# Patient Record
Sex: Male | Born: 1985 | Race: Black or African American | Hispanic: No | Marital: Married | Smoking: Never smoker
Health system: Southern US, Community
[De-identification: ages and names within clinical notes are randomized; demographics above are authoritative.]

## PROBLEM LIST (undated history)

## (undated) DIAGNOSIS — G4730 Sleep apnea, unspecified: Secondary | ICD-10-CM

## (undated) DIAGNOSIS — R030 Elevated blood-pressure reading, without diagnosis of hypertension: Secondary | ICD-10-CM

## (undated) DIAGNOSIS — E139 Other specified diabetes mellitus without complications: Secondary | ICD-10-CM

## (undated) DIAGNOSIS — M549 Dorsalgia, unspecified: Secondary | ICD-10-CM

## (undated) DIAGNOSIS — Z91018 Allergy to other foods: Secondary | ICD-10-CM

## (undated) HISTORY — DX: Dorsalgia, unspecified: M54.9

## (undated) HISTORY — DX: Allergy to other foods: Z91.018

## (undated) HISTORY — DX: Elevated blood-pressure reading, without diagnosis of hypertension: R03.0

## (undated) HISTORY — DX: Other specified diabetes mellitus without complications: E13.9

## (undated) HISTORY — DX: Sleep apnea, unspecified: G47.30

---
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
## 2004-04-16 ENCOUNTER — Emergency Department (HOSPITAL_COMMUNITY): Admission: EM | Admit: 2004-04-16 | Discharge: 2004-04-16 | Payer: Self-pay | Admitting: Emergency Medicine

## 2004-09-08 ENCOUNTER — Inpatient Hospital Stay (HOSPITAL_COMMUNITY): Admission: EM | Admit: 2004-09-08 | Discharge: 2004-09-12 | Payer: Self-pay | Admitting: Emergency Medicine

## 2004-09-12 ENCOUNTER — Ambulatory Visit: Payer: Self-pay | Admitting: Internal Medicine

## 2004-09-17 ENCOUNTER — Ambulatory Visit: Payer: Self-pay | Admitting: *Deleted

## 2005-04-14 ENCOUNTER — Ambulatory Visit: Payer: Self-pay | Admitting: Internal Medicine

## 2006-02-18 ENCOUNTER — Ambulatory Visit: Payer: Self-pay | Admitting: Internal Medicine

## 2006-03-04 ENCOUNTER — Ambulatory Visit: Payer: Self-pay | Admitting: Internal Medicine

## 2006-05-01 ENCOUNTER — Ambulatory Visit: Payer: Self-pay | Admitting: Internal Medicine

## 2006-07-31 ENCOUNTER — Ambulatory Visit: Payer: Self-pay | Admitting: Internal Medicine

## 2006-12-09 ENCOUNTER — Encounter (INDEPENDENT_AMBULATORY_CARE_PROVIDER_SITE_OTHER): Payer: Self-pay | Admitting: *Deleted

## 2007-03-08 ENCOUNTER — Ambulatory Visit: Payer: Self-pay | Admitting: Internal Medicine

## 2007-03-08 LAB — CONVERTED CEMR LAB
ALT: 21 units/L (ref 0–53)
Albumin: 4 g/dL (ref 3.5–5.2)
CO2: 25 meq/L (ref 19–32)
Cholesterol: 177 mg/dL (ref 0–200)
Glucose, Bld: 174 mg/dL — ABNORMAL HIGH (ref 70–99)
LDL Cholesterol: 89 mg/dL (ref 0–99)
Potassium: 4.1 meq/L (ref 3.5–5.3)
Sodium: 144 meq/L (ref 135–145)
Total Bilirubin: 0.3 mg/dL (ref 0.3–1.2)
Total Protein: 6.6 g/dL (ref 6.0–8.3)
Triglycerides: 267 mg/dL — ABNORMAL HIGH (ref <150)
VLDL: 53 mg/dL — ABNORMAL HIGH (ref 0–40)

## 2007-08-23 ENCOUNTER — Emergency Department (HOSPITAL_COMMUNITY): Admission: EM | Admit: 2007-08-23 | Discharge: 2007-08-23 | Payer: Self-pay | Admitting: Emergency Medicine

## 2008-04-13 ENCOUNTER — Emergency Department (HOSPITAL_COMMUNITY): Admission: EM | Admit: 2008-04-13 | Discharge: 2008-04-14 | Payer: Self-pay | Admitting: Emergency Medicine

## 2008-04-14 IMAGING — CR DG CHEST 2V
2 series · 2 of 2 positions shown · non-contrast
Comparison: None

CLINICAL DATA: Chest pain for 3 days, fever

CHEST - 2 VIEW

[w chest pa]
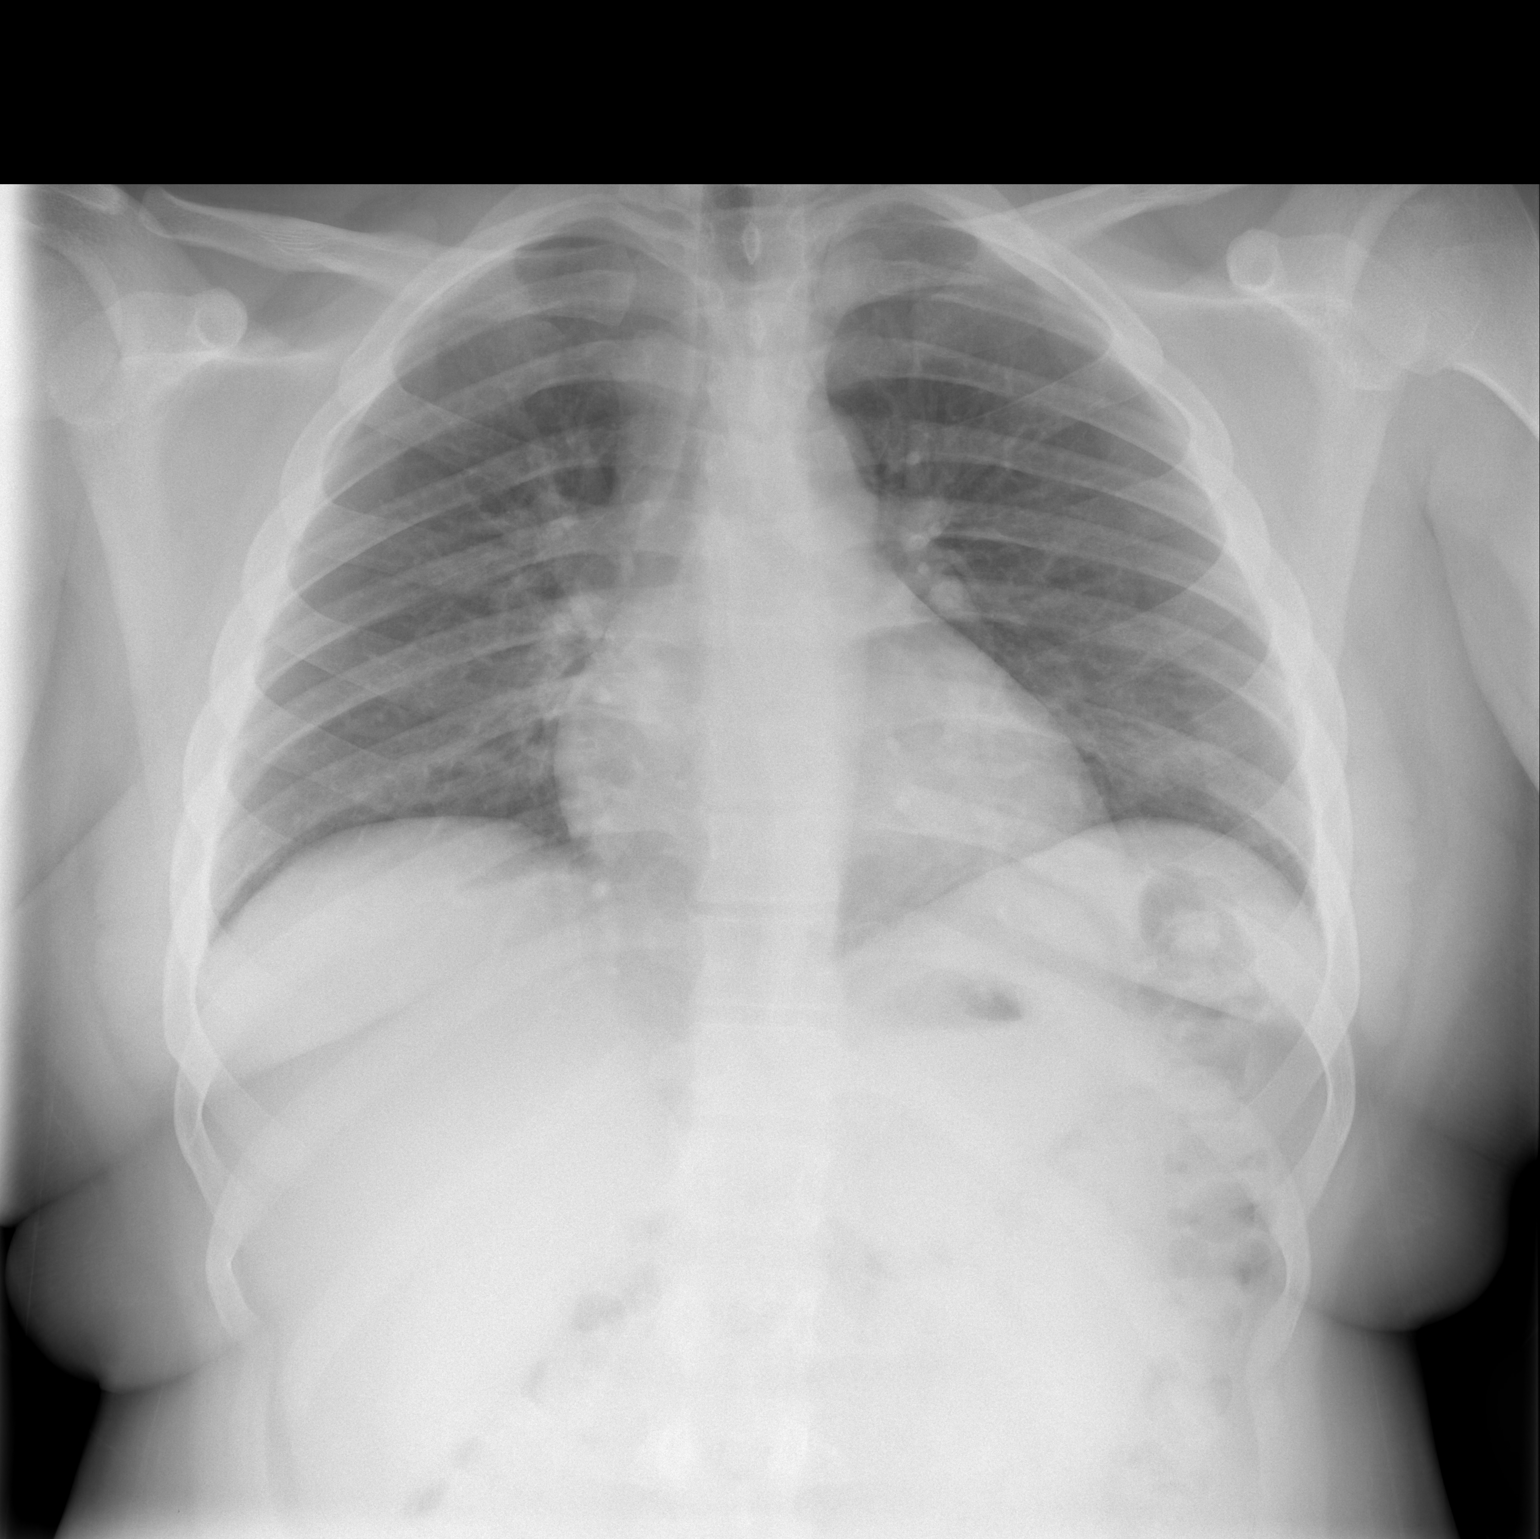

[w chest lat]
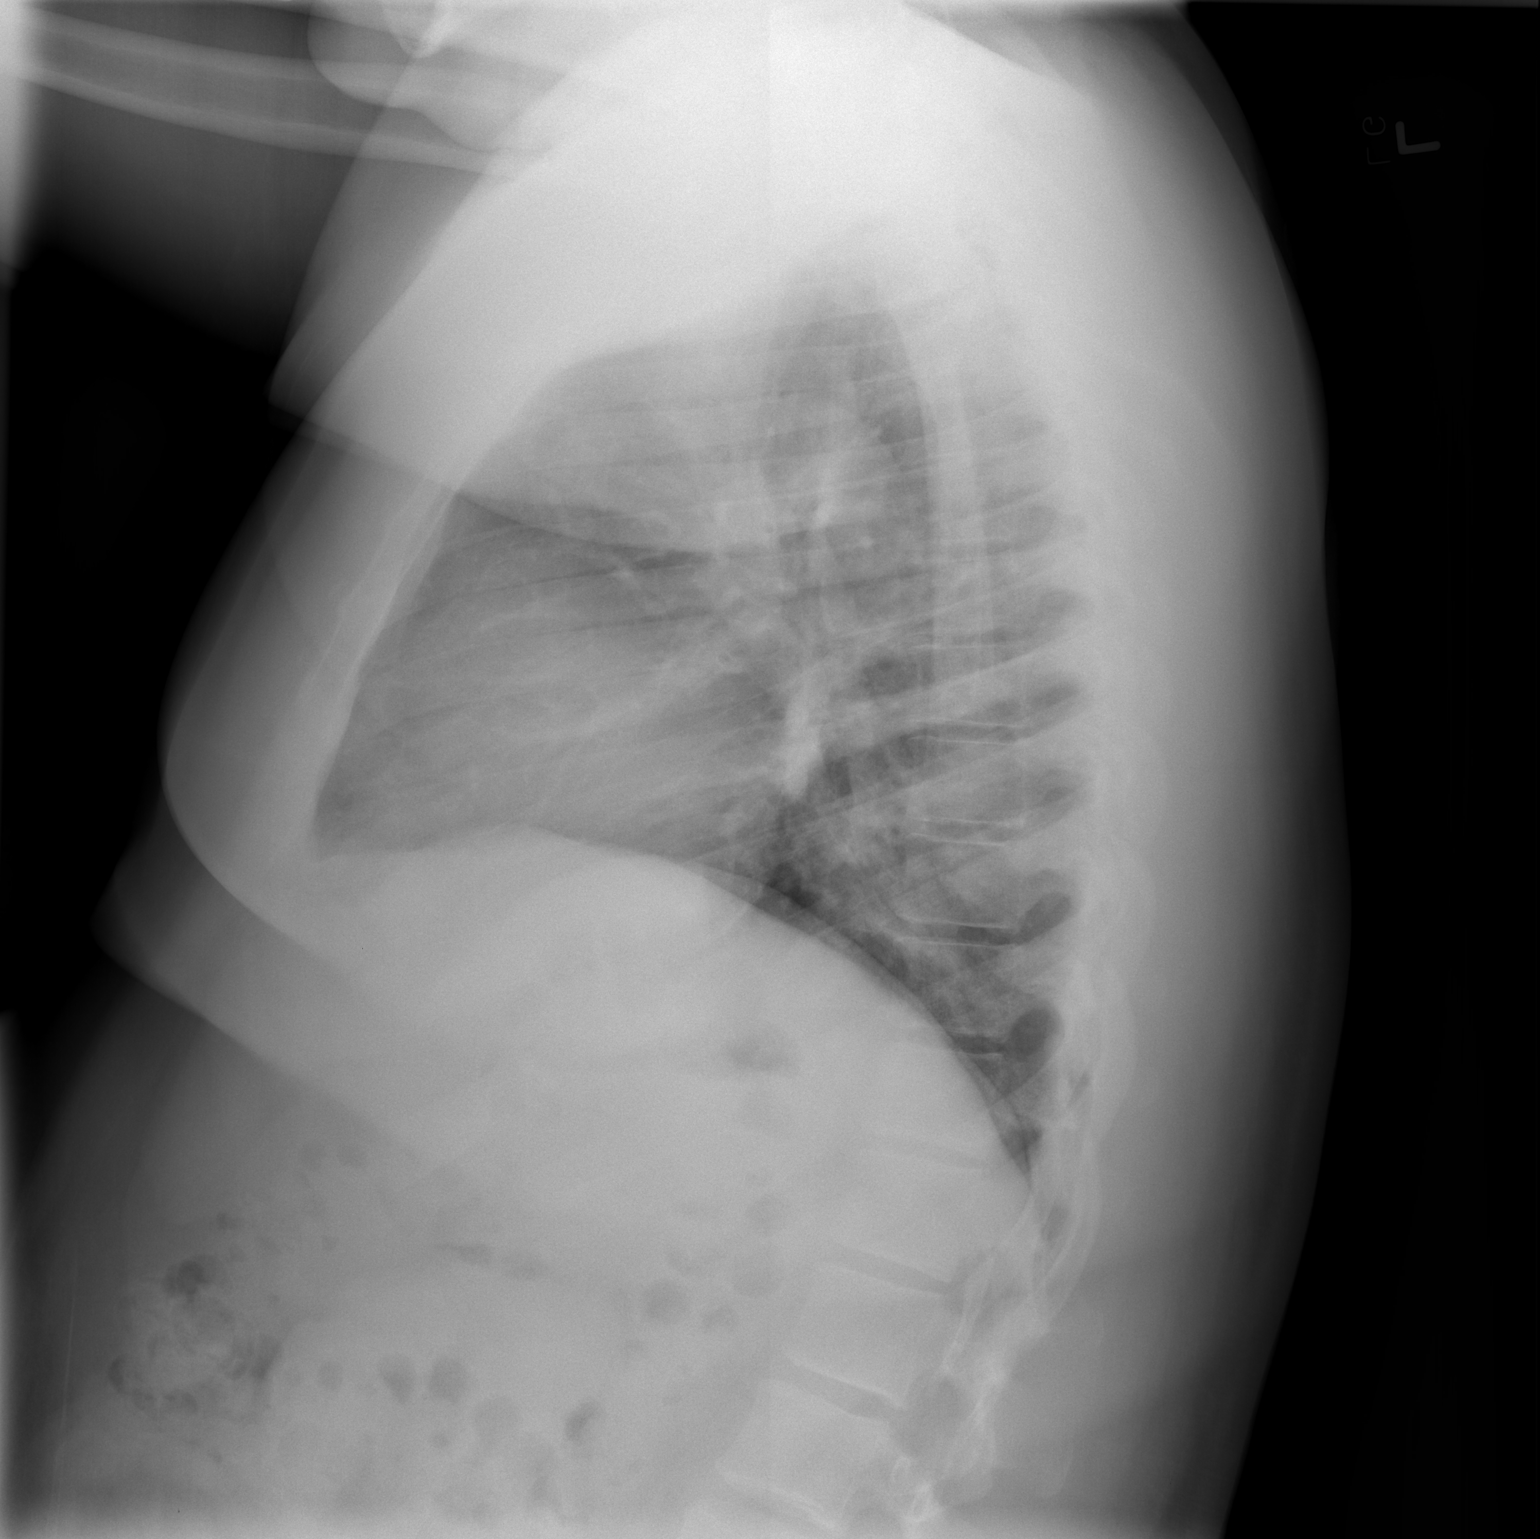

[2 of 2 positions shown; findings below may reference images not displayed]

FINDINGS: Normal mediastinum and cardiac silhouette.  Costophrenic
angles are clear.  No evidence of effusion, infiltrate or
pneumothorax.
IMPRESSION: No acute cardiopulmonary process.

## 2009-02-28 ENCOUNTER — Ambulatory Visit: Payer: Self-pay | Admitting: Internal Medicine

## 2009-03-26 ENCOUNTER — Telehealth (INDEPENDENT_AMBULATORY_CARE_PROVIDER_SITE_OTHER): Payer: Self-pay | Admitting: *Deleted

## 2009-04-11 ENCOUNTER — Ambulatory Visit: Payer: Self-pay | Admitting: Internal Medicine

## 2009-04-11 LAB — CONVERTED CEMR LAB
BUN: 13 mg/dL (ref 6–23)
Chloride: 104 meq/L (ref 96–112)
Cholesterol: 165 mg/dL (ref 0–200)
Glucose, Bld: 144 mg/dL — ABNORMAL HIGH (ref 70–99)
LDL Cholesterol: 103 mg/dL — ABNORMAL HIGH (ref 0–99)
Potassium: 4.3 meq/L (ref 3.5–5.3)
Sodium: 140 meq/L (ref 135–145)
Total CHOL/HDL Ratio: 3.8
Triglycerides: 93 mg/dL (ref <150)
VLDL: 19 mg/dL (ref 0–40)

## 2009-08-14 ENCOUNTER — Ambulatory Visit: Payer: Self-pay | Admitting: Internal Medicine

## 2009-12-14 ENCOUNTER — Encounter (INDEPENDENT_AMBULATORY_CARE_PROVIDER_SITE_OTHER): Payer: Self-pay | Admitting: *Deleted

## 2009-12-14 LAB — CONVERTED CEMR LAB
BUN: 11 mg/dL (ref 6–23)
Calcium: 9.3 mg/dL (ref 8.4–10.5)
Cholesterol: 169 mg/dL (ref 0–200)
Glucose, Bld: 142 mg/dL — ABNORMAL HIGH (ref 70–99)
Triglycerides: 67 mg/dL (ref <150)

## 2010-04-23 NOTE — Progress Notes (Signed)
Summary: triage/ears stopped 1  1/2 weeks...  Phone Note Call from Patient   Caller: Spouse Reason for Call: Talk to Nurse Summary of Call: patient's wife called stating patient has had a cold for 3 weeks and wants to be seen for his stopped up ears.. His ears have been stopped up for 1 1/2 weeks now.  She denies he is having pain or fever.  Initial call taken by: Conchita Paris,  March 26, 2009 12:13 PM

## 2010-07-08 LAB — GLUCOSE, CAPILLARY

## 2010-08-09 NOTE — Discharge Summary (Signed)
Aaron Woods, Aaron Woods               ACCOUNT NO.:  [id number]   MEDICAL RECORD NO.:  [id number]          PATIENT TYPE:  INP   LOCATION:  1504                         FACILITY:  [facility]   PHYSICIAN:  Danae Chen, M.D.DATE OF BIRTH:  1985-10-27   DATE OF ADMISSION:  09/08/2004  DATE OF DISCHARGE:  09/12/2004                                 DISCHARGE SUMMARY   The patient will be following up with [facility].   DISCHARGE DIAGNOSES:  1.  Newly diagnosed type 2 diabetes mellitus.  2.  Obesity.   DISCHARGE MEDICATIONS:  1.  Lantus 25 units twice daily.  2.  Aspirin 81 mg p.o. daily.   FOLLOW UP:  As noted with [facility] in one to two weeks.  The patient has  been given information in eligibility requirements for [facility] and  social work and diabetes coordinator have helped the patient with providing  a Glucometer and supplies.   BRIEF HOSPITAL COURSE:  The patient is a very pleasant 25 year old African  American male who was admitted with hyperglycemia, nonketotic with elevated  blood sugars.  Initially treated with IV fluids and insulin.  He did well  with blood sugars normalizing to the 145 range at the time of discharge.  He  did have to have his Lantus adjusted somewhat to bring his blood sugars  down.  He also had a cholesterol panel that was done with a total  cholesterol of 186, triglycerides of 133, HDL of 34 and LDL of 125.  The  patient did not have any problems with polyuria, polydipsia during his  hospital stay.  Other pertinent and discharge laboratories include a  hemoglobin A1c of 9.7.   PHYSICAL EXAMINATION AT THE TIME OF DISCHARGE:  GENERAL:  He is alert and  oriented.  VITAL SIGNS:  Afebrile.  Blood pressure is 136/76.  ABG of 145, sating 98%  on room air.  LUNGS:  Clear.  HEART:  Rate is regular.  ABDOMEN:  Soft.  No peripheral edema.  HEENT:  Gross visual fields intact.   DISCHARGE LABORATORIES:  BUN of 8, creatinine of 1.0, potassium of  3.7,  sodium of 137.   CONDITION ON DISCHARGE:  Improved.   FOLLOW UP:  The patient is to follow up with [facility] as noted.  Has a  Glucometer and supplies and is comfortable giving himself Lantus injections.       RLK/MEDQ  D:  09/12/2004  T:  09/12/2004  Job:  [id number]   cc:   Tresa Endo L. Philipp Deputy, M.D.  [phone] [address]  [place] [address]  Fax: [phone]

## 2010-08-09 NOTE — H&P (Signed)
NAME:  DOMINIQ, FONTAINE NO.:  [id number]   MEDICAL RECORD NO.:  [id number]          PATIENT TYPE:  INP   LOCATION:  1504                         FACILITY:  [facility]   PHYSICIAN:  Hettie Holstein, D.O.    DATE OF BIRTH:  1986/01/26   DATE OF ADMISSION:  09/08/2004  DATE OF DISCHARGE:                                HISTORY & PHYSICAL   PRIMARY CARE PHYSICIAN:  Unassigned.   CHIEF COMPLAINT:  Elevated blood sugar at home.   HISTORY OF PRESENT ILLNESS:  Mr. Antonie Borjon is a pleasant 25 year old  African American male who was in his usual state of health until the past  couple of days. He has had increased urinary frequency and feeling tired. He  has had about a seven pound weight loss over the past couple of weeks,  unintentional, and his mom check his blood sugar at home and stated that it  was quite elevated. He presented to the emergency department and was noted  to have blood sugar greater than 600. Otherwise, he denied any other  complaints. He has had some polyuria, polydipsia, increased fatigue.   He has not had continuous medical care in the outpatient setting. He has not  seen a physician and not been told that he has diabetes before.   PAST MEDICAL HISTORY:  History of dislocated shoulder on the left, otherwise  unremarkable.   MEDICATIONS:  The patient takes Tylenol over-the-counter in addition to  hydrocodone prescribed by [facility] once in the past.   ALLERGIES:  No known drug allergies.   SOCIAL HISTORY:  The patient denies tobacco or alcohol. He is currently  unemployed. He is married without children.   FAMILY HISTORY:  His mother is age 67 and was diagnosed with diabetes  several years ago. He is unaware of the history of his father. He has five  brothers and one with diabetes as well.   REVIEW OF SYMPTOMS:  He has had some hot spells. Otherwise, he denies any  other complaints. He has some shortness of breath with  activities such as  playing basketball which has been the case since high school. He has had  increased urinary frequency, some swelling of his lower extremities. He  denies any chest pain, fevers, or chills. Otherwise, has no other complaints  and his further review of systems is negative.   PHYSICAL EXAMINATION:  VITAL SIGNS:  Stable in the emergency department.  Blood pressure 198/86, temperature 98.7, heart rate 100, respirations 20. O2  saturation on room air 97%.  GENERAL:  The patient is a well-developed 25 year old male in no acute  distress. Sitting up on the side of the bed and crunching ice. He is in no  apparent distress.  HEENT:  His head is normocephalic and atraumatic. Extraocular movements  intact. Oropharynx is clear.  NECK:  Supple. Nontender. No palpable thyromegaly or mass.  CARDIOVASCULAR:  Normal S1 and S2 without murmur or gallop.  LUNGS:  Clear to auscultation bilaterally.  ABDOMEN:  Obese. There is no tenderness.  EXTREMITIES:  Reveal trace bipedal edema.  NEUROLOGICAL:  There is no focal neurologic deficit. He is euthymic and his  affect is stable.   LABORATORY DATA:  WBC of 10.7, hemoglobin 12, platelet count of 341,000, MCV  of 79. Sodium 133, potassium 3.4, BUN 14, creatinine 1.3, glucose 691. ALT  within normal limits. Alkaline phosphate within normal limits. Albumin 3.3.   ASSESSMENT:  1.  New onset of diabetes likely type 2.  2.  Weight loss.  3.  Obesity.  4.  Low albumin.   PLAN:  At this time, we are going to admit Mr. Kessen to medical floor for  diabetic education and teaching. He has no outpatient care Ronnell Makarewicz. His CBG  is greater than 600. We will place him on sliding scale with Lantus and  establish follow up with [facility]. Ask diabetic educator to provide  instructions in outpatient follow-up appointment. We will check a fasting  lipid panel as well as a TSH and follow his course clinically. There are no  focal signs of infection. He  appears to be medically stable and he may be  stable for discharge once outpatient follow-up can be established.       ESS/MEDQ  D:  09/08/2004  T:  09/09/2004  Job:  [id number]

## 2011-02-24 ENCOUNTER — Emergency Department (HOSPITAL_COMMUNITY)
Admission: EM | Admit: 2011-02-24 | Discharge: 2011-02-24 | Disposition: A | Discharge status: Home / Self Care | Payer: Self-pay | Source: Home / Self Care | Attending: Emergency Medicine | Admitting: Emergency Medicine

## 2011-02-24 ENCOUNTER — Emergency Department (INDEPENDENT_AMBULATORY_CARE_PROVIDER_SITE_OTHER): Payer: Self-pay

## 2011-02-24 DIAGNOSIS — S93409A Sprain of unspecified ligament of unspecified ankle, initial encounter: Secondary | ICD-10-CM

## 2011-02-24 DIAGNOSIS — S93401A Sprain of unspecified ligament of right ankle, initial encounter: Secondary | ICD-10-CM

## 2011-02-24 IMAGING — CR DG ANKLE COMPLETE 3+V*R*
3 series · 3 of 3 positions shown · non-contrast
Comparison: None.

CLINICAL DATA: Right ankle pain following a fall.

RIGHT ANKLE - COMPLETE 3+ VIEW

[view not recorded (1 of 3)]
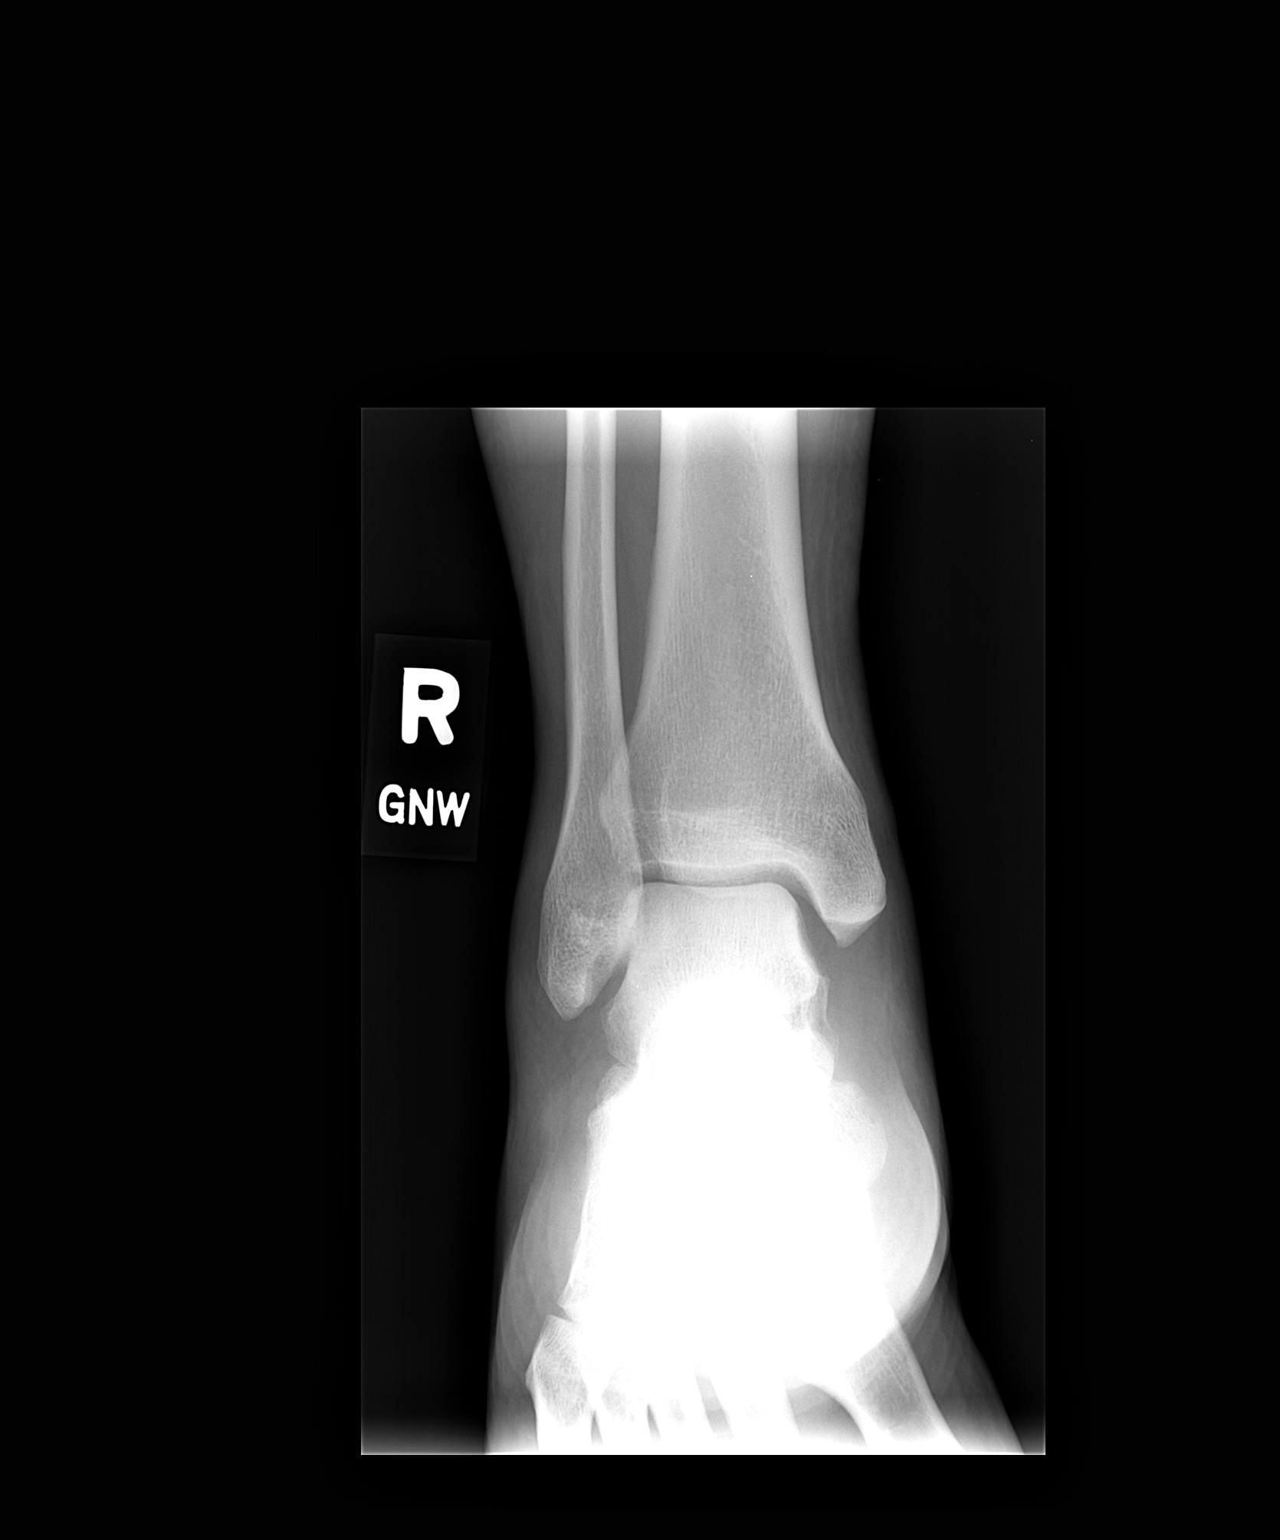

[view not recorded (2 of 3)]
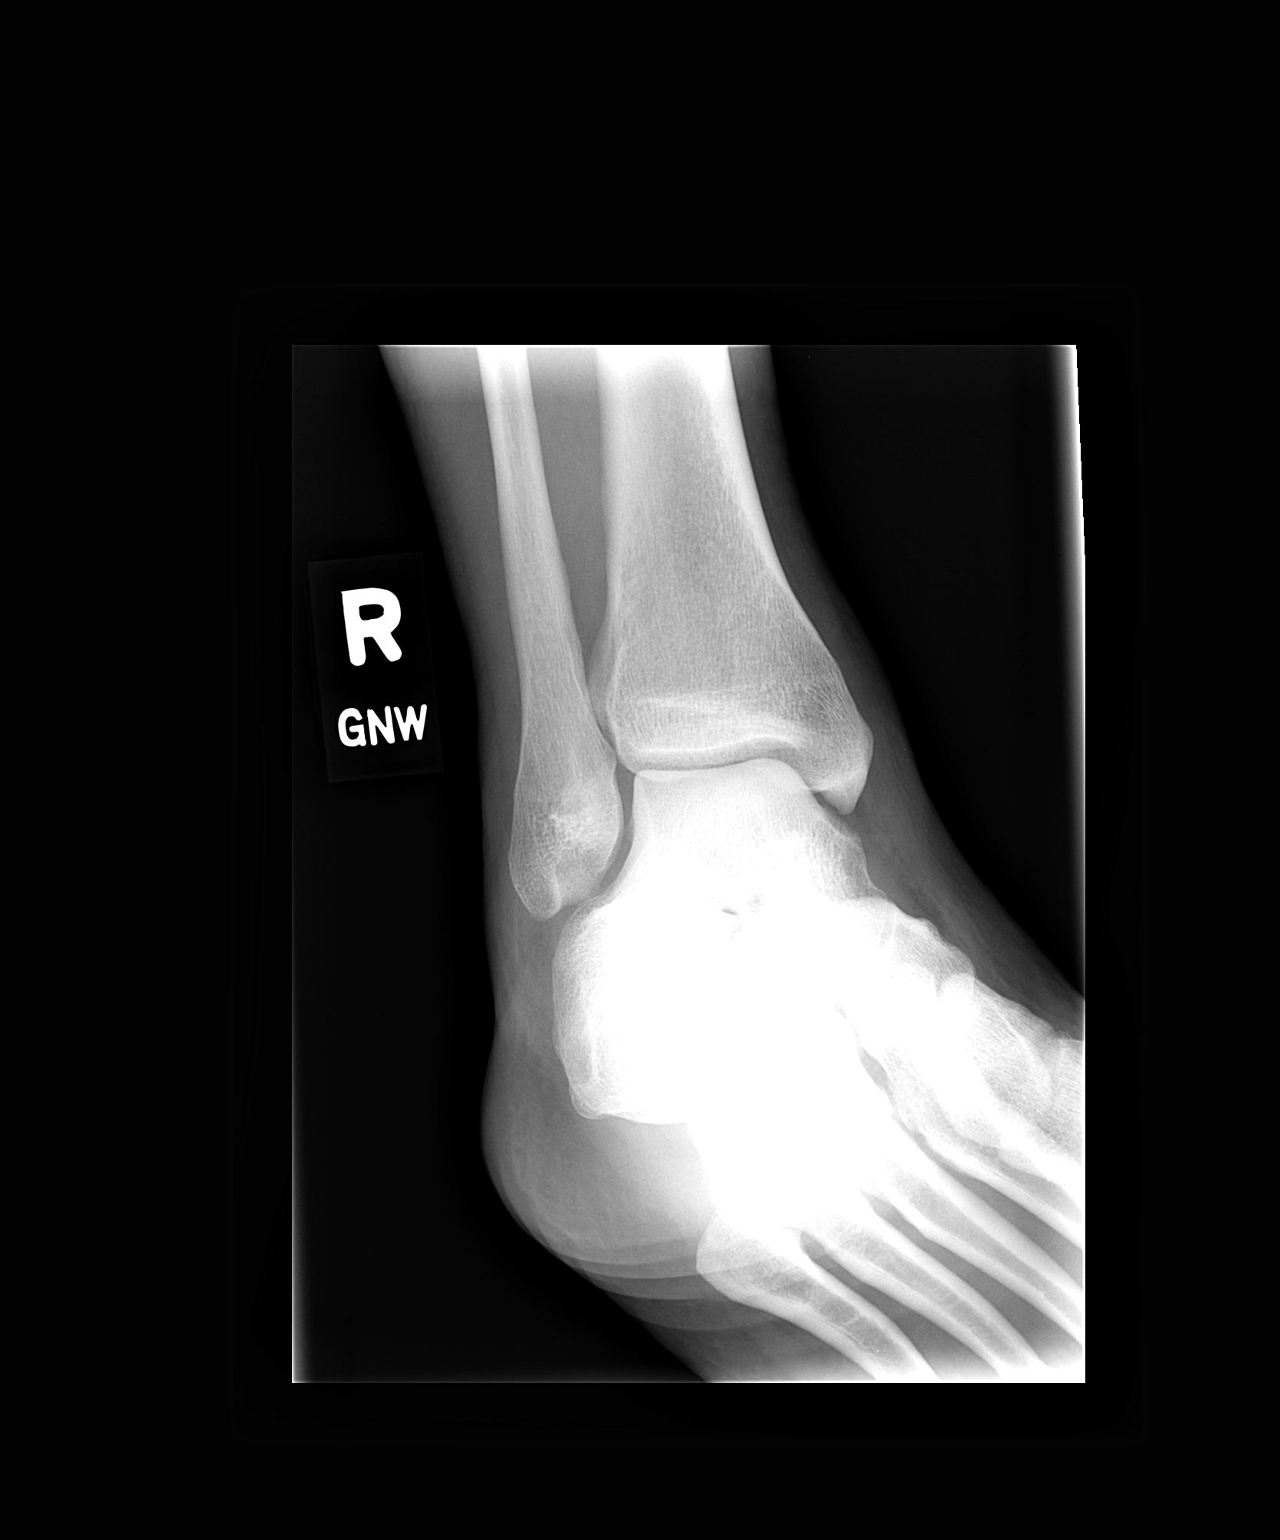

[view not recorded (3 of 3)]
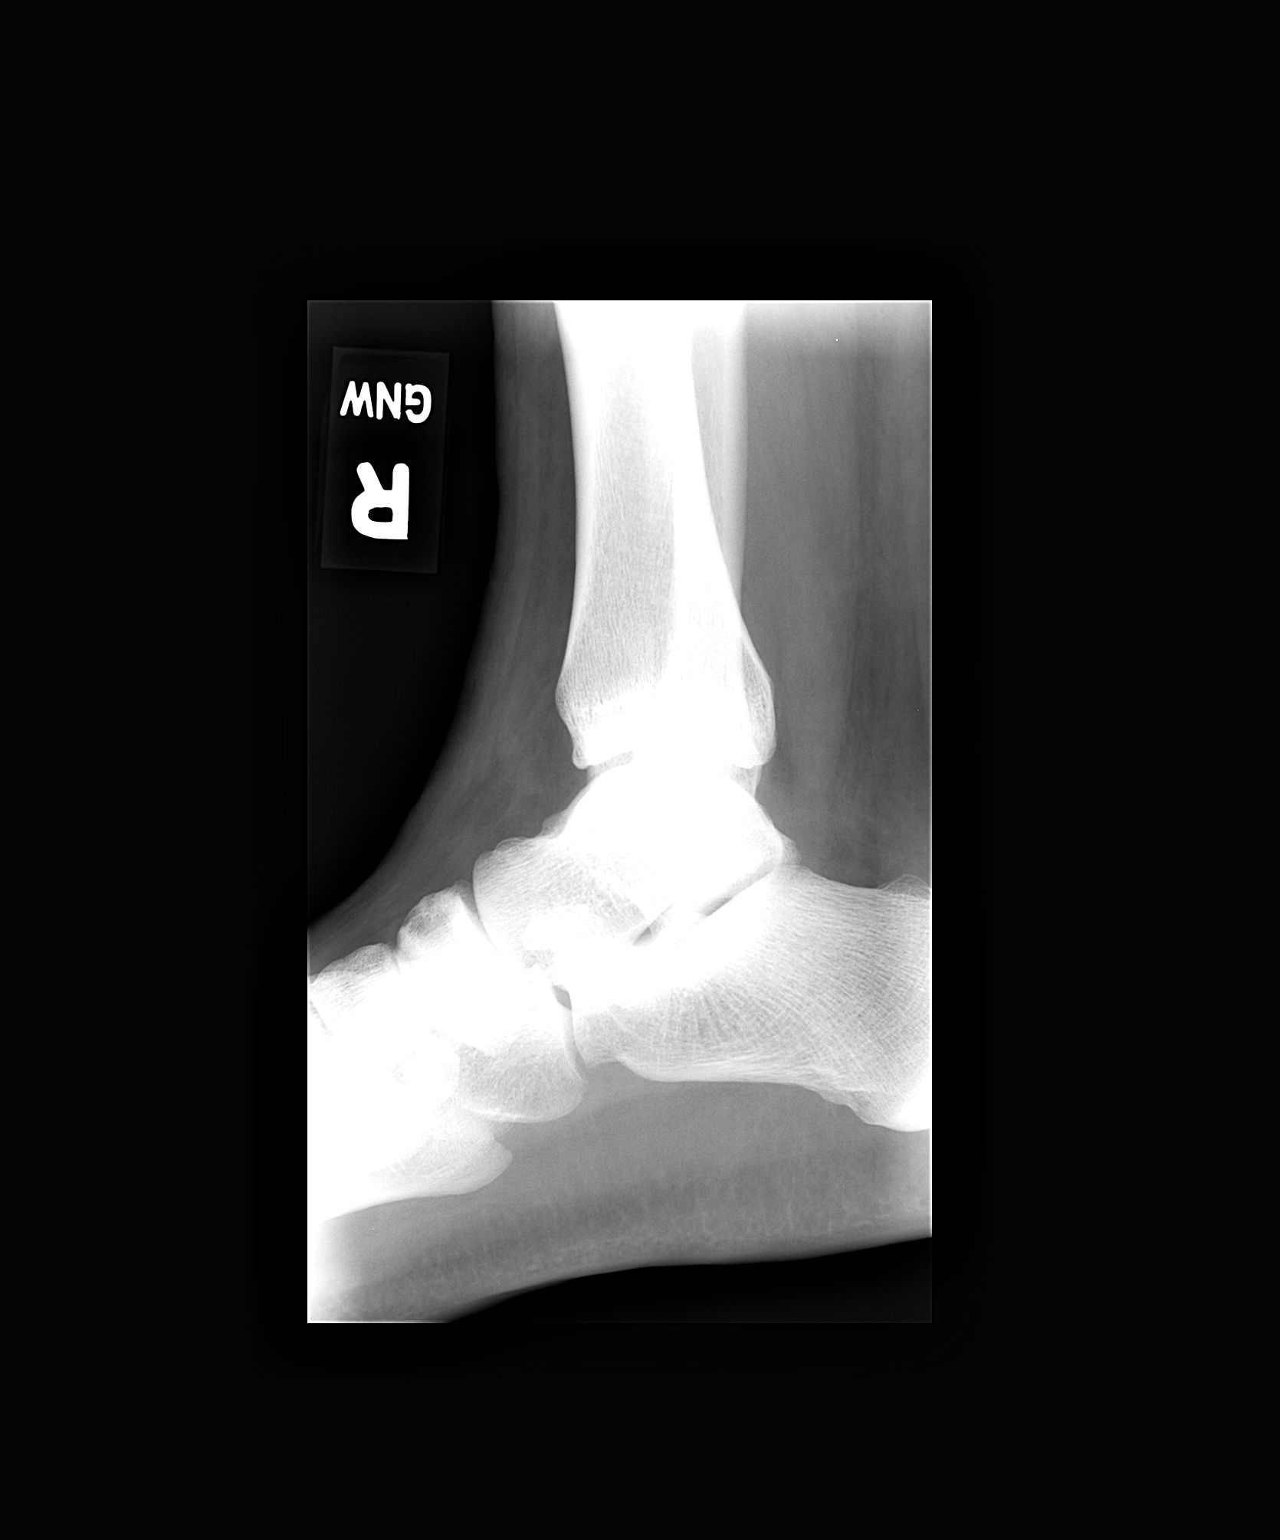

[3 of 3 positions shown; findings below may reference images not displayed]

FINDINGS: Mild diffuse soft tissue swelling.  Possible small
effusion.  No fracture.
IMPRESSION: No fracture.

## 2011-02-24 MED ORDER — ANTIPYRINE-BENZOCAINE 5.4-1.4 % OT SOLN: 3.0000 [drp] | Freq: Once | OTIC | Status: DC

## 2011-02-24 NOTE — ED Provider Notes (Signed)
History     CSN: [id number] Arrival date & time: 02/24/2011  8:34 PM   First MD Initiated Contact with Patient 02/24/11 1908      Chief Complaint  Patient presents with  . Ankle Pain    Today pt. played basketball and rolled ankle.  noted swelling on right ankle.  good pulses.     (Consider location/radiation/quality/duration/timing/severity/associated sxs/prior treatment) HPI  Past Medical History  Diagnosis Date  . Diabetes mellitus     History reviewed. No pertinent past surgical history.  History reviewed. No pertinent family history.  History  Substance Use Topics  . Smoking status: Never Smoker   . Smokeless tobacco: Never Used  . Alcohol Use: No      Review of Systems  Allergies  Review of patient's allergies indicates no known allergies.  Home Medications   Current Outpatient Rx  Name Route Sig Dispense Refill  . METFORMIN HCL 1000 MG PO TABS Oral Take 1,000 mg by mouth 2 (two) times daily with a meal.        BP 103/66  Pulse 82  Temp(Src) 98.1 F (36.7 C) (Oral)  Resp 16  SpO2 98%  Physical Exam  ED Course  Procedures (including critical care time)  Labs Reviewed - No data to display Dg Ankle Complete Right  02/24/2011  *RADIOLOGY REPORT*  Clinical Data: Right ankle pain following a fall.  RIGHT ANKLE - COMPLETE 3+ VIEW  Comparison: None.  Findings: Mild diffuse soft tissue swelling.  Possible small effusion.  No fracture.  IMPRESSION: No fracture.  Original Report Authenticated By: Darrol Angel, M.D.     1. Right ankle sprain       MDM          Roque Lias, MD 02/24/11 2228

## 2011-02-24 NOTE — ED Provider Notes (Signed)
25 year old male who injured his right ankle today at noon. He was playing basketball when he suffered an inversion injury to his right foot. He did finish the basketball game but soon developed limping and inability to walk.  At this time his ankle and foot swelled significantly. His wife applied in Ace wrap that has decreased the swelling and gave him 800 mg of ibuprofen which did help.   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 Also significant for diabetes type 2 No history of orthopedic surgery to his right foot PMH reviewed.  ROS as above otherwise neg Medications reviewed. No current facility-administered medications for this encounter.   Current Outpatient Prescriptions  Medication Sig Dispense Refill  . metFORMIN (GLUCOPHAGE) 1000 MG tablet Take 1,000 mg by mouth 2 (two) times daily with a meal.          Exam:  BP 103/66  Pulse 82  Temp(Src) 98.1 F (36.7 C) (Oral)  Resp 16  SpO2 98% Gen: Well NAD MSK:  Right ankle shows swelling of the subtalar joint and along the lateral fifth proximal fifth metatarsal. Tenderness to palpation over the proximal fifth metatarsal. Tender over the insertion of the anterior talofibular ligament.  Nontender over the lateral malleolus Normal ankle motion.  Sensation is intact.  A/P Right lateral ankle sprain. Plan: RICE therapy + crutches initially. Will also provide ASO brace and rehab instructions.  F/U with orthopedics if no improvement in 1 week.

## 2011-02-24 NOTE — ED Notes (Signed)
Today pt. played basketball and rolled ankle.  noted swelling on right ankle.  good pulses.

## 2011-02-25 NOTE — ED Provider Notes (Signed)
Medical screening examination/treatment/procedure(s) were performed by a resident physician and as supervising physician I was immediately available for consultation/collaboration.  Hillery Hunter, MD 02/25/11 [phone]

## 2011-05-20 ENCOUNTER — Encounter: Payer: Self-pay | Admitting: Physician Assistant

## 2013-09-09 ENCOUNTER — Emergency Department (HOSPITAL_COMMUNITY)
Admission: EM | Admit: 2013-09-09 | Discharge: 2013-09-09 | Disposition: A | Discharge status: Home / Self Care | Payer: Self-pay | Attending: Emergency Medicine | Admitting: Emergency Medicine

## 2013-09-09 ENCOUNTER — Encounter (HOSPITAL_COMMUNITY): Payer: Self-pay | Admitting: Emergency Medicine

## 2013-09-09 DIAGNOSIS — J029 Acute pharyngitis, unspecified: Secondary | ICD-10-CM | POA: Insufficient documentation

## 2013-09-09 DIAGNOSIS — H73829 Atrophic nonflaccid tympanic membrane, unspecified ear: Secondary | ICD-10-CM | POA: Insufficient documentation

## 2013-09-09 DIAGNOSIS — E119 Type 2 diabetes mellitus without complications: Secondary | ICD-10-CM | POA: Insufficient documentation

## 2013-09-09 DIAGNOSIS — Z79899 Other long term (current) drug therapy: Secondary | ICD-10-CM | POA: Insufficient documentation

## 2013-09-09 LAB — RAPID STREP SCREEN (MED CTR MEBANE ONLY): Streptococcus, Group A Screen (Direct): NEGATIVE

## 2013-09-09 MED ORDER — GUAIFENESIN 100 MG/5ML PO LIQD: 100.0000 mg | ORAL | Status: DC | PRN

## 2013-09-09 MED ORDER — IBUPROFEN 800 MG PO TABS
800.0000 mg | ORAL_TABLET | Freq: Once | ORAL | Status: AC
  Administered 2013-09-09: 800 mg via ORAL
  Filled 2013-09-09: qty 1

## 2013-09-09 NOTE — ED Notes (Signed)
Patient is alert and oriented x3.  He is complaining of a sore throat and fever that started 2 days ago. Currently he rates his pain 6 of 10.

## 2013-09-09 NOTE — Discharge Instructions (Signed)

## 2013-09-09 NOTE — ED Provider Notes (Signed)
Medical screening examination/treatment/procedure(s) were performed by non-physician practitioner and as supervising physician I was immediately available for consultation/collaboration.   EKG Interpretation None       Stephen Kohut, MD 09/09/13 0957 

## 2013-09-09 NOTE — ED Provider Notes (Signed)
CSN: [id number]     Arrival date & time 09/09/13  0615 History   First MD Initiated Contact with Patient 09/09/13 [phone]     Chief Complaint  Patient presents with  . Sore Throat     (Consider location/radiation/quality/duration/timing/severity/associated sxs/prior Treatment) HPI Comments: Patient is a 28 year old male with history of diabetes who presents today with 3 days of gradual worsening sore throat and fever. Symptoms began with runny nose, cough. He has not taken anything for his symptoms. He is not having any difficulty swallowing. He reports that he is here today because he saw a white spot on the back of his throat. No nausea, vomiting, abdominal pain.   Patient is a 28 y.o. male presenting with pharyngitis. The history is provided by the patient. No language interpreter was used.  Sore Throat Associated symptoms include congestion, a fever and a sore throat. Pertinent negatives include no abdominal pain, chest pain, nausea or vomiting.    Past Medical History  Diagnosis Date  . Diabetes mellitus    History reviewed. No pertinent past surgical history. History reviewed. No pertinent family history. History  Substance Use Topics  . Smoking status: Never Smoker   . Smokeless tobacco: Never Used  . Alcohol Use: No    Review of Systems  Constitutional: Positive for fever.  HENT: Positive for congestion, sinus pressure and sore throat. Negative for ear pain and trouble swallowing.   Respiratory: Negative for shortness of breath.   Cardiovascular: Negative for chest pain.  Gastrointestinal: Negative for nausea, vomiting and abdominal pain.  All other systems reviewed and are negative.     Allergies  Review of patient's allergies indicates no known allergies.  Home Medications   Prior to Admission medications   Medication Sig Start Date End Date Taking? Authorizing Provider  metFORMIN (GLUCOPHAGE) 1000 MG tablet Take 1,000 mg by mouth 2 (two) times daily with a  meal.      Historical Provider, MD   BP 129/78  Pulse 80  Temp(Src) 99 F (37.2 C) (Oral)  Resp 14  SpO2 98% Physical Exam  Nursing note and vitals reviewed. Constitutional: He is oriented to person, place, and time. He appears well-developed and well-nourished. No distress.  HENT:  Head: Normocephalic and atraumatic.  Right Ear: External ear normal. No mastoid tenderness. Tympanic membrane is retracted. Tympanic membrane is not erythematous.  Left Ear: External ear normal. No mastoid tenderness. Tympanic membrane is retracted. Tympanic membrane is not erythematous.  Nose: Nose normal.  Mouth/Throat: Uvula is midline. No trismus in the jaw. No uvula swelling. Posterior oropharyngeal erythema present.  No tonsillar exudate visualized on exam.   Eyes: Conjunctivae are normal.  Neck: Normal range of motion. No tracheal deviation present.  Cardiovascular: Normal rate, regular rhythm and normal heart sounds.   Pulmonary/Chest: Effort normal and breath sounds normal. No stridor.  Abdominal: Soft. He exhibits no distension. There is no tenderness.  Musculoskeletal: Normal range of motion.  Neurological: He is alert and oriented to person, place, and time.  Skin: Skin is warm and dry. He is not diaphoretic.  Psychiatric: He has a normal mood and affect. His behavior is normal.    ED Course  Procedures (including critical care time) Labs Review Labs Reviewed  RAPID STREP SCREEN  CULTURE, GROUP A STREP    Imaging Review No results found.   EKG Interpretation None      MDM   Final diagnoses:  Viral pharyngitis   Pt afebrile without tonsillar exudate, negative strep. Presents  with mild cervical lymphadenopathy, & dysphagia; diagnosis of viral pharyngitis. No abx indicated. DC w symptomatic tx for pain  Pt does not appear dehydrated, but did discuss importance of water rehydration. Presentation non concerning for PTA or infxn spread to soft tissue. No trismus or uvula deviation.  Specific return precautions discussed. Pt able to drink water in ED without difficulty with intact air way. Recommended PCP follow up.     Mora BellmanHannah S Verdie Wilms, PA-C 09/09/13 [phone]

## 2013-09-11 LAB — CULTURE, GROUP A STREP

## 2016-05-03 ENCOUNTER — Emergency Department
Admission: EM | Admit: 2016-05-03 | Discharge: 2016-05-03 | Disposition: A | Discharge status: Home / Self Care | Payer: Self-pay | Attending: Emergency Medicine | Admitting: Emergency Medicine

## 2016-05-03 DIAGNOSIS — E119 Type 2 diabetes mellitus without complications: Secondary | ICD-10-CM | POA: Insufficient documentation

## 2016-05-03 DIAGNOSIS — J111 Influenza due to unidentified influenza virus with other respiratory manifestations: Secondary | ICD-10-CM | POA: Insufficient documentation

## 2016-05-03 DIAGNOSIS — Z7984 Long term (current) use of oral hypoglycemic drugs: Secondary | ICD-10-CM | POA: Insufficient documentation

## 2016-05-03 MED ORDER — FLUTICASONE PROPIONATE 50 MCG/ACT NA SUSP: 2.0000 | Freq: Every day | NASAL | 0 refills | Status: DC

## 2016-05-03 MED ORDER — GUAIFENESIN-CODEINE 100-10 MG/5ML PO SYRP: 5.0000 mL | ORAL_SOLUTION | Freq: Three times a day (TID) | ORAL | 0 refills | Status: AC | PRN

## 2016-05-03 NOTE — ED Triage Notes (Signed)
Pt states fever and flu like symptoms since Tuesday. Pt states has generalized body aches, cough, sore throat, nasal congestion. Pt with unlabored resps and appears in no acute distress in triage.

## 2016-05-03 NOTE — ED Notes (Signed)
Pt verbalizes understanding of discharge instructions.

## 2016-05-03 NOTE — ED Provider Notes (Signed)
[facility] Emergency Department Provider Note  ____________________________________________  Time seen: Approximately 9:58 PM  I have reviewed the triage vital signs and the nursing notes.   HISTORY  Chief Complaint Fever; Cough; Nasal Congestion; and Influenza    HPI Aaron Woods is a 31 y.o. male that presents to the emergency department with 4 days of fever, body aches, headache, nasal congestion, cough. Patient is drinking plenty of liquids but does not have an appetite. He has taken over-the-counter flu medication without relief. Patient is urinating normally and having normal bowel movements. Patient denies shortness of breath, chest pain, nausea, vomiting, abdominal pain, diarrhea, constipation.   Past Medical History:  Diagnosis Date  . Diabetes mellitus     Patient Active Problem List   Diagnosis Date Noted  . Right ankle sprain 02/24/2011    No past surgical history on file.  Prior to Admission medications   Medication Sig Start Date End Date Taking? Authorizing Provider  fluticasone (FLONASE) 50 MCG/ACT nasal spray Place 2 sprays into both nostrils daily. 05/03/16 05/03/17  Enid DerryAshley Nicholus Chandran, PA-C  guaiFENesin (ROBITUSSIN) 100 MG/5ML liquid Take 5-10 mLs (100-200 mg total) by mouth every 4 (four) hours as needed for cough. 09/09/13   Junious SilkHannah Merrell, PA-C  guaiFENesin-codeine (ROBITUSSIN AC) 100-10 MG/5ML syrup Take 5 mLs by mouth 3 (three) times daily as needed for cough. 05/03/16 05/05/16  Enid DerryAshley Leesha Veno, PA-C  metFORMIN (GLUCOPHAGE) 1000 MG tablet Take 1,000 mg by mouth 2 (two) times daily with a meal.      Historical Provider, MD    Allergies Patient has no known allergies.  No family history on file.  Social History Social History  Substance Use Topics  . Smoking status: Never Smoker  . Smokeless tobacco: Never Used  . Alcohol use No     Review of Systems  Eyes: No visual changes. No discharge. ENT: Positive for congestion and  rhinorrhea. Cardiovascular: No chest pain. Respiratory: Positive for cough. No SOB. Gastrointestinal: No abdominal pain.  No nausea, no vomiting.  No diarrhea.  No constipation. Musculoskeletal: Positive for musculoskeletal pain. Skin: Negative for rash, abrasions, lacerations, ecchymosis. Neurological: Positive for headaches.   ____________________________________________   PHYSICAL EXAM:  VITAL SIGNS: ED Triage Vitals  Enc Vitals Group     BP 05/03/16 2035 135/81     Pulse Rate 05/03/16 2035 90     Resp 05/03/16 2035 16     Temp 05/03/16 2035 100.2 F (37.9 C)     Temp Source 05/03/16 2035 Oral     SpO2 05/03/16 2035 100 %     Weight 05/03/16 2035 210 lb (95.3 kg)     Height 05/03/16 2035 5\' 10"  (1.778 m)     Head Circumference --      Peak Flow --      Pain Score 05/03/16 2036 8     Pain Loc --      Pain Edu? --      Excl. in GC? --      Constitutional: Alert and oriented. Well appearing and in no acute distress. Eyes: Conjunctivae are normal. PERRL. EOMI. No discharge. Head: Atraumatic. ENT: No frontal and maxillary sinus tenderness.      Ears: Tympanic membranes pearly gray with good landmarks. No discharge.      Nose: Mild congestion/rhinnorhea.      Mouth/Throat: Mucous membranes are moist. Oropharynx non-erythematous. Tonsils not enlarged. No exudates. Uvula midline. Neck: No stridor.   Hematological/Lymphatic/Immunilogical: No cervical lymphadenopathy. Cardiovascular: Normal rate, regular  rhythm. Good peripheral circulation. Respiratory: Normal respiratory effort without tachypnea or retractions. Lungs CTAB. Good air entry to the bases with no decreased or absent breath sounds. Gastrointestinal: Bowel sounds 4 quadrants. Soft and nontender to palpation. No guarding or rigidity. No palpable masses. No distention. Musculoskeletal: Full range of motion to all extremities. No gross deformities appreciated. Neurologic:  Normal speech and language. No gross focal  neurologic deficits are appreciated.  Skin:  Skin is warm, dry and intact. No rash noted. Psychiatric: Mood and affect are normal. Speech and behavior are normal. Patient exhibits appropriate insight and judgement.   ____________________________________________   LABS (all labs ordered are listed, but only abnormal results are displayed)  Labs Reviewed - No data to display ____________________________________________  EKG   ____________________________________________  RADIOLOGY  No results found.  ____________________________________________    PROCEDURES  Procedure(s) performed:    Procedures    Medications - No data to display   ____________________________________________   INITIAL IMPRESSION / ASSESSMENT AND PLAN / ED COURSE  Pertinent labs & imaging results that were available during my care of the patient were reviewed by me and considered in my medical decision making (see chart for details).  Review of the [place] CSRS was performed in accordance of the [facility] prior to dispensing any controlled drugs.     Patient's diagnosis is consistent with Influenza. Vital signs and exam are reassuring. Patient appears well in ED and is comfortable going home. Work note was provided. Patient will be discharged home with prescriptions for flonase and robitussin. Patient is to follow up with PCP as needed or otherwise directed. Patient is given ED precautions to return to the ED for any worsening or new symptoms.     ____________________________________________  FINAL CLINICAL IMPRESSION(S) / ED DIAGNOSES  Final diagnoses:  Influenza      NEW MEDICATIONS STARTED DURING THIS VISIT:  Discharge Medication List as of 05/03/2016 10:28 PM    START taking these medications   Details  fluticasone (FLONASE) 50 MCG/ACT nasal spray Place 2 sprays into both nostrils daily., Starting Sat 05/03/2016, Until Sun 05/03/2017, Print    guaiFENesin-codeine (ROBITUSSIN AC) 100-10  MG/5ML syrup Take 5 mLs by mouth 3 (three) times daily as needed for cough., Starting Sat 05/03/2016, Until Mon 05/05/2016, Print            This chart was dictated using voice recognition software/Dragon. Despite best efforts to proofread, errors can occur which can change the meaning. Any change was purely unintentional.    Enid Derry, PA-C 05/04/16 0015    Phineas Semen, MD 05/04/16 [phone]

## 2016-05-03 NOTE — ED Notes (Signed)
Pt reprots he has been feeling sick since Tuesday body aches, fever, headaches, denies any n/v/d. Pt talks in complete sentences no respiratory distress noted. OTC medication cold med and tylenol. Pt denies any other symptom at present

## 2018-10-18 ENCOUNTER — Telehealth: Payer: Self-pay | Admitting: General Practice

## 2018-10-18 NOTE — Telephone Encounter (Signed)
Patient is interested in becoming a new patient with [employer]. [phone]

## 2018-10-19 NOTE — Telephone Encounter (Signed)
Please see if pt would like to see another provider at our practice or they can follow her to her new practice.   Please give pt options and schedule pt depending on what they would like to do.

## 2018-11-02 ENCOUNTER — Ambulatory Visit (INDEPENDENT_AMBULATORY_CARE_PROVIDER_SITE_OTHER): Payer: 59 | Admitting: Emergency Medicine

## 2018-11-02 ENCOUNTER — Encounter: Payer: Self-pay | Admitting: Emergency Medicine

## 2018-11-02 ENCOUNTER — Other Ambulatory Visit: Payer: Self-pay

## 2018-11-02 VITALS — BP 121/81 | HR 75 | Temp 98.2°F | Resp 16 | Ht 69.0 in | Wt 222.2 lb

## 2018-11-02 DIAGNOSIS — M25561 Pain in right knee: Secondary | ICD-10-CM | POA: Diagnosis not present

## 2018-11-02 DIAGNOSIS — Z23 Encounter for immunization: Secondary | ICD-10-CM

## 2018-11-02 DIAGNOSIS — G8929 Other chronic pain: Secondary | ICD-10-CM | POA: Insufficient documentation

## 2018-11-02 DIAGNOSIS — M25562 Pain in left knee: Secondary | ICD-10-CM | POA: Diagnosis not present

## 2018-11-02 DIAGNOSIS — E1165 Type 2 diabetes mellitus with hyperglycemia: Secondary | ICD-10-CM | POA: Insufficient documentation

## 2018-11-02 MED ORDER — INSULIN DETEMIR 100 UNIT/ML ~~LOC~~ SOLN: 65.0000 [IU] | Freq: Every day | SUBCUTANEOUS | 7 refills | Status: DC

## 2018-11-02 MED ORDER — LIRAGLUTIDE 18 MG/3ML ~~LOC~~ SOPN: 0.6000 mg | PEN_INJECTOR | Freq: Every day | SUBCUTANEOUS | 11 refills | Status: DC

## 2018-11-02 MED ORDER — METFORMIN HCL 500 MG PO TABS: 500.0000 mg | ORAL_TABLET | Freq: Two times a day (BID) | ORAL | 3 refills | Status: DC

## 2018-11-02 MED ORDER — ROSUVASTATIN CALCIUM 10 MG PO TABS: 10.0000 mg | ORAL_TABLET | Freq: Every day | ORAL | 3 refills | Status: DC

## 2018-11-02 NOTE — Patient Instructions (Addendum)
   If you have lab work done today you will be contacted with your lab results within the next 2 weeks.  If you have not heard from us then please contact us. The fastest way to get your results is to register for My Chart.   IF you received an x-ray today, you will receive an invoice from [facility]. Please contact [facility] at [phone] with questions or concerns regarding your invoice.   IF you received labwork today, you will receive an invoice from [facility]. Please contact [facility] at [phone] with questions or concerns regarding your invoice.   Our billing staff will not be able to assist you with questions regarding bills from these companies.  You will be contacted with the lab results as soon as they are available. The fastest way to get your results is to activate your My Chart account. Instructions are located on the last page of this paperwork. If you have not heard from us regarding the results in 2 weeks, please contact this office.     Diabetes Mellitus and Nutrition, Adult When you have diabetes (diabetes mellitus), it is very important to have healthy eating habits because your blood sugar (glucose) levels are greatly affected by what you eat and drink. Eating healthy foods in the appropriate amounts, at about the same times every day, can help you:  Control your blood glucose.  Lower your risk of heart disease.  Improve your blood pressure.  Reach or maintain a healthy weight. Every person with diabetes is different, and each person has different needs for a meal plan. Your health care provider may recommend that you work with a diet and nutrition specialist (dietitian) to make a meal plan that is best for you. Your meal plan may vary depending on factors such as:  The calories you need.  The medicines you take.  Your weight.  Your blood glucose, blood pressure, and cholesterol levels.  Your activity level.  Other health conditions  you have, such as heart or kidney disease. How do carbohydrates affect me? Carbohydrates, also called carbs, affect your blood glucose level more than any other type of food. Eating carbs naturally raises the amount of glucose in your blood. Carb counting is a method for keeping track of how many carbs you eat. Counting carbs is important to keep your blood glucose at a healthy level, especially if you use insulin or take certain oral diabetes medicines. It is important to know how many carbs you can safely have in each meal. This is different for every person. Your dietitian can help you calculate how many carbs you should have at each meal and for each snack. Foods that contain carbs include:  Bread, cereal, rice, pasta, and crackers.  Potatoes and corn.  Peas, beans, and lentils.  Milk and yogurt.  Fruit and juice.  Desserts, such as cakes, cookies, ice cream, and candy. How does alcohol affect me? Alcohol can cause a sudden decrease in blood glucose (hypoglycemia), especially if you use insulin or take certain oral diabetes medicines. Hypoglycemia can be a life-threatening condition. Symptoms of hypoglycemia (sleepiness, dizziness, and confusion) are similar to symptoms of having too much alcohol. If your health care provider says that alcohol is safe for you, follow these guidelines:  Limit alcohol intake to no more than 1 drink per day for nonpregnant women and 2 drinks per day for men. One drink equals 12 oz of beer, 5 oz of wine, or 1 oz of hard liquor.    Do not drink on an empty stomach.  Keep yourself hydrated with water, diet soda, or unsweetened iced tea.  Keep in mind that regular soda, juice, and other mixers may contain a lot of sugar and must be counted as carbs. What are tips for following this plan?  Reading food labels  Start by checking the serving size on the "Nutrition Facts" label of packaged foods and drinks. The amount of calories, carbs, fats, and other  nutrients listed on the label is based on one serving of the item. Many items contain more than one serving per package.  Check the total grams (g) of carbs in one serving. You can calculate the number of servings of carbs in one serving by dividing the total carbs by 15. For example, if a food has 30 g of total carbs, it would be equal to 2 servings of carbs.  Check the number of grams (g) of saturated and trans fats in one serving. Choose foods that have low or no amount of these fats.  Check the number of milligrams (mg) of salt (sodium) in one serving. Most people should limit total sodium intake to less than 2,300 mg per day.  Always check the nutrition information of foods labeled as "low-fat" or "nonfat". These foods may be higher in added sugar or refined carbs and should be avoided.  Talk to your dietitian to identify your daily goals for nutrients listed on the label. Shopping  Avoid buying canned, premade, or processed foods. These foods tend to be high in fat, sodium, and added sugar.  Shop around the outside edge of the grocery store. This includes fresh fruits and vegetables, bulk grains, fresh meats, and fresh dairy. Cooking  Use low-heat cooking methods, such as baking, instead of high-heat cooking methods like deep frying.  Cook using healthy oils, such as olive, canola, or sunflower oil.  Avoid cooking with butter, cream, or high-fat meats. Meal planning  Eat meals and snacks regularly, preferably at the same times every day. Avoid going long periods of time without eating.  Eat foods high in fiber, such as fresh fruits, vegetables, beans, and whole grains. Talk to your dietitian about how many servings of carbs you can eat at each meal.  Eat 4-6 ounces (oz) of lean protein each day, such as lean meat, chicken, fish, eggs, or tofu. One oz of lean protein is equal to: ? 1 oz of meat, chicken, or fish. ? 1 egg. ?  cup of tofu.  Eat some foods each day that contain  healthy fats, such as avocado, nuts, seeds, and fish. Lifestyle  Check your blood glucose regularly.  Exercise regularly as told by your health care provider. This may include: ? 150 minutes of moderate-intensity or vigorous-intensity exercise each week. This could be brisk walking, biking, or water aerobics. ? Stretching and doing strength exercises, such as yoga or weightlifting, at least 2 times a week.  Take medicines as told by your health care provider.  Do not use any products that contain nicotine or tobacco, such as cigarettes and e-cigarettes. If you need help quitting, ask your health care provider.  Work with a counselor or diabetes educator to identify strategies to manage stress and any emotional and social challenges. Questions to ask a health care provider  Do I need to meet with a diabetes educator?  Do I need to meet with a dietitian?  What number can I call if I have questions?  When are the best times to   check my blood glucose? Where to find more information:  American Diabetes Association: diabetes.org  Academy of Nutrition and Dietetics: www.eatright.org  National Institute of Diabetes and Digestive and Kidney Diseases (NIH): www.niddk.nih.gov Summary  A healthy meal plan will help you control your blood glucose and maintain a healthy lifestyle.  Working with a diet and nutrition specialist (dietitian) can help you make a meal plan that is best for you.  Keep in mind that carbohydrates (carbs) and alcohol have immediate effects on your blood glucose levels. It is important to count carbs and to use alcohol carefully. This information is not intended to replace advice given to you by your health care provider. Make sure you discuss any questions you have with your health care provider. Document Released: 12/05/2004 Document Revised: 02/20/2017 Document Reviewed: 04/14/2016 Elsevier Patient Education  2020 Elsevier Inc.  

## 2018-11-02 NOTE — Progress Notes (Signed)
Aaron Woods 33 y.o.   Chief Complaint  Patient presents with  . Establish Care    LEFT knee pain x 2 weeks, per patient 1 year  . Diabetes    medication refill - Metformin and Levemir    HISTORY OF PRESENT ILLNESS: This is a 33 y.o. male with history of diabetes here to establish care.  First visit to this office.  Diagnosed with diabetes in 2006.  Has been off medication for the past week.  Morning sugars at home in the 200s, evening sugars 180s.  High-dose metformin gives him lots of GI side effects.  Mother has diabetes takes Victoza.  Inquiring about this medication.  Fasting labs today. Has also had left knee pain for the past 2 years, on and off, denies direct injury.  HPI   Prior to Admission medications   Medication Sig Start Date End Date Taking? Authorizing Provider  insulin detemir (LEVEMIR) 100 UNIT/ML injection Inject into the skin daily.   Yes [provider]  fluticasone (FLONASE) 50 MCG/ACT nasal spray Place 2 sprays into both nostrils daily. 05/03/16 05/03/17  Enid Derry, PA-C  metFORMIN (GLUCOPHAGE) 1000 MG tablet Take 1,000 mg by mouth 2 (two) times daily with a meal.      [provider]    No Known Allergies  There are no active problems to display for this patient.   Past Medical History:  Diagnosis Date  . Diabetes mellitus     History reviewed. No pertinent surgical history.  Social History   Socioeconomic History  . Marital status: Married    Spouse name: Not on file  . Number of children: Not on file  . Years of education: Not on file  . Highest education level: Not on file  Occupational History  . Not on file  Social Needs  . Financial resource strain: Not on file  . Food insecurity    Worry: Not on file    Inability: Not on file  . Transportation needs    Medical: Not on file    Non-medical: Not on file  Tobacco Use  . Smoking status: Never Smoker  . Smokeless tobacco: Never Used  Substance and Sexual  Activity  . Alcohol use: No  . Drug use: No  . Sexual activity: Not on file  Lifestyle  . Physical activity    Days per week: Not on file    Minutes per session: Not on file  . Stress: Not on file  Relationships  . Social Musician on phone: Not on file    Gets together: Not on file    Attends religious service: Not on file    Active member of club or organization: Not on file    Attends meetings of clubs or organizations: Not on file    Relationship status: Not on file  . Intimate partner violence    Fear of current or ex partner: Not on file    Emotionally abused: Not on file    Physically abused: Not on file    Forced sexual activity: Not on file  Other Topics Concern  . Not on file  Social History Narrative  . Not on file    History reviewed. No pertinent family history.   Review of Systems  Constitutional: Negative.  Negative for chills and fever.  HENT: Negative.  Negative for congestion and sore throat.   Eyes: Negative.   Respiratory: Negative.  Negative for cough and shortness of breath.  Cardiovascular: Negative.  Negative for chest pain and palpitations.  Gastrointestinal: Negative.  Negative for abdominal pain, nausea and vomiting.  Genitourinary: Negative.  Negative for dysuria and hematuria.  Musculoskeletal: Positive for joint pain (Knee pain, left more than right). Negative for myalgias and neck pain.  Skin: Negative.   Neurological: Negative for dizziness and headaches.  Endo/Heme/Allergies: Negative.   All other systems reviewed and are negative.  Vitals:   11/02/18 1543  BP: 121/81  Pulse: 75  Resp: 16  Temp: 98.2 F (36.8 C)  SpO2: 96%     Physical Exam Vitals signs reviewed.  Constitutional:      Appearance: Normal appearance.  HENT:     Head: Normocephalic.  Eyes:     Extraocular Movements: Extraocular movements intact.     Conjunctiva/sclera: Conjunctivae normal.     Pupils: Pupils are equal, round, and reactive to  light.  Neck:     Musculoskeletal: Normal range of motion and neck supple.  Cardiovascular:     Rate and Rhythm: Normal rate and regular rhythm.     Pulses: Normal pulses.     Heart sounds: Normal heart sounds.  Pulmonary:     Effort: Pulmonary effort is normal.     Breath sounds: Normal breath sounds.  Abdominal:     Tenderness: There is no abdominal tenderness.  Musculoskeletal:     Comments: Knees: No erythema or swelling.  No tenderness.  Full range of motion.  Stable in flexion and extension.  Skin:    General: Skin is warm and dry.     Capillary Refill: Capillary refill takes less than 2 seconds.  Neurological:     General: No focal deficit present.     Mental Status: He is alert and oriented to person, place, and time.  Psychiatric:        Mood and Affect: Mood normal.        Behavior: Behavior normal.      ASSESSMENT & PLAN: Uncontrolled type 2 diabetes mellitus with hyperglycemia (HCC) Uncontrolled diabetes.  Continue metformin but decrease dose to 500 mg twice a day.  Continue Levemir insulin 65 units in the evening.  Will start Victoza.  Diet and nutrition along with exercise encouraged.  Statin therapy started today with Crestor 10 mg daily.  Pneumonia vaccine given.  Follow-up in 4 weeks.  [name] was seen today for establish care and diabetes.  Diagnoses and all orders for this visit:  Uncontrolled type 2 diabetes mellitus with hyperglycemia (HCC) -     CBC with Differential/Platelet -     Comprehensive metabolic panel -     Hemoglobin A1c -     Lipid panel -     insulin detemir (LEVEMIR) 100 UNIT/ML injection; Inject 0.65 mLs (65 Units total) into the skin daily. Use in the evening. -     metFORMIN (GLUCOPHAGE) 500 MG tablet; Take 1 tablet (500 mg total) by mouth 2 (two) times daily with a meal. -     liraglutide (VICTOZA) 18 MG/3ML SOPN; Inject 0.1 mLs (0.6 mg total) into the skin daily. Increase to 1.2mg  daily after first week. -     rosuvastatin (CRESTOR)  10 MG tablet; Take 1 tablet (10 mg total) by mouth daily. -     Pneumococcal polysaccharide vaccine 23-valent greater than or equal to 2yo subcutaneous/IM  Chronic pain of both knees -     Ambulatory referral to Orthopedic Surgery    Patient Instructions       If you have  lab work done today you will be contacted with your lab results within the next 2 weeks.  If you have not heard from us then please contact us. The fastest way to get your results is to register for My Chart.   IF you received an x-ray today, you will [facility]. Please contact [facility] at [phone] with questions or concerns regarding your invoice.   IF you received labwork today, you will receive an invoice from [facility]. Please contact [facility] at [phone] with questions or concerns regarding your invoice.   Our billing staff will not be able to assist you with questions regarding bills from these companies.  You will be contacted with the lab results as soon as they are available. The fastest way to get your results is to activate your My Chart account. Instructions are located on the last page of this paperwork. If you have not heard from us regarding the results in 2 weeks, please contact this office.     Diabetes Mellitus and Nutrition, Adult When you have diabetes (diabetes mellitus), it is very important to have healthy eating habits because your blood sugar (glucose) levels are greatly affected by what you eat and drink. Eating healthy foods in the appropriate amounts, at about the same times every day, can help you:  Control your blood glucose.  Lower your risk of heart disease.  Improve your blood pressure.  Reach or maintain a healthy weight. Every person with diabetes is different, and each person has different needs for a meal plan. Your health care provider may recommend that you work with a diet and nutrition specialist (dietitian) to make a  meal plan that is best for you. Your meal plan may vary depending on factors such as:  The calories you need.  The medicines you take.  Your weight.  Your blood glucose, blood pressure, and cholesterol levels.  Your activity level.  Other health conditions you have, such as heart or kidney disease. How do carbohydrates affect me? Carbohydrates, also called carbs, affect your blood glucose level more than any other type of food. Eating carbs naturally raises the amount of glucose in your blood. Carb counting is a method for keeping track of how many carbs you eat. Counting carbs is important to keep your blood glucose at a healthy level, especially if you use insulin or take certain oral diabetes medicines. It is important to know how many carbs you can safely have in each meal. This is different for every person. Your dietitian can help you calculate how many carbs you should have at each meal and for each snack. Foods that contain carbs include:  Bread, cereal, rice, pasta, and crackers.  Potatoes and corn.  Peas, beans, and lentils.  Milk and yogurt.  Fruit and juice.  Desserts, such as cakes, cookies, ice cream, and candy. How does alcohol affect me? Alcohol can cause a sudden decrease in blood glucose (hypoglycemia), especially if you use insulin or take certain oral diabetes medicines. Hypoglycemia can be a life-threatening condition. Symptoms of hypoglycemia (sleepiness, dizziness, and confusion) are similar to symptoms of having too much alcohol. If your health care provider says that alcohol is safe for you, follow these guidelines:  Limit alcohol intake to no more than 1 drink per day for nonpregnant women and 2 drinks per day for men. One drink equals 12 oz of beer, 5 oz of wine, or 1 oz of hard liquor.  Do not drink on an  empty stomach.  Keep yourself hydrated with water, diet soda, or unsweetened iced tea.  Keep in mind that regular soda, juice, and other mixers  may contain a lot of sugar and must be counted as carbs. What are tips for following this plan?  Reading food labels  Start by checking the serving size on the "Nutrition Facts" label of packaged foods and drinks. The amount of calories, carbs, fats, and other nutrients listed on the label is based on one serving of the item. Many items contain more than one serving per package.  Check the total grams (g) of carbs in one serving. You can calculate the number of servings of carbs in one serving by dividing the total carbs by 15. For example, if a food has 30 g of total carbs, it would be equal to 2 servings of carbs.  Check the number of grams (g) of saturated and trans fats in one serving. Choose foods that have low or no amount of these fats.  Check the number of milligrams (mg) of salt (sodium) in one serving. Most people should limit total sodium intake to less than 2,300 mg per day.  Always check the nutrition information of foods labeled as "low-fat" or "nonfat". These foods may be higher in added sugar or refined carbs and should be avoided.  Talk to your dietitian to identify your daily goals for nutrients listed on the label. Shopping  Avoid buying canned, premade, or processed foods. These foods tend to be high in fat, sodium, and added sugar.  Shop around the outside edge of the grocery store. This includes fresh fruits and vegetables, bulk grains, fresh meats, and fresh dairy. Cooking  Use low-heat cooking methods, such as baking, instead of high-heat cooking methods like deep frying.  Cook using healthy oils, such as olive, canola, or sunflower oil.  Avoid cooking with butter, cream, or high-fat meats. Meal planning  Eat meals and snacks regularly, preferably at the same times every day. Avoid going long periods of time without eating.  Eat foods high in fiber, such as fresh fruits, vegetables, beans, and whole grains. Talk to your dietitian about how many servings of  carbs you can eat at each meal.  Eat 4-6 ounces (oz) of lean protein each day, such as lean meat, chicken, fish, eggs, or tofu. One oz of lean protein is equal to: ? 1 oz of meat, chicken, or fish. ? 1 egg. ?  cup of tofu.  Eat some foods each day that contain healthy fats, such as avocado, nuts, seeds, and fish. Lifestyle  Check your blood glucose regularly.  Exercise regularly as told by your health care provider. This may include: ? 150 minutes of moderate-intensity or vigorous-intensity exercise each week. This could be brisk walking, biking, or water aerobics. ? Stretching and doing strength exercises, such as yoga or weightlifting, at least 2 times a week.  Take medicines as told by your health care provider.  Do not use any products that contain nicotine or tobacco, such as cigarettes and e-cigarettes. If you need help quitting, ask your health care provider.  Work with a Veterinary surgeoncounselor or diabetes educator to identify strategies to manage stress and any emotional and social challenges. Questions to ask a health care provider  Do I need to meet with a diabetes educator?  Do I need to meet with a dietitian?  What number can I call if I have questions?  When are the best times to check my blood glucose? Where  to find more information:  American Diabetes Association: diabetes.org  Academy of Nutrition and Dietetics: www.eatright.[employer]  National Institute of Diabetes and Digestive and Kidney Diseases (NIH): Car[employer].tnwww.niddk.nih.gov Summary  A healthy meal plan will help you control your blood glucose and maintain a healthy lifestyle.  Working with a diet and nutrition specialist (dietitian) can help you make a meal plan that is best for you.  Keep in mind that carbohydrates (carbs) and alcohol have immediate effects on your blood glucose levels. It is important to count carbs and to use alcohol carefully. This information is not intended to replace advice given to you by your health  care provider. Make sure you discuss any questions you have with your health care provider. Document Released: 12/05/2004 Document Revised: 02/20/2017 Document Reviewed: 04/14/2016 Elsevier Patient Education  2020 Elsevier Inc.      Edwina BarthMiguel Eian Vandervelden, MD [facility]

## 2018-11-02 NOTE — Assessment & Plan Note (Signed)
Uncontrolled diabetes.  Continue metformin but decrease dose to 500 mg twice a day.  Continue Levemir insulin 65 units in the evening.  Will start Victoza.  Diet and nutrition along with exercise encouraged.  Statin therapy started today with Crestor 10 mg daily.  Pneumonia vaccine given.  Follow-up in 4 weeks.

## 2018-11-03 LAB — CBC WITH DIFFERENTIAL/PLATELET
Basophils Absolute: 0 10*3/uL (ref 0.0–0.2)
Basos: 1 %
EOS (ABSOLUTE): 0.1 10*3/uL (ref 0.0–0.4)
Eos: 1 %
Hematocrit: 40.8 % (ref 37.5–51.0)
Hemoglobin: 12.9 g/dL — ABNORMAL LOW (ref 13.0–17.7)
Immature Grans (Abs): 0 10*3/uL (ref 0.0–0.1)
Immature Granulocytes: 0 %
Lymphocytes Absolute: 2.9 10*3/uL (ref 0.7–3.1)
Lymphs: 45 %
MCH: 24.2 pg — ABNORMAL LOW (ref 26.6–33.0)
MCHC: 31.6 g/dL (ref 31.5–35.7)
MCV: 77 fL — ABNORMAL LOW (ref 79–97)
Monocytes Absolute: 0.6 10*3/uL (ref 0.1–0.9)
Monocytes: 9 %
Neutrophils Absolute: 2.9 10*3/uL (ref 1.4–7.0)
Neutrophils: 44 %
Platelets: 336 10*3/uL (ref 150–450)
RBC: 5.33 x10E6/uL (ref 4.14–5.80)
RDW: 13 % (ref 11.6–15.4)
WBC: 6.6 10*3/uL (ref 3.4–10.8)

## 2018-11-03 LAB — COMPREHENSIVE METABOLIC PANEL
ALT: 31 IU/L (ref 0–44)
AST: 18 IU/L (ref 0–40)
Albumin/Globulin Ratio: 1.7 (ref 1.2–2.2)
Albumin: 4.2 g/dL (ref 4.0–5.0)
Alkaline Phosphatase: 87 IU/L (ref 39–117)
BUN/Creatinine Ratio: 15 (ref 9–20)
BUN: 11 mg/dL (ref 6–20)
Bilirubin Total: 0.4 mg/dL (ref 0.0–1.2)
CO2: 25 mmol/L (ref 20–29)
Calcium: 9.6 mg/dL (ref 8.7–10.2)
Chloride: 100 mmol/L (ref 96–106)
Creatinine, Ser: 0.71 mg/dL — ABNORMAL LOW (ref 0.76–1.27)
GFR calc Af Amer: 141 mL/min/{1.73_m2} (ref >59)
GFR calc non Af Amer: 122 mL/min/{1.73_m2} (ref >59)
Globulin, Total: 2.5 g/dL (ref 1.5–4.5)
Glucose: 185 mg/dL — ABNORMAL HIGH (ref 65–99)
Potassium: 4 mmol/L (ref 3.5–5.2)
Sodium: 138 mmol/L (ref 134–144)
Total Protein: 6.7 g/dL (ref 6.0–8.5)

## 2018-11-03 LAB — HEMOGLOBIN A1C
Est. average glucose Bld gHb Est-mCnc: 338 mg/dL
Hgb A1c MFr Bld: 13.4 % — ABNORMAL HIGH (ref 4.8–5.6)

## 2018-11-03 LAB — LIPID PANEL
Chol/HDL Ratio: 5 ratio (ref 0.0–5.0)
Cholesterol, Total: 210 mg/dL — ABNORMAL HIGH (ref 100–199)
HDL: 42 mg/dL (ref >39)
LDL Calculated: 151 mg/dL — ABNORMAL HIGH (ref 0–99)
Triglycerides: 84 mg/dL (ref 0–149)
VLDL Cholesterol Cal: 17 mg/dL (ref 5–40)

## 2018-11-10 ENCOUNTER — Ambulatory Visit: Payer: Self-pay

## 2018-11-10 ENCOUNTER — Encounter: Payer: Self-pay | Admitting: Orthopedic Surgery

## 2018-11-10 ENCOUNTER — Other Ambulatory Visit: Payer: Self-pay

## 2018-11-10 ENCOUNTER — Ambulatory Visit (INDEPENDENT_AMBULATORY_CARE_PROVIDER_SITE_OTHER): Payer: 59 | Admitting: Orthopedic Surgery

## 2018-11-10 VITALS — Ht 69.0 in | Wt 220.0 lb

## 2018-11-10 DIAGNOSIS — G8929 Other chronic pain: Secondary | ICD-10-CM | POA: Diagnosis not present

## 2018-11-10 DIAGNOSIS — M25562 Pain in left knee: Secondary | ICD-10-CM | POA: Diagnosis not present

## 2018-11-12 ENCOUNTER — Encounter: Payer: Self-pay | Admitting: Orthopedic Surgery

## 2018-11-12 NOTE — Progress Notes (Signed)
Office Visit Note   Patient: Aaron Woods           Date of Birth: 16-Feb-1986           MRN: [id number] Visit Date: 11/10/2018 Requested by: Horald Pollen, MD [place],  [place] [id number] PCP: Horald Pollen, MD  Subjective: Chief Complaint  Patient presents with  . Right Knee - Pain  . Left Knee - Pain    HPI: Patient presents with bilateral knee pain left worse than right.  The left knee hurts him essentially all the time the right knee hurts him occasionally.  He played football in high school he does have a history of cortisone injections in both knees x1 at that time.  Did not have an injury where he had acute swelling or instability.  He does report pain in that left knee with prolonged walking going up and down steps.  States that his knee will give way sometimes.  He is tried Motrin with some relief as well as a exercise program of walking and stretching without relief.  Symptoms have been going on for a year worse over the last 3 months.  The pain localizes to the lateral aspect of that left knee.  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  Hard for him to do squats.              ROS: All systems reviewed are negative as they relate to the chief complaint within the history of present illness.  Patient denies  fevers or chills.   Assessment & Plan: Visit Diagnoses:  1. Chronic pain of left knee     Plan: Impression is bilateral knee pain left worse than right with possible lateral meniscal pathology present.  This does not look like iliotibial band bursitis.  That is a possibility however.  I think more likely is lateral compartment meniscal pathology versus occult chondral injury.  Although he has no effusion I think based on his symptoms for over a year worse in the last 3 months with failure of conservative management warrants MRI scanning.  We will get that scan and see if there is something that can be  done about it either with injection or arthroscopic debridement.  He is failed conservative measures up to this point.  Follow-Up Instructions: Return for after MRI.   Orders:  Orders Placed This Encounter  Procedures  . XR KNEE 3 VIEW LEFT  . MR Knee Left w/o contrast   No orders of the defined types were placed in this encounter.     Procedures: No procedures performed   Clinical Data: No additional findings.  Objective: Vital Signs: Ht 5\' 9"  (1.753 m)   Wt 220 lb (99.8 kg)   BMI 32.49 kg/m   Physical Exam:   Constitutional: Patient appears well-developed HEENT:  Head: Normocephalic Eyes:EOM are normal Neck: Normal range of motion Cardiovascular: Normal rate Pulmonary/chest: Effort normal Neurologic: Patient is alert Skin: Skin is warm Psychiatric: Patient has normal mood and affect    Ortho Exam: Ortho exam demonstrates full active and passive range of motion of both knees.  Has lateral joint line tenderness on the left but not on the right.  Collateral and cruciate ligaments are stable.  No malalignment or mall patellar tracking.  Pedal pulses palpable.  No other masses lymphadenopathy or skin changes noted in that left knee region.  No groin pain with internal X  rotation of the leg.  Specialty Comments:  No specialty comments available.  Imaging: No results found.   PMFS History: Patient Active Problem List   Diagnosis Date Noted  . Uncontrolled type 2 diabetes mellitus with hyperglycemia (HCC) 11/02/2018  . Chronic pain of both knees 11/02/2018   Past Medical History:  Diagnosis Date  . Diabetes mellitus     History reviewed. No pertinent family history.  History reviewed. No pertinent surgical history. Social History   Occupational History  . Not on file  Tobacco Use  . Smoking status: Never Smoker  . Smokeless tobacco: Never Used  Substance and Sexual Activity  . Alcohol use: No  . Drug use: No  . Sexual activity: Not on file

## 2018-11-30 ENCOUNTER — Encounter: Payer: Self-pay | Admitting: Emergency Medicine

## 2018-11-30 ENCOUNTER — Ambulatory Visit
Admission: RE | Admit: 2018-11-30 | Discharge: 2018-11-30 | Disposition: A | Discharge status: Home / Self Care | Payer: 59 | Source: Ambulatory Visit | Attending: Orthopedic Surgery | Admitting: Orthopedic Surgery

## 2018-11-30 ENCOUNTER — Other Ambulatory Visit: Payer: Self-pay

## 2018-11-30 ENCOUNTER — Ambulatory Visit (INDEPENDENT_AMBULATORY_CARE_PROVIDER_SITE_OTHER): Payer: 59 | Admitting: Emergency Medicine

## 2018-11-30 VITALS — BP 118/79 | HR 90 | Temp 98.5°F | Resp 16 | Ht 69.0 in | Wt 221.4 lb

## 2018-11-30 DIAGNOSIS — E1165 Type 2 diabetes mellitus with hyperglycemia: Secondary | ICD-10-CM | POA: Diagnosis not present

## 2018-11-30 DIAGNOSIS — E785 Hyperlipidemia, unspecified: Secondary | ICD-10-CM | POA: Diagnosis not present

## 2018-11-30 DIAGNOSIS — E1169 Type 2 diabetes mellitus with other specified complication: Secondary | ICD-10-CM | POA: Diagnosis not present

## 2018-11-30 DIAGNOSIS — M25562 Pain in left knee: Secondary | ICD-10-CM

## 2018-11-30 DIAGNOSIS — G8929 Other chronic pain: Secondary | ICD-10-CM

## 2018-11-30 LAB — POCT GLYCOSYLATED HEMOGLOBIN (HGB A1C): Hemoglobin A1C: 13.4 % — AB (ref 4.0–5.6)

## 2018-11-30 LAB — GLUCOSE, POCT (MANUAL RESULT ENTRY): POC Glucose: 158 mg/dl — AB (ref 70–99)

## 2018-11-30 IMAGING — MR MR KNEE*L* W/O CM
5 of 7 series · 25 of 40 positions shown · non-contrast
Comparison: None.

CLINICAL DATA: Left knee pain for over a year

EXAM:
MRI OF THE LEFT KNEE WITHOUT CONTRAST
TECHNIQUE: Multiplanar, multisequence MR imaging of the knee was performed. No
intravenous contrast was administered.

[Series 3: T2 fat-sat · axial · 4.0mm · 0.50mm/px · z∈[-67,+48]mm · 6 of 24 slices shown (1 of 2)]
[im 1/24]
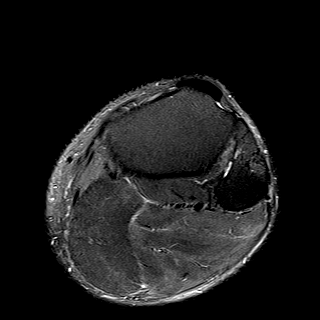
[im 5/24]
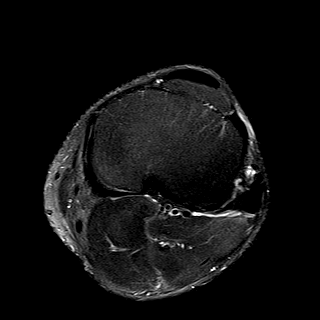
[im 10/24]
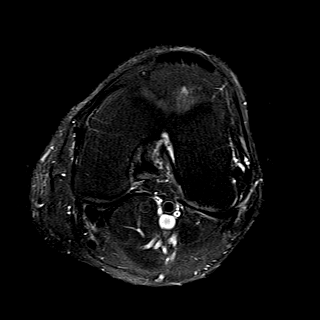
[im 14/24]
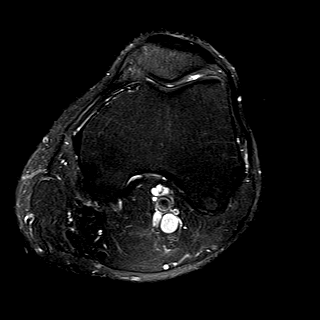
[im 19/24]
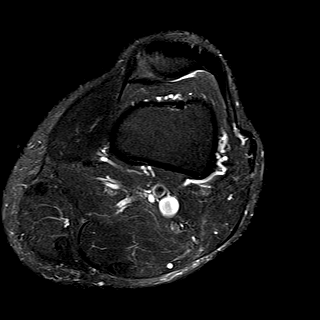
[im 24/24]
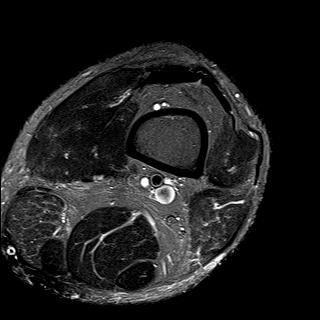

[Series 5: T2 fat-sat · coronal · 4.0mm · 0.29mm/px · 2 of 22 slices shown (2 of 2)]
[im 1/22]
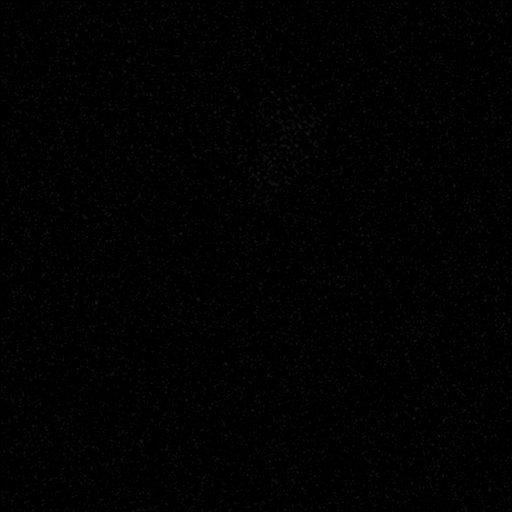
[im 5/22]
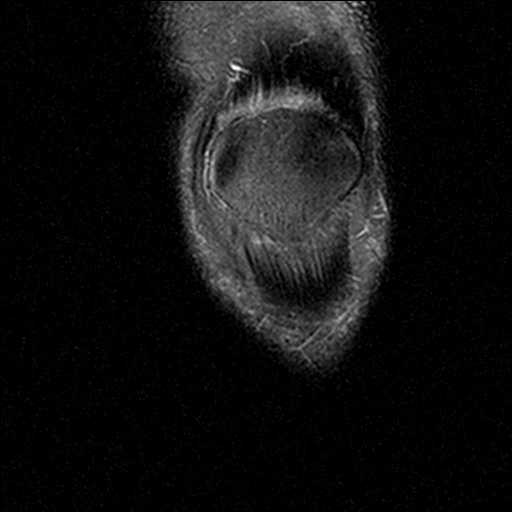

[Series 7: PD fat-sat · sagittal · 3.0mm · 0.29mm/px · 8 of 27 slices shown (1 of 3)]
[im 1/27]
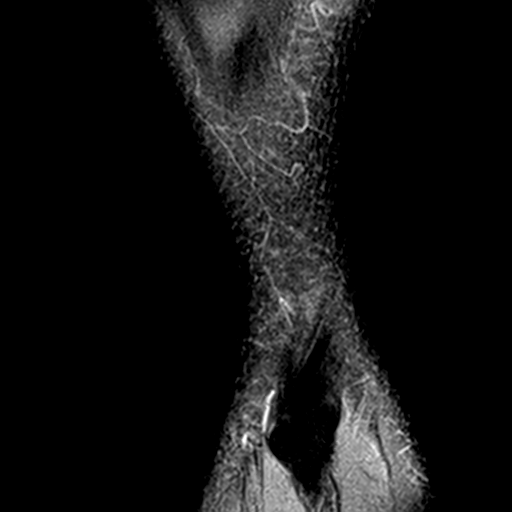
[im 4/27]
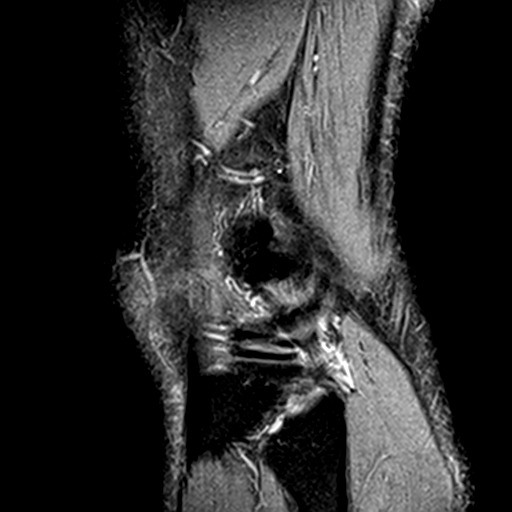
[im 8/27]
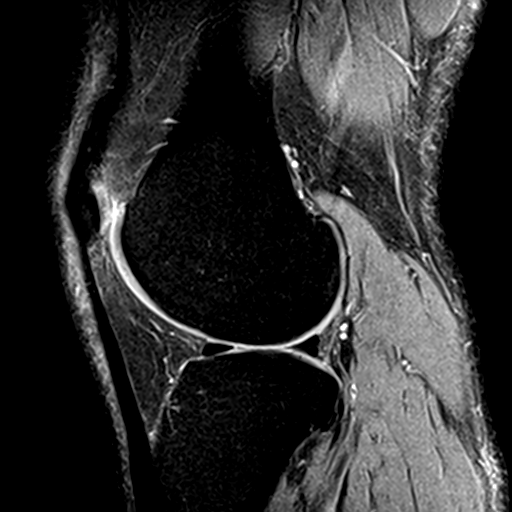
[im 12/27]
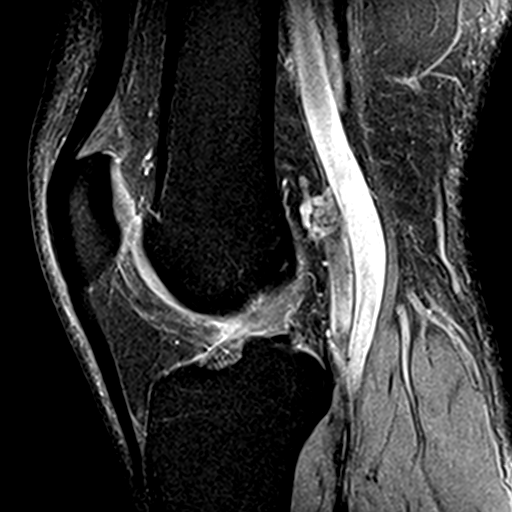
[im 15/27]
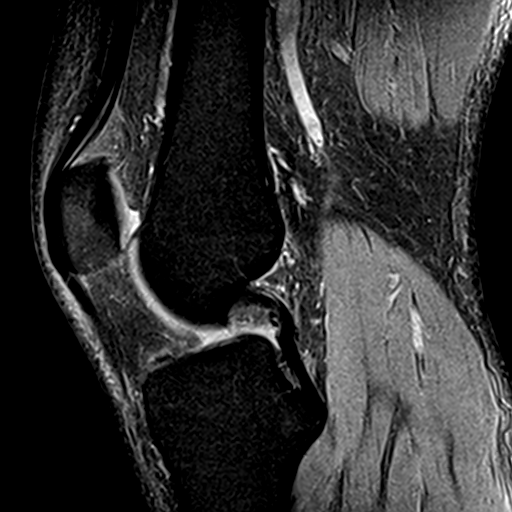
[im 19/27]
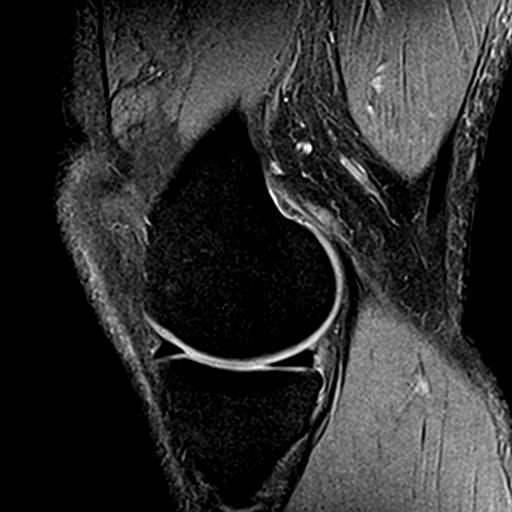
[im 23/27]
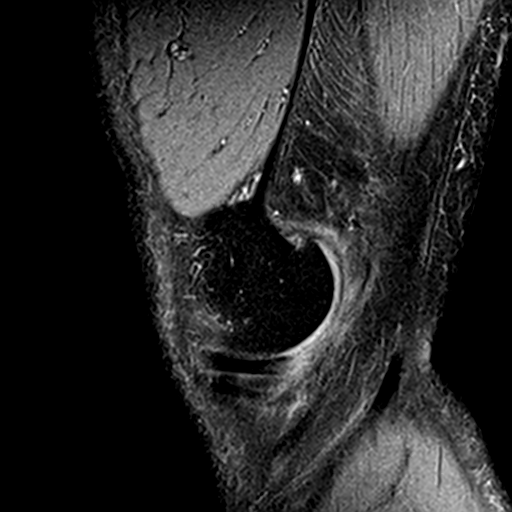
[im 27/27]
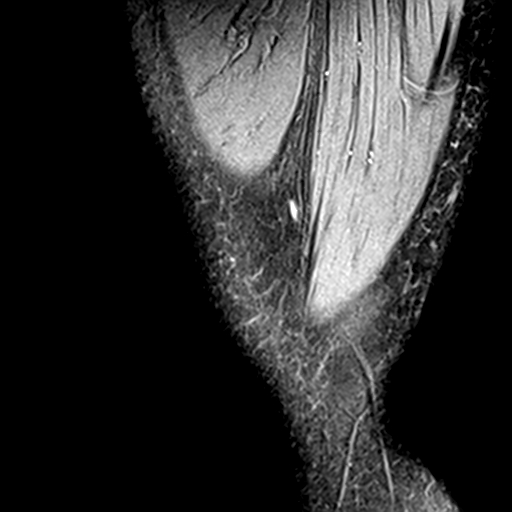

[Series 8: PD fat-sat · coronal · 4.0mm · 0.39mm/px · 6 of 22 slices shown (2 of 3)]
[im 1/22]
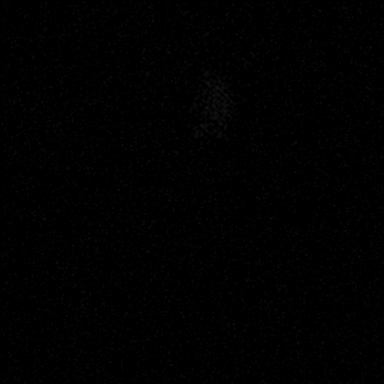
[im 5/22]
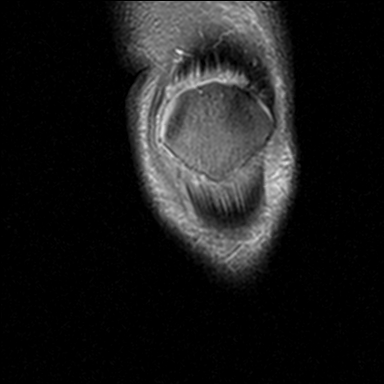
[im 9/22]
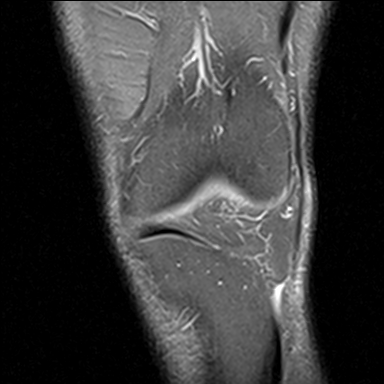
[im 13/22]
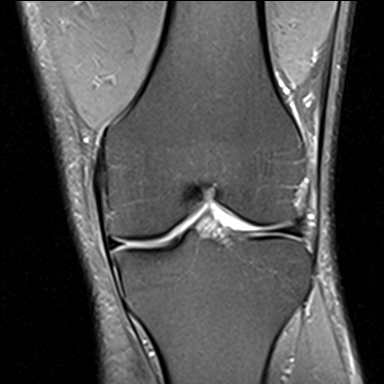
[im 17/22]
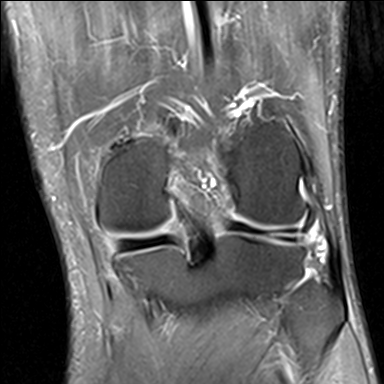
[im 22/22]
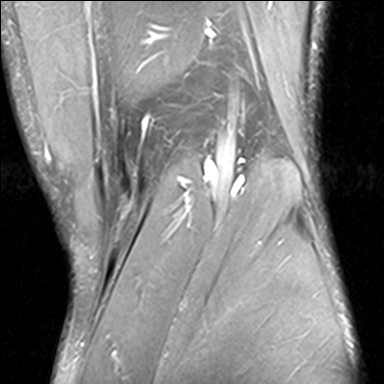

[Series 9: PD fat-sat · oblique · 2.0mm · 0.29mm/px · 3 of 11 slices shown (3 of 3)]
[im 1/11]
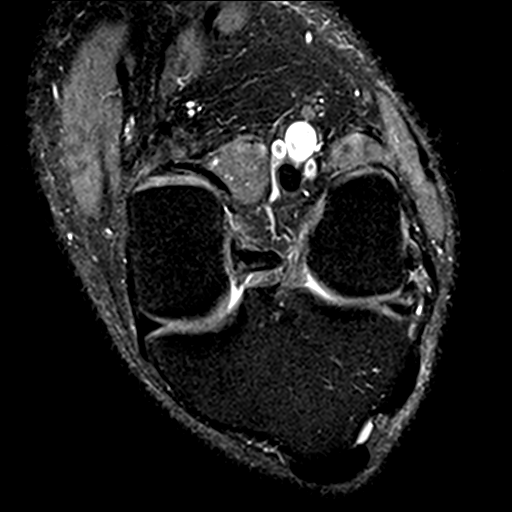
[im 6/11]
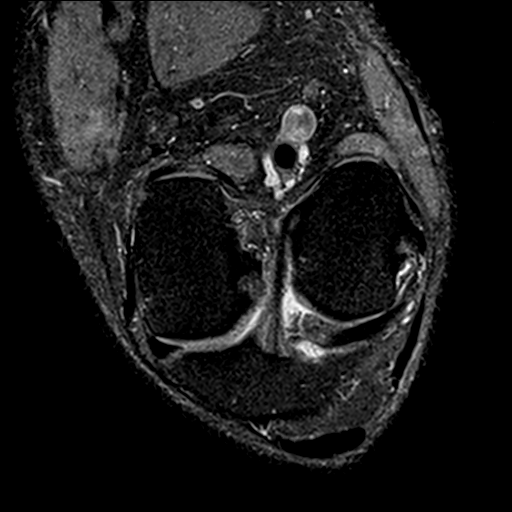
[im 11/11]
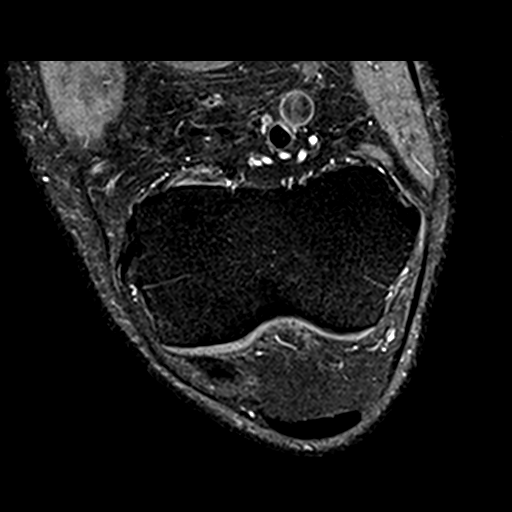

[25 of 40 positions shown; findings below may reference images not displayed]

FINDINGS: MENISCI

Medial meniscus:  Intact.

Lateral meniscus: Horizontal tear of the posterior horn-body
junction of lateral meniscus extending to the free edge.

LIGAMENTS

Cruciates:  Intact ACL and PCL.

Collaterals: Medial collateral ligament is intact. Lateral
collateral ligament complex is intact.

CARTILAGE

Patellofemoral:  No chondral defect.

Medial:  No chondral defect.

Lateral:  No chondral defect.

Joint: No joint effusion. Normal Hoffa's fat. No plical thickening.

Popliteal Fossa:  No Baker's cyst.  Intact popliteus tendon.

Extensor Mechanism: Intact quadriceps tendon. Intact patellar
tendon. Intact medial patellar retinaculum. Intact lateral patellar
retinaculum. Intact MPFL.

Bones:  No acute osseous abnormality.  No aggressive osseous lesion.

Other: No fluid collection or hematoma.  Muscles are normal.
IMPRESSION: 1. Horizontal tear of the posterior horn-body junction of lateral
meniscus extending to the free edge.

## 2018-11-30 NOTE — Patient Instructions (Addendum)
   If you have lab work done today you will be contacted with your lab results within the next 2 weeks.  If you have not heard from us then please contact us. The fastest way to get your results is to register for My Chart.   IF you received an x-ray today, you will receive an invoice from [facility]. Please contact  Radiology at [phone] with questions or concerns regarding your invoice.   IF you received labwork today, you will receive an invoice from [facility]. Please contact [facility] at [phone] with questions or concerns regarding your invoice.   Our billing staff will not be able to assist you with questions regarding bills from these companies.  You will be contacted with the lab results as soon as they are available. The fastest way to get your results is to activate your My Chart account. Instructions are located on the last page of this paperwork. If you have not heard from us regarding the results in 2 weeks, please contact this office.     Diabetes Mellitus and Nutrition, Adult When you have diabetes (diabetes mellitus), it is very important to have healthy eating habits because your blood sugar (glucose) levels are greatly affected by what you eat and drink. Eating healthy foods in the appropriate amounts, at about the same times every day, can help you:  Control your blood glucose.  Lower your risk of heart disease.  Improve your blood pressure.  Reach or maintain a healthy weight. Every person with diabetes is different, and each person has different needs for a meal plan. Your health care provider may recommend that you work with a diet and nutrition specialist (dietitian) to make a meal plan that is best for you. Your meal plan may vary depending on factors such as:  The calories you need.  The medicines you take.  Your weight.  Your blood glucose, blood pressure, and cholesterol levels.  Your activity level.  Other health conditions  you have, such as heart or kidney disease. How do carbohydrates affect me? Carbohydrates, also called carbs, affect your blood glucose level more than any other type of food. Eating carbs naturally raises the amount of glucose in your blood. Carb counting is a method for keeping track of how many carbs you eat. Counting carbs is important to keep your blood glucose at a healthy level, especially if you use insulin or take certain oral diabetes medicines. It is important to know how many carbs you can safely have in each meal. This is different for every person. Your dietitian can help you calculate how many carbs you should have at each meal and for each snack. Foods that contain carbs include:  Bread, cereal, rice, pasta, and crackers.  Potatoes and corn.  Peas, beans, and lentils.  Milk and yogurt.  Fruit and juice.  Desserts, such as cakes, cookies, ice cream, and candy. How does alcohol affect me? Alcohol can cause a sudden decrease in blood glucose (hypoglycemia), especially if you use insulin or take certain oral diabetes medicines. Hypoglycemia can be a life-threatening condition. Symptoms of hypoglycemia (sleepiness, dizziness, and confusion) are similar to symptoms of having too much alcohol. If your health care provider says that alcohol is safe for you, follow these guidelines:  Limit alcohol intake to no more than 1 drink per day for nonpregnant women and 2 drinks per day for men. One drink equals 12 oz of beer, 5 oz of wine, or 1 oz of hard liquor.    Do not drink on an empty stomach.  Keep yourself hydrated with water, diet soda, or unsweetened iced tea.  Keep in mind that regular soda, juice, and other mixers may contain a lot of sugar and must be counted as carbs. What are tips for following this plan?  Reading food labels  Start by checking the serving size on the "Nutrition Facts" label of packaged foods and drinks. The amount of calories, carbs, fats, and other  nutrients listed on the label is based on one serving of the item. Many items contain more than one serving per package.  Check the total grams (g) of carbs in one serving. You can calculate the number of servings of carbs in one serving by dividing the total carbs by 15. For example, if a food has 30 g of total carbs, it would be equal to 2 servings of carbs.  Check the number of grams (g) of saturated and trans fats in one serving. Choose foods that have low or no amount of these fats.  Check the number of milligrams (mg) of salt (sodium) in one serving. Most people should limit total sodium intake to less than 2,300 mg per day.  Always check the nutrition information of foods labeled as "low-fat" or "nonfat". These foods may be higher in added sugar or refined carbs and should be avoided.  Talk to your dietitian to identify your daily goals for nutrients listed on the label. Shopping  Avoid buying canned, premade, or processed foods. These foods tend to be high in fat, sodium, and added sugar.  Shop around the outside edge of the grocery store. This includes fresh fruits and vegetables, bulk grains, fresh meats, and fresh dairy. Cooking  Use low-heat cooking methods, such as baking, instead of high-heat cooking methods like deep frying.  Cook using healthy oils, such as olive, canola, or sunflower oil.  Avoid cooking with butter, cream, or high-fat meats. Meal planning  Eat meals and snacks regularly, preferably at the same times every day. Avoid going long periods of time without eating.  Eat foods high in fiber, such as fresh fruits, vegetables, beans, and whole grains. Talk to your dietitian about how many servings of carbs you can eat at each meal.  Eat 4-6 ounces (oz) of lean protein each day, such as lean meat, chicken, fish, eggs, or tofu. One oz of lean protein is equal to: ? 1 oz of meat, chicken, or fish. ? 1 egg. ?  cup of tofu.  Eat some foods each day that contain  healthy fats, such as avocado, nuts, seeds, and fish. Lifestyle  Check your blood glucose regularly.  Exercise regularly as told by your health care provider. This may include: ? 150 minutes of moderate-intensity or vigorous-intensity exercise each week. This could be brisk walking, biking, or water aerobics. ? Stretching and doing strength exercises, such as yoga or weightlifting, at least 2 times a week.  Take medicines as told by your health care provider.  Do not use any products that contain nicotine or tobacco, such as cigarettes and e-cigarettes. If you need help quitting, ask your health care provider.  Work with a counselor or diabetes educator to identify strategies to manage stress and any emotional and social challenges. Questions to ask a health care provider  Do I need to meet with a diabetes educator?  Do I need to meet with a dietitian?  What number can I call if I have questions?  When are the best times to   check my blood glucose? Where to find more information:  American Diabetes Association: diabetes.org  Academy of Nutrition and Dietetics: www.eatright.org  National Institute of Diabetes and Digestive and Kidney Diseases (NIH): www.niddk.nih.gov Summary  A healthy meal plan will help you control your blood glucose and maintain a healthy lifestyle.  Working with a diet and nutrition specialist (dietitian) can help you make a meal plan that is best for you.  Keep in mind that carbohydrates (carbs) and alcohol have immediate effects on your blood glucose levels. It is important to count carbs and to use alcohol carefully. This information is not intended to replace advice given to you by your health care provider. Make sure you discuss any questions you have with your health care provider. Document Released: 12/05/2004 Document Revised: 02/20/2017 Document Reviewed: 04/14/2016 [employer]vier Patient Education  2020 [employer]  

## 2018-11-30 NOTE — Progress Notes (Signed)
Lab Results  Component Value Date   HGBA1C 13.4 (H) 11/02/2018   BP Readings from Last 3 Encounters:  11/30/18 118/79  11/02/18 121/81  05/03/16 127/83   Lab Results  Component Value Date   CHOL 210 (H) 11/02/2018   HDL 42 11/02/2018   LDLCALC 151 (H) 11/02/2018   TRIG 84 11/02/2018   CHOLHDL 5.0 11/02/2018   Aaron Woods 33 y.o.   Chief Complaint  Patient presents with  . Diabetes    follow up 4 weeks    HISTORY OF PRESENT ILLNESS: This is a 33 y.o. male with history of diabetes here for follow-up.  Seen by me on 11/02/2018 with uncontrolled diabetes and hemoglobin A1c at 13.4.  Started on Victoza.  Also taking metformin and Levemir insulin 65 units in the evening.  Blood sugars have been high at home in the 200s.  Asymptomatic.  Tolerating Victoza well, no side effects.  Has no complaints today.  HPI   Prior to Admission medications   Medication Sig Start Date End Date Taking? Authorizing Provider  insulin detemir (LEVEMIR) 100 UNIT/ML injection Inject 0.65 mLs (65 Units total) into the skin daily. Use in the evening. 11/02/18  Yes Xayla Puzio, Eilleen KempfMiguel Jose, MD  liraglutide (VICTOZA) 18 MG/3ML SOPN Inject 0.1 mLs (0.6 mg total) into the skin daily. Increase to 1.2mg  daily after first week. 11/02/18  Yes Annis Lagoy, Eilleen KempfMiguel Jose, MD  metFORMIN (GLUCOPHAGE) 500 MG tablet Take 1 tablet (500 mg total) by mouth 2 (two) times daily with a meal. 11/02/18  Yes Chesnee Floren, Eilleen KempfMiguel Jose, MD  rosuvastatin (CRESTOR) 10 MG tablet Take 1 tablet (10 mg total) by mouth daily. 11/02/18  Yes Arta Stump, Eilleen KempfMiguel Jose, MD  fluticasone [facility](FLONASE) 50 MCG/ACT nasal spray Place 2 sprays into both nostrils daily. 05/03/16 05/03/17  Enid DerryWagner, Ashley, PA-C    No Known Allergies  Patient Active Problem List   Diagnosis Date Noted  . Uncontrolled type 2 diabetes mellitus with hyperglycemia (HCC) 11/02/2018  . Chronic pain of both knees 11/02/2018    Past Medical History:  Diagnosis Date  . Diabetes mellitus     No past surgical history on file.  Social History   Socioeconomic History  . Marital status: Married    Spouse name: Not on file  . Number of children: Not on file  . Years of education: Not on file  . Highest education level: Not on file  Occupational History  . Not on file  Social Needs  . Financial resource strain: Not on file  . Food insecurity    Worry: Not on file    Inability: Not on file  . Transportation needs    Medical: Not on file    Non-medical: Not on file  Tobacco Use  . Smoking status: Never Smoker  . Smokeless tobacco: Never Used  Substance and Sexual Activity  . Alcohol use: No  . Drug use: No  . Sexual activity: Not on file  Lifestyle  . Physical activity    Days per week: Not on file    Minutes per session: Not on file  . Stress: Not on file  Relationships  . Social Musicianconnections    Talks on phone: Not on file    Gets together: Not on file    Attends religious service: Not on file    Active member of club or organization: Not on file    Attends meetings of clubs or organizations: Not on file    Relationship status: Not on file  .  Intimate partner violence    Fear of current or ex partner: Not on file    Emotionally abused: Not on file    Physically abused: Not on file    Forced sexual activity: Not on file  Other Topics Concern  . Not on file  Social History Narrative  . Not on file    No family history on file.   Review of Systems  Constitutional: Negative.  Negative for chills, fever and weight loss.  HENT: Negative.  Negative for congestion and sore throat.   Eyes: Negative.  Negative for blurred vision and double vision.  Respiratory: Negative.  Negative for cough and shortness of breath.   Cardiovascular: Negative.  Negative for chest pain and palpitations.  Gastrointestinal: Negative for abdominal pain, blood in stool, nausea and vomiting.  Genitourinary: Negative.  Negative for dysuria and hematuria.  Musculoskeletal: Negative.   Negative for myalgias and neck pain.  Skin: Negative.  Negative for rash.  Neurological: Negative.  Negative for dizziness and headaches.  Endo/Heme/Allergies: Negative.   All other systems reviewed and are negative.  Vitals:   11/30/18 1350  BP: 118/79  Pulse: 90  Resp: 16  Temp: 98.5 F (36.9 C)  SpO2: 97%     Physical Exam Vitals signs reviewed.  Constitutional:      Appearance: Normal appearance.  HENT:     Head: Normocephalic.     Mouth/Throat:     Mouth: Mucous membranes are moist.     Pharynx: Oropharynx is clear.  Eyes:     Extraocular Movements: Extraocular movements intact.     Conjunctiva/sclera: Conjunctivae normal.     Pupils: Pupils are equal, round, and reactive to light.  Neck:     Musculoskeletal: Normal range of motion and neck supple.  Cardiovascular:     Rate and Rhythm: Normal rate and regular rhythm.     Pulses: Normal pulses.     Heart sounds: Normal heart sounds.  Pulmonary:     Effort: Pulmonary effort is normal.     Breath sounds: Normal breath sounds.  Abdominal:     General: There is no distension.     Palpations: Abdomen is soft.     Tenderness: There is no abdominal tenderness.  Musculoskeletal: Normal range of motion.  Skin:    General: Skin is warm and dry.     Capillary Refill: Capillary refill takes less than 2 seconds.  Neurological:     General: No focal deficit present.     Mental Status: He is alert and oriented to person, place, and time.  Psychiatric:        Mood and Affect: Mood normal.        Behavior: Behavior normal.    Results for orders placed or performed in visit on 11/30/18 (from the past 24 hour(s))  POCT glycosylated hemoglobin (Hb A1C)     Status: Abnormal   Collection Time: 11/30/18  2:11 PM  Result Value Ref Range   Hemoglobin A1C 13.4 (A) 4.0 - 5.6 %   HbA1c POC (<> result, manual entry)     HbA1c, POC (prediabetic range)     HbA1c, POC (controlled diabetic range)    POCT glucose (manual entry)      Status: Abnormal   Collection Time: 11/30/18  2:43 PM  Result Value Ref Range   POC Glucose 158 (A) 70 - 99 mg/dl     ASSESSMENT & PLAN: Uncontrolled type 2 diabetes mellitus with hyperglycemia (HCC) Diabetes still uncontrolled.  Continue present  medications.  Will refer to endocrinologist for evaluation. Follow-up in 3 months.  Aaron FallenKenneth was seen today for diabetes.  Diagnoses and all orders for this visit:  Uncontrolled type 2 diabetes mellitus with hyperglycemia (HCC) -     POCT glycosylated hemoglobin (Hb A1C) -     Microalbumin, urine -     POCT glucose (manual entry) -     Ambulatory referral to Endocrinology  Dyslipidemia associated with type 2 diabetes mellitus [facility](HCC)    Patient Instructions       If you have lab work done today you will be contacted with your lab results within the next 2 weeks.  If you have not heard from us then please contact us. The fastest way to get your results is to register for My Chart.   IF you received an x-ray today, you will receive an invoice from [facility]. Please contact [facility] at [phone] with questions or concerns regarding your invoice.   IF you received labwork today, you will receive an invoice from [facility]. Please contact [facility] at [phone] with questions or concerns regarding your invoice.   Our billing staff will not be able to assist you with questions regarding bills from these companies.  You will be contacted with the lab results as soon as they are available. The fastest way to get your results is to activate your My Chart account. Instructions are located on the last page of this paperwork. If you have not heard from us regarding the results in 2 weeks, please contact this office.     Diabetes Mellitus and Nutrition, Adult When you have diabetes (diabetes mellitus), it is very important to have healthy eating habits because your blood sugar (glucose) levels are greatly affected by  what you eat and drink. Eating healthy foods in the appropriate amounts, at about the same times every day, can help you:  Control your blood glucose.  Lower your risk of heart disease.  Improve your blood pressure.  Reach or maintain a healthy weight. Every person with diabetes is different, and each person has different needs for a meal plan. Your health care provider may recommend that you work with a diet and nutrition specialist (dietitian) to make a meal plan that is best for you. Your meal plan may vary depending on factors such as:  The calories you need.  The medicines you take.  Your weight.  Your blood glucose, blood pressure, and cholesterol levels.  Your activity level.  Other health conditions you have, such as heart or kidney disease. How do carbohydrates affect me? Carbohydrates, also called carbs, affect your blood glucose level more than any other type of food. Eating carbs naturally raises the amount of glucose in your blood. Carb counting is a method for keeping track of how many carbs you eat. Counting carbs is important to keep your blood glucose at a healthy level, especially if you use insulin or take certain oral diabetes medicines. It is important to know how many carbs you can safely have in each meal. This is different for every person. Your dietitian can help you calculate how many carbs you should have at each meal and for each snack. Foods that contain carbs include:  Bread, cereal, rice, pasta, and crackers.  Potatoes and corn.  Peas, beans, and lentils.  Milk and yogurt.  Fruit and juice.  Desserts, such as cakes, cookies, ice cream, and candy. How does alcohol affect me? Alcohol can cause a sudden decrease in blood glucose (  hypoglycemia), especially if you use insulin or take certain oral diabetes medicines. Hypoglycemia can be a life-threatening condition. Symptoms of hypoglycemia (sleepiness, dizziness, and confusion) are similar to symptoms  of having too much alcohol. If your health care provider says that alcohol is safe for you, follow these guidelines:  Limit alcohol intake to no more than 1 drink per day for nonpregnant women and 2 drinks per day for men. One drink equals 12 oz of beer, 5 oz of wine, or 1 oz of hard liquor.  Do not drink on an empty stomach.  Keep yourself hydrated with water, diet soda, or unsweetened iced tea.  Keep in mind that regular soda, juice, and other mixers may contain a lot of sugar and must be counted as carbs. What are tips for following this plan?  Reading food labels  Start by checking the serving size on the "Nutrition Facts" label of packaged foods and drinks. The amount of calories, carbs, fats, and other nutrients listed on the label is based on one serving of the item. Many items contain more than one serving per package.  Check the total grams (g) of carbs in one serving. You can calculate the number of servings of carbs in one serving by dividing the total carbs by 15. For example, if a food has 30 g of total carbs, it would be equal to 2 servings of carbs.  Check the number of grams (g) of saturated and trans fats in one serving. Choose foods that have low or no amount of these fats.  Check the number of milligrams (mg) of salt (sodium) in one serving. Most people should limit total sodium intake to less than 2,300 mg per day.  Always check the nutrition information of foods labeled as "low-fat" or "nonfat". These foods may be higher in added sugar or refined carbs and should be avoided.  Talk to your dietitian to identify your daily goals for nutrients listed on the label. Shopping  Avoid buying canned, premade, or processed foods. These foods tend to be high in fat, sodium, and added sugar.  Shop around the outside edge of the grocery store. This includes fresh fruits and vegetables, bulk grains, fresh meats, and fresh dairy. Cooking  Use low-heat cooking methods, such as  baking, instead of high-heat cooking methods like deep frying.  Cook using healthy oils, such as olive, canola, or sunflower oil.  Avoid cooking with butter, cream, or high-fat meats. Meal planning  Eat meals and snacks regularly, preferably at the same times every day. Avoid going long periods of time without eating.  Eat foods high in fiber, such as fresh fruits, vegetables, beans, and whole grains. Talk to your dietitian about how many servings of carbs you can eat at each meal.  Eat 4-6 ounces (oz) of lean protein each day, such as lean meat, chicken, fish, eggs, or tofu. One oz of lean protein is equal to: ? 1 oz of meat, chicken, or fish. ? 1 egg. ?  cup of tofu.  Eat some foods each day that contain healthy fats, such as avocado, nuts, seeds, and fish. Lifestyle  Check your blood glucose regularly.  Exercise regularly as told by your health care provider. This may include: ? 150 minutes of moderate-intensity or vigorous-intensity exercise each week. This could be brisk walking, biking, or water aerobics. ? Stretching and doing strength exercises, such as yoga or weightlifting, at least 2 times a week.  Take medicines as told by your health care provider.  Do not use any products that contain nicotine or tobacco, such as cigarettes and e-cigarettes. If you need help quitting, ask your health care provider.  Work with a Social worker or diabetes educator to identify strategies to manage stress and any emotional and social challenges. Questions to ask a health care provider  Do I need to meet with a diabetes educator?  Do I need to meet with a dietitian?  What number can I call if I have questions?  When are the best times to check my blood glucose? Where to find more information:  American Diabetes Association: diabetes.org  Academy of Nutrition and Dietetics: www.eatright.[employer] of Diabetes and Digestive and Kidney Diseases (NIH): [employer]  Summary  A healthy meal plan will help you control your blood glucose and maintain a healthy lifestyle.  Working with a diet and nutrition specialist (dietitian) can help you make a meal plan that is best for you.  Keep in mind that carbohydrates (carbs) and alcohol have immediate effects on your blood glucose levels. It is important to count carbs and to use alcohol carefully. This information is not intended to replace advice given to you by your health care provider. Make sure you discuss any questions you have with your health care provider. Document Released: 12/05/2004 Document Revised: 02/20/2017 Document Reviewed: 04/14/2016 Elsevier Patient Education  2020 Elsevier Inc.      Agustina Caroli, MD [facility]

## 2018-11-30 NOTE — Assessment & Plan Note (Signed)
Diabetes still uncontrolled.  Continue present medications.  Will refer to endocrinologist for evaluation. Follow-up in 3 months.

## 2018-12-01 LAB — MICROALBUMIN, URINE: Microalbumin, Urine: 50.8 ug/mL

## 2018-12-03 ENCOUNTER — Ambulatory Visit (INDEPENDENT_AMBULATORY_CARE_PROVIDER_SITE_OTHER): Payer: 59 | Admitting: Orthopedic Surgery

## 2018-12-03 ENCOUNTER — Encounter: Payer: Self-pay | Admitting: Orthopedic Surgery

## 2018-12-03 DIAGNOSIS — S83272D Complex tear of lateral meniscus, current injury, left knee, subsequent encounter: Secondary | ICD-10-CM | POA: Diagnosis not present

## 2018-12-04 ENCOUNTER — Encounter: Payer: Self-pay | Admitting: Orthopedic Surgery

## 2018-12-04 NOTE — Progress Notes (Signed)
Office Visit Note   Patient: Aaron Woods           Date of Birth: 06/05/1985           MRN: [id number] Visit Date: 12/03/2018 Requested by: Horald Pollen, MD [place],  [place] [address] PCP: Horald Pollen, MD  Subjective: Chief Complaint  Patient presents with  . Follow-up    HPI: [name] is a patient with left knee pain.  Since I have seen him he had an MRI scan.  That scan is reviewed and it does show horizontal tear of the posterior horn and body junction of the lateral meniscus with a small meniscal cyst associated with that.  Medial and patellofemoral compartments intact.  Not much in the way of significant arthritis.  He reports continued pain.  Symptoms have been going on now for about a year.  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  He does have the capability of doing more sedentary type work.  He has been in this current job for about 7 months.  No personal or family history of DVT or pulmonary embolism              ROS: All systems reviewed are negative as they relate to the chief complaint within the history of present illness.  Patient denies  fevers or chills.   Assessment & Plan: Visit Diagnoses:  1. Complex tear of lateral meniscus of left knee, unspecified whether old or current tear, subsequent encounter     Plan: Impression is left knee lateral meniscal tear horizontal cleavage type degenerative in nature with small early cyst present.  Based on the configuration of this tear I think that debridement of the central portion with repair of the more peripheral portion to maintain the functional viability of that meniscus is indicated.  I think it is possible we could do this as an all inside technique with the Biomet suture repair device.  We would also need to decompress that small cyst at the time of surgery.  Alternatively debridement of either one leaf or the other would be indicated if those limbs were  unstable.  Patient understands the risk and benefits of surgery including not limited to infection nerve vessel damage development of arthritis as well as incomplete pain relief.  Patient understands the risk and benefits and wishes to proceed.  All questions answered.  I did discuss this at length with his wife who is on a face time call during the clinic visit as well.  Follow-Up Instructions: No follow-ups on file.   Orders:  No orders of the defined types were placed in this encounter.  No orders of the defined types were placed in this encounter.     Procedures: No procedures performed   Clinical Data: No additional findings.  Objective: Vital Signs: There were no vitals taken for this visit.  Physical Exam:   Constitutional: Patient appears well-developed HEENT:  Head: Normocephalic Eyes:EOM are normal Neck: Normal range of motion Cardiovascular: Normal rate Pulmonary/chest: Effort normal Neurologic: Patient is alert Skin: Skin is warm Psychiatric: Patient has normal mood and affect    Ortho Exam: Ortho exam demonstrates lateral joint line tenderness in the left knee.  Extensor mechanism is intact collateral cruciate ligaments are stable not much of an effusion is present.  Pedal pulses palpable.  Specialty Comments:  No specialty comments available.  Imaging: No results found.   PMFS History: Patient Active  Problem List   Diagnosis Date Noted  . Dyslipidemia associated with type 2 diabetes mellitus (HCC) 11/30/2018  . Uncontrolled type 2 diabetes mellitus with hyperglycemia (HCC) 11/02/2018  . Chronic pain of both knees 11/02/2018   Past Medical History:  Diagnosis Date  . Diabetes mellitus     History reviewed. No pertinent family history.  History reviewed. No pertinent surgical history. Social History   Occupational History  . Not on file  Tobacco Use  . Smoking status: Never Smoker  . Smokeless tobacco: Never Used  Substance and Sexual  Activity  . Alcohol use: No  . Drug use: No  . Sexual activity: Not on file

## 2018-12-14 ENCOUNTER — Telehealth: Payer: Self-pay | Admitting: Orthopedic Surgery

## 2018-12-14 NOTE — Progress Notes (Signed)
[address], [place]. [place] [place] [address] Phone: [phone] Fax: [phone]      Your procedure is scheduled on Tuesday, 12/21/2018.  Report to [facility] Main Entrance "A" at 1:20 P.M., and check in at the Admitting office.   Call this number if you have problems the morning of surgery:  [phone]  Call [phone] if you have any questions prior to your surgery date Monday-Friday 8am-4pm    Remember:  Do not eat after midnight the night before your surgery  You may drink clear liquids until 12:20 the day of your surgery.   Clear liquids allowed are: Water, Non-Citrus Juices (without pulp), Carbonated Beverages, Clear Tea, Black Coffee Only, and Gatorade  Please complete your PRE-SURGERY G2 that was provided to you by 12:20 the day of surgery.  Please, if able, drink it in one sitting. DO NOT SIP.     Take these medicines the morning of surgery with A SIP OF WATER: Rosuvastatin (Crestor)   WHAT DO I DO ABOUT MY DIABETES MEDICATION?  Marland Kitchen Do not take oral diabetes medicines (pills) the morning of surgery. - Do NOT take Metformin (Glucophage) the day of surgery  . THE NIGHT BEFORE SURGERY, take 32 units of insulin detemir (Levemir).      . The day of surgery, do not take other diabetes injectables, including Victoza (liraglutide).   How to Manage Your Diabetes Before and After Surgery  Why is it important to control my blood sugar before and after surgery? . Improving blood sugar levels before and after surgery helps healing and can limit problems. . A way of improving blood sugar control is eating a healthy diet by: o  Eating less sugar and carbohydrates o  Increasing activity/exercise o  Talking with your doctor about reaching your blood sugar goals . High blood sugars (greater than 180 mg/dL) can raise your risk of infections and slow your recovery, so you will need to focus on controlling your  diabetes during the weeks before surgery. . Make sure that the doctor who takes care of your diabetes knows about your planned surgery including the date and location.  How do I manage my blood sugar before surgery? . Check your blood sugar at least 4 times a day, starting 2 days before surgery, to make sure that the level is not too high or low. o Check your blood sugar the morning of your surgery when you wake up and every 2 hours until you get to the Short Stay unit. . If your blood sugar is less than 70 mg/dL, you will need to treat for low blood sugar: o Do not take insulin. o Treat a low blood sugar (less than 70 mg/dL) with  cup of clear juice (cranberry or apple), 4 glucose tablets, OR glucose gel. o Recheck blood sugar in 15 minutes after treatment (to make sure it is greater than 70 mg/dL). If your blood sugar is not greater than 70 mg/dL on recheck, call [phone] for further instructions. . Report your blood sugar to the short stay nurse when you get to Short Stay.  . If you are admitted to the hospital after surgery: o Your blood sugar will be checked by the staff and you will probably be given insulin after surgery (instead of oral diabetes medicines) to make sure you have good blood sugar levels. o The goal for blood sugar control after surgery is 80-180 mg/dL.   7 days  prior to surgery STOP taking any Aspirin (unless otherwise instructed by your surgeon), Aleve, Naproxen, Ibuprofen, Motrin, Advil, Goody's, BC's, all herbal medications, fish oil, and all vitamins.    The Morning of Surgery  Do not wear jewelry.  Do not wear lotions, powders, colognes, or deodorant  Men may shave face and neck.  Do not bring valuables to the hospital.  [facility] is not responsible for any belongings or valuables.  If you are a smoker, DO NOT Smoke 24 hours prior to surgery  If you wear a CPAP at night please bring your mask, tubing, and machine the morning of surgery   Remember that  you must have someone to transport you home after your surgery, and remain with you for 24 hours if you are discharged the same day.   Contacts, eyeglasses, hearing aids, dentures or bridgework may not be worn into surgery.    Leave your suitcase in the car.  After surgery it may be brought to your room.  For patients admitted to the hospital, discharge time will be determined by your treatment team.  Patients discharged the day of surgery will not be allowed to drive home.    Special instructions:   [place]- Preparing For Surgery  Before surgery, you can play an important role. Because skin is not sterile, your skin needs to be as free of germs as possible. You can reduce the number of germs on your skin by washing with CHG (chlorahexidine gluconate) Soap before surgery.  CHG is an antiseptic cleaner which kills germs and bonds with the skin to continue killing germs even after washing.    Oral Hygiene is also important to reduce your risk of infection.  Remember - BRUSH YOUR TEETH THE MORNING OF SURGERY WITH YOUR REGULAR TOOTHPASTE  Please do not use if you have an allergy to CHG or antibacterial soaps. If your skin becomes reddened/irritated stop using the CHG.  Do not shave (including legs and underarms) for at least 48 hours prior to first CHG shower. It is OK to shave your face.  Please follow these instructions carefully.   1. Shower the NIGHT BEFORE SURGERY and the MORNING OF SURGERY with CHG Soap.   2. If you chose to wash your hair, wash your hair first as usual with your normal shampoo.  3. After you shampoo, rinse your hair and body thoroughly to remove the shampoo.  4. Use CHG as you would any other liquid soap. You can apply CHG directly to the skin and wash gently with a scrungie or a clean washcloth.   5. Apply the CHG Soap to your body ONLY FROM THE NECK DOWN.  Do not use on open wounds or open sores. Avoid contact with your eyes, ears, mouth and genitals  (private parts). Wash Face and genitals (private parts)  with your normal soap.   6. Wash thoroughly, paying special attention to the area where your surgery will be performed.  7. Thoroughly rinse your body with warm water from the neck down.  8. DO NOT shower/wash with your normal soap after using and rinsing off the CHG Soap.  9. Pat yourself dry with a CLEAN TOWEL.  10. Wear CLEAN PAJAMAS to bed the night before surgery, wear comfortable clothes the morning of surgery  11. Place CLEAN SHEETS on your bed the night of your first shower and DO NOT SLEEP WITH PETS.    Day of Surgery:  Please shower the morning of surgery with the CHG  soap  Do not apply any deodorants/lotions. Please wear clean clothes to the hospital/surgery center.   Remember to brush your teeth WITH YOUR REGULAR TOOTHPASTE.   Please read over the following fact sheets that you were given.

## 2018-12-14 NOTE — Telephone Encounter (Signed)
Patient is scheduled for left knee arthrosocpy, debridement, possible lateral meniscal tear repair on September 29th at 3:20pm. Patient has a history of uncontrolled diabetes with a hemoglobin A1c at 13.4 on 11-02-18.  Patient taking Metformin & Levemir insulin.  Please see Dr. Hilbert Odor note from 11-30-18.  Pre admission is calling because patient has PAT tomorrow.  Please advise if patient needs to be rescheduled.

## 2018-12-14 NOTE — Telephone Encounter (Signed)
Thank you for dealing with that.

## 2018-12-14 NOTE — Progress Notes (Signed)
Contacted Debbie at Dr. Randel Pigg office regarding patient's A1c 13.4 on 11/30/2018 at PCP's office.  Debbie stated she would let Dr. Marlou Sa know.  I requested she notify Baker Janus if surgery was going to be cancelled.

## 2018-12-14 NOTE — Telephone Encounter (Signed)
FYI I advised Aaron Woods should not proceed with surgery until A1C was down significantly.

## 2018-12-15 ENCOUNTER — Inpatient Hospital Stay (HOSPITAL_COMMUNITY)
Admission: RE | Admit: 2018-12-15 | Discharge: 2018-12-15 | Disposition: A | Discharge status: Home / Self Care | Payer: 59 | Source: Ambulatory Visit

## 2018-12-17 ENCOUNTER — Other Ambulatory Visit (HOSPITAL_COMMUNITY): Payer: 59

## 2018-12-21 ENCOUNTER — Encounter (HOSPITAL_COMMUNITY): Admission: RE | Payer: Self-pay | Source: Home / Self Care

## 2018-12-21 ENCOUNTER — Ambulatory Visit (HOSPITAL_COMMUNITY): Admission: RE | Admit: 2018-12-21 | Payer: 59 | Source: Home / Self Care | Admitting: Orthopedic Surgery

## 2018-12-21 SURGERY — ARTHROSCOPY, KNEE
Anesthesia: General | Site: Knee | Laterality: Left

## 2018-12-29 ENCOUNTER — Inpatient Hospital Stay: Payer: 59 | Admitting: Orthopedic Surgery

## 2018-12-31 ENCOUNTER — Other Ambulatory Visit: Payer: Self-pay

## 2019-01-04 ENCOUNTER — Encounter: Payer: Self-pay | Admitting: Internal Medicine

## 2019-01-04 ENCOUNTER — Ambulatory Visit: Payer: 59 | Admitting: Internal Medicine

## 2019-01-04 ENCOUNTER — Telehealth: Payer: Self-pay

## 2019-01-04 ENCOUNTER — Ambulatory Visit (INDEPENDENT_AMBULATORY_CARE_PROVIDER_SITE_OTHER): Payer: 59 | Admitting: Internal Medicine

## 2019-01-04 ENCOUNTER — Other Ambulatory Visit: Payer: Self-pay

## 2019-01-04 VITALS — BP 132/88 | HR 92 | Temp 98.2°F | Ht 69.0 in | Wt 222.2 lb

## 2019-01-04 DIAGNOSIS — E785 Hyperlipidemia, unspecified: Secondary | ICD-10-CM

## 2019-01-04 DIAGNOSIS — E1165 Type 2 diabetes mellitus with hyperglycemia: Secondary | ICD-10-CM

## 2019-01-04 LAB — GLUCOSE, POCT (MANUAL RESULT ENTRY): POC Glucose: 240 mg/dl — AB (ref 70–99)

## 2019-01-04 MED ORDER — NOVOLOG MIX 70/30 FLEXPEN (70-30) 100 UNIT/ML ~~LOC~~ SUPN: 24.0000 [IU] | PEN_INJECTOR | Freq: Two times a day (BID) | SUBCUTANEOUS | 11 refills | Status: DC

## 2019-01-04 MED ORDER — INSULIN PEN NEEDLE 31G X 5 MM MISC: 1.0000 | Freq: Two times a day (BID) | 11 refills | Status: DC

## 2019-01-04 NOTE — Patient Instructions (Addendum)
-   STOP Levemir  - START  Novolog Mix 24 units With Breakfast and 24 units with Supper  - Continue Victoza 1.2 mg daily  - Continue Metformin 500 mg daily with Breakfast     Choose healthy, lower carb lower calorie snacks: toss salad, cooked vegetables, cottage cheese, peanut butter, low fat cheese / string cheese, lower sodium deli meat, tuna salad or chicken salad     HOW TO TREAT LOW BLOOD SUGARS (Blood sugar LESS THAN 70 MG/DL)  Please follow the RULE OF 15 for the treatment of hypoglycemia treatment (when your (blood sugars are less than 70 mg/dL)    STEP 1: Take 15 grams of carbohydrates when your blood sugar is low, which includes:   3-4 GLUCOSE TABS  OR  3-4 OZ OF JUICE OR REGULAR SODA OR  ONE TUBE OF GLUCOSE GEL     STEP 2: RECHECK blood sugar in 15 MINUTES STEP 3: If your blood sugar is still low at the 15 minute recheck --> then, go back to STEP 1 and treat AGAIN with another 15 grams of carbohydrates.

## 2019-01-04 NOTE — Telephone Encounter (Signed)
Attempted to call pt back, I had explained to him during his visit that he was being seen here for his diabetes only.

## 2019-01-04 NOTE — Telephone Encounter (Signed)
Patient has an appt for 03/01/19 with PCP patient wants to know does he need to cancel appt now that he see's Dr. Kelton Pillar   Please advise

## 2019-01-04 NOTE — Progress Notes (Signed)
Name: Aaron Woods  MRN/ DOB: [id number], 1985-11-09   Age/ Sex: 33 y.o., male    PCP: Horald Pollen, MD   Reason for Endocrinology Evaluation: Type 2 Diabetes Mellitus     Date of Initial Endocrinology Visit: 01/04/2019     PATIENT IDENTIFIER: [name] is a 33 y.o. male with a past medical history of T2Dm and dyslipidemia . The patient presented for initial endocrinology clinic visit on 01/04/2019 for consultative assistance with his diabetes management.    HPI: Aaron Woods was    Diagnosed with DM in 2006 Prior Medications tried/Intolerance: In olerant to higher doses of metformin  Currently checking blood sugars 2 x / day,  before breakfast and bedtime .  Hypoglycemia episodes : no           Hemoglobin A1c has ranged from 7.0%  , peaking at 13.4% in 2020. Patient required assistance for hypoglycemia:  Patient has required hospitalization within the last 1 year from hyper or hypoglycemia:   In terms of diet, the patient eats 2 meals and snacks 3x a day . Does not drink sugar-sweetened beverages.   Misses medications 2x a week    Lives with wife , 3 kids (12,10,8)   Works in [employer]   HOME DIABETES REGIMEN: Levemir 65 units daily  Victoza 1.6 mg daily  Metformin 500 mg 1 tablet daily    Statin: yes ACE-I/ARB: no Prior Diabetic Education: Yes   METER DOWNLOAD SUMMARY: Did not bring    DIABETIC COMPLICATIONS: Microvascular complications:    Denies: retinopathy, neuropathy and CKD   Last eye exam: Completed 2019  Macrovascular complications:    Denies: CAD, PVD, CVA   PAST HISTORY: Past Medical History:  Past Medical History:  Diagnosis Date  . Diabetes mellitus     Past Surgical History: No past surgical history on file.   Social History:  reports that he has never smoked. He has never used smokeless tobacco. He reports that he does not drink alcohol or use drugs.  Family History: No family history on file.    HOME MEDICATIONS: Allergies as of 01/04/2019   No Known Allergies     Medication List       Accurate as of January 04, 2019 11:59 AM. If you have any questions, ask your nurse or doctor.        STOP taking these medications   fluticasone 50 MCG/ACT nasal spray Commonly known as: Flonase Stopped by: Dorita Sciara, MD   insulin detemir 100 UNIT/ML injection Commonly known as: LEVEMIR Stopped by: Dorita Sciara, MD     TAKE these medications   blood glucose meter kit and supplies by Other route as directed. Dispense based on patient and insurance preference. Use up to four times daily as directed. (FOR ICD-10 E10.9, E11.9).   Insulin Pen Needle 31G X 5 MM Misc 1 Device by Does not apply route 2 (two) times daily. Started by: Dorita Sciara, MD   ketoconazole 2 % shampoo Commonly known as: NIZORAL Apply 1 application topically 2 (two) times a week.   liraglutide 18 MG/3ML Sopn Commonly known as: VICTOZA Inject 0.1 mLs (0.6 mg total) into the skin daily. Increase to 1.90m daily after first week. What changed:   how much to take  additional instructions   metFORMIN 500 MG tablet Commonly known as: GLUCOPHAGE Take 1 tablet (500 mg total) by mouth 2 (two) times daily with a meal. What changed: when to take  this   NovoLOG Mix 70/30 FlexPen (70-30) 100 UNIT/ML FlexPen Generic drug: insulin aspart protamine - aspart Inject 0.24 mLs (24 Units total) into the skin 2 (two) times daily with a meal. Started by: Dorita Sciara, MD   rosuvastatin 10 MG tablet Commonly known as: Crestor Take 1 tablet (10 mg total) by mouth daily.        ALLERGIES: No Known Allergies   REVIEW OF SYSTEMS: A comprehensive ROS was conducted with the patient and is negative except as per HPI and below:  ROS    OBJECTIVE:   VITAL SIGNS: BP 132/88 (BP Location: Left Arm, Patient Position: Sitting, Cuff Size: Normal)   Pulse 92   Temp 98.2 F (36.8 C)   Ht 5'  9" (1.753 m)   Wt 222 lb 3.2 oz (100.8 kg)   SpO2 97%   BMI 32.81 kg/m    PHYSICAL EXAM:  General: Pt appears well and is in NAD  Hydration: Well-hydrated with moist mucous membranes and good skin turgor  HEENT: Head: Unremarkable with good dentition. Oropharynx clear without exudate.  Eyes: External eye exam normal without stare, lid lag or exophthalmos.  EOM intact.    Neck: General: Supple without adenopathy or carotid bruits. Thyroid: Thyroid size normal.  No goiter or nodules appreciated. No thyroid bruit.  Lungs: Clear with good BS bilat with no rales, rhonchi, or wheezes  Heart: RRR with normal S1 and S2 and no gallops; no murmurs; no rub  Abdomen: Normoactive bowel sounds, soft, nontender, without masses or organomegaly palpable  Extremities:  Lower extremities - No pretibial edema. No lesions.  Skin: Normal texture and temperature to palpation. lipohypertrophy noted at the LLQ of the abdominal wall.   Neuro: MS is good with appropriate affect, pt is alert and Ox3    DM foot exam: 01/04/2019  The skin of the feet is intact without sores or ulcerations. The pedal pulses are 2+ on right and 2+ on left. The sensation is intact to a screening 5.07, 10 gram monofilament bilaterally   DATA REVIEWED:  Lab Results  Component Value Date   HGBA1C 13.4 (A) 11/30/2018   HGBA1C 13.4 (H) 11/02/2018   Lab Results  Component Value Date   MICROALBUR 0.50 04/11/2009   LDLCALC 151 (H) 11/02/2018   CREATININE 0.71 (L) 11/02/2018   No results found for: [facility]  Lab Results  Component Value Date   CHOL 210 (H) 11/02/2018   HDL 42 11/02/2018   LDLCALC 151 (H) 11/02/2018   TRIG 84 11/02/2018   CHOLHDL 5.0 11/02/2018        ASSESSMENT / PLAN / RECOMMENDATIONS:   1) Type 2 Diabetes Mellitus, Poorly controlled, Without complications - Most recent A1c of 13.4 %. Goal A1c < 7.0 %.    Plan: GENERAL: I have discussed with the patient the pathophysiology of diabetes. We went  over the natural progression of the disease. We talked about both insulin resistance and insulin deficiency. We stressed the importance of lifestyle changes including diet and exercise. I explained the complications associated with diabetes including retinopathy, nephropathy, neuropathy as well as increased risk of cardiovascular disease. We went over the benefit seen with glycemic control.    I explained to the patient that diabetic patients are at higher than normal risk for amputations.   Discussed pharmacokinetics of basal/bolus insulin   We also discussed avoiding sugar-sweetened beverages and snacks, when possible.   Pt advised to rotate injection sites and avoid the lipohypertrophic area at the  LLQ for the next 6 months.    MEDICATIONS: - STOP Levemir  - START  Novolog Mix 24 units With Breakfast and 24 units with Supper  - Continue Victoza 1.2 mg daily  - Continue Metformin 500 mg daily with Breakfast    EDUCATION / INSTRUCTIONS:  BG monitoring instructions: Patient is instructed to check his blood sugars 2 times a day, fasting and supper .  Call [facility] if: BG persistently < 70 or > 300. . I reviewed the Rule of 15 for the treatment of hypoglycemia in detail with the patient. Literature supplied.   2) Diabetic complications:   Eye: Does not have known diabetic retinopathy.   Neuro/ Feet: Does not have known diabetic peripheral neuropathy.  Renal: Patient does not have known baseline CKD. He is not on an ACEI/ARB at present.   3) Lipids: Patient is on a statin. Last LDL above goal, most likely due to non-compliance.    F/U in 2 months    Signed electronically by: Mack Guise, MD  [facility]  [address] [place], [place] [address] Phone: [phone] FAX: [phone]   CC: Horald Pollen, MD [place] [address] Phone: [phone]  Fax: [phone]     Return to Endocrinology clinic as below: Future Appointments  Date Time Provider [place]  03/01/2019 11:20 AM Horald Pollen, MD PCP-PCP [facility]  03/04/2019  3:20 PM Shamleffer, Melanie Crazier, MD [facility] None

## 2019-01-04 NOTE — Telephone Encounter (Signed)
Will call the patient back and let him know this thank you

## 2019-01-05 ENCOUNTER — Inpatient Hospital Stay: Payer: 59 | Admitting: Orthopedic Surgery

## 2019-01-07 ENCOUNTER — Other Ambulatory Visit: Payer: Self-pay

## 2019-01-07 ENCOUNTER — Telehealth: Payer: Self-pay | Admitting: Internal Medicine

## 2019-01-07 MED ORDER — INSULIN LISPRO (1 UNIT DIAL) 100 UNIT/ML (KWIKPEN): 24.0000 [IU] | PEN_INJECTOR | Freq: Two times a day (BID) | SUBCUTANEOUS | 11 refills | Status: DC

## 2019-01-07 NOTE — Telephone Encounter (Signed)
Spoke to pt, apparently novolog is not covered so switched to humalog.

## 2019-01-07 NOTE — Telephone Encounter (Signed)
Patient ph# [phone] called re: Patient spoke with [facility] [address] told patient that the [facility] has sent over request forms for Novolog Mix 70/30 but have not received the request forms back. Pharmacist told patient that Dr. Kelton Pillar needs to call patient's insurance company because insurance company will not pay for the above medication.

## 2019-02-10 ENCOUNTER — Other Ambulatory Visit: Payer: Self-pay

## 2019-02-10 DIAGNOSIS — Z20822 Contact with and (suspected) exposure to covid-19: Secondary | ICD-10-CM

## 2019-02-12 LAB — NOVEL CORONAVIRUS, NAA: SARS-CoV-2, NAA: NOT DETECTED

## 2019-03-01 ENCOUNTER — Ambulatory Visit: Payer: 59 | Admitting: Emergency Medicine

## 2019-03-04 ENCOUNTER — Ambulatory Visit: Payer: 59 | Admitting: Internal Medicine

## 2019-04-11 ENCOUNTER — Ambulatory Visit: Payer: 59 | Admitting: Emergency Medicine

## 2019-04-13 LAB — HM DIABETES EYE EXAM

## 2019-04-18 ENCOUNTER — Other Ambulatory Visit: Payer: Self-pay

## 2019-04-19 NOTE — Progress Notes (Signed)
Name: Aaron Woods  Age/ Sex: 34 y.o., male   MRN/ DOB: [id number], 07/09/1985     PCP: Horald Pollen, MD   Reason for Endocrinology Evaluation: Type 2 Diabetes Mellitus  Initial Endocrine Consultative Visit: 01/04/2019    PATIENT IDENTIFIER: Aaron Woods is a 34 y.o. male with a past medical history of T2DM and Dyslipidemia. The patient has followed with Endocrinology clinic since 01/04/2019 for consultative assistance with management of his diabetes.  DIABETIC HISTORY:  Aaron Woods was diagnosed with T2DM in 2006. Pt intolerant to BID dosing of Metformin. His hemoglobin A1c has ranged from 7.0 % , peaking at 13.4% in 2020.  On his initial visit to our clinic his A1c was 13.4%. He was on Levemir, victoza and Metformin. We replaced Levemir with Novolog Mix.   Lives with wife , 3 kids (12,10,8)   Works in park [employer]   SUBJECTIVE:   During the last visit (01/04/2019): A1c 13.4%. Switched levemir to [employer], continued Victoza and Metformin.   Today (04/20/2019): Aaron Woods  is here for a follow up on diabetes management.   He checks his blood sugars 2 times daily, preprandial to breakfast and bedtime . The patient has not had hypoglycemic episodes .  Otherwise, the patient has not required any recent emergency interventions for hypoglycemia and has not had recent hospitalizations secondary to hyper or hypoglycemic episodes.    ROS: As per HPI and as detailed below: Review of Systems  Eyes: Negative for blurred vision and pain.  Respiratory: Negative for cough and shortness of breath.   Gastrointestinal: Negative for diarrhea and nausea.      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 Idolina Primer)  Denies:  neuropathy and CKD   Last eye exam: Completed 04/14/2019  Macrovascular complications:    Denies: CAD, PVD, CVA    HISTORY:  Past Medical History:  Past Medical History:  Diagnosis Date  . Diabetes mellitus     Past Surgical History: No past surgical history on file.  Social History:  reports that he has never smoked. He has never used smokeless tobacco. He reports that he does not drink alcohol or use drugs. Family History:  Family History  Problem Relation Age of Onset  . Diabetes Mother      HOME MEDICATIONS: Allergies as of 04/20/2019   No Known Allergies     Medication List       Accurate as of April 20, 2019  9:10 AM. If you have any questions, ask your nurse or doctor.        STOP taking these medications   insulin lispro 100 UNIT/ML KwikPen Commonly known as: HumaLOG KwikPen Stopped by: Dorita Sciara, MD   rosuvastatin 10 MG tablet Commonly known as: Crestor Stopped by: Dorita Sciara, MD     TAKE these medications   atorvastatin 20 MG tablet Commonly known as: LIPITOR Take 1 tablet (20 mg total) by mouth daily. Started by: Dorita Sciara, MD   blood glucose meter kit and supplies by Other route as directed. Dispense based on patient and insurance preference. Use up to four times daily as directed. (FOR ICD-10 E10.9, E11.9).  Insulin Lispro Prot & Lispro (75-25) 100 UNIT/ML Kwikpen Commonly known as: HumaLOG Mix 75/25 KwikPen Inject 26 Units into the skin daily with breakfast AND 24 Units daily with supper. Started by: Dorita Sciara, MD   Insulin Pen Needle 31G X 5 MM Misc 1 Device by Does not apply route 2 (two) times daily.   ketoconazole 2 % shampoo Commonly known as: NIZORAL Apply 1 application topically 2 (two) times a week.   liraglutide 18 MG/3ML Sopn Commonly known as: VICTOZA Inject 0.1 mLs (0.6 mg total) into the skin daily. Increase to 1.69m daily  after first week. What changed:   how much to take  additional instructions   metFORMIN 500 MG tablet Commonly known as: GLUCOPHAGE Take 1 tablet (500 mg total) by mouth daily with breakfast.        OBJECTIVE:   Vital Signs: BP (!) 142/90 (BP Location: Right Arm, Patient Position: Sitting, Cuff Size: Normal)   Pulse (!) 104   Temp 98.3 F (36.8 C)   Ht _0  (1.753 m)   Wt 228 lb (103.4 kg)   SpO2 98%   BMI 33.67 kg/m   Wt Readings from Last 3 Encounters:  04/20/19 228 lb (103.4 kg)  01/04/19 222 lb 3.2 oz (100.8 kg)  11/30/18 221 lb 6.4 oz (100.4 kg)     Exam: General: Pt appears well and is in NAD  Neck: General: Supple without adenopathy. Thyroid: Thyroid size normal.  No goiter or nodules appreciated. No thyroid bruit.  Lungs: Clear with good BS bilat with no rales, rhonchi, or wheezes  Heart: RRR with normal S1 and S2 and no gallops; no murmurs; no rub  Abdomen:  soft, nontender, without masses or organomegaly palpable  Extremities: No pretibial edema. No tremor.  Neuro: MS is good with appropriate affect, pt is alert and Ox3    DM foot exam: 04/20/2019  The skin of the feet is intact without sores or ulcerations. The pedal pulses are 2+ on right and 2+ on left. The sensation is intact to a screening 5.07, 10 gram monofilament bilaterally       DATA REVIEWED:  Lab Results  Component Value Date   HGBA1C 11.3 (A) 04/20/2019   HGBA1C 13.4 (A) 11/30/2018   HGBA1C 13.4 (H) 11/02/2018   Lab Results  Component Value Date   MICROALBUR 0.50 04/11/2009   LDLCALC 151 (H) 11/02/2018   CREATININE 0.71 (L) 11/02/2018    Lab Results  Component Value Date   CHOL 210 (H) 11/02/2018   HDL 42 11/02/2018   LDLCALC 151 (H) 11/02/2018   TRIG 84 11/02/2018   CHOLHDL 5.0 11/02/2018       In - Office BG  181 MG/Dl   ASSESSMENT / PLAN / RECOMMENDATIONS:   1) ) Type 2 Diabetes Mellitus, Poorly controlled, With retinopathic complications - Most recent A1c of  11.3 %. Goal A1c < 7.0 %.    - Pt did not bring meter today, we discussed the importance of having this data available to me , he noted higher Bg's during the evening . This am his fasting is 181 mg/dL which is an improvement, will adjust his morning dose as below  - A1c improved but still above goal  - We discussed the importance  Of glucose control in improving his diabetic retinopathy - He is intolerant to higher doses of metformin    MEDICATIONS: - Humalog  Mix 26 units With Breakfast and 24 units with Supper  - Continue Victoza  1.2 mg daily  - Continue Metformin 500 mg daily with Breakfast   EDUCATION / INSTRUCTIONS:  BG monitoring instructions: Patient is instructed to check his blood sugars 2 times a day, before breakfast and supper.  Call [facility] if: BG persistently < 70 or > 300. . I reviewed the Rule of 15 for the treatment of hypoglycemia in detail with the patient. Literature supplied.    2) Diabetic complications:   Eye: Does have known diabetic retinopathy.   Neuro/ Feet: Does not have known diabetic peripheral neuropathy .   Renal: Patient does not have known baseline CKD. He   is  on an ACEI/ARB at present.   3) Lipids: Patient stopped using crestor, attributes headaches to it. We discussed the cardiovascular benefits of statins, Pt agreed to trying atorvastatin 20 mg daily      F/U in 3 months    Signed electronically by: Mack Guise, MD  [facility]  [facility] [address] [place], [place] [address] Phone: [phone] FAX: [phone]   CC: Horald Pollen, MD [place] [address] Phone: [phone]  Fax: [phone]  Return to Endocrinology clinic as below: Future Appointments  Date Time Provider [place]  07/19/2019  7:30 AM Aldea Avis, Melanie Crazier, MD [facility] None

## 2019-04-20 ENCOUNTER — Ambulatory Visit (INDEPENDENT_AMBULATORY_CARE_PROVIDER_SITE_OTHER): Payer: 59 | Admitting: Internal Medicine

## 2019-04-20 ENCOUNTER — Encounter: Payer: Self-pay | Admitting: Internal Medicine

## 2019-04-20 ENCOUNTER — Encounter: Payer: Self-pay | Admitting: *Deleted

## 2019-04-20 ENCOUNTER — Other Ambulatory Visit: Payer: Self-pay

## 2019-04-20 VITALS — BP 142/90 | HR 104 | Temp 98.3°F | Ht 69.0 in | Wt 228.0 lb

## 2019-04-20 DIAGNOSIS — E1165 Type 2 diabetes mellitus with hyperglycemia: Secondary | ICD-10-CM | POA: Diagnosis not present

## 2019-04-20 DIAGNOSIS — Z794 Long term (current) use of insulin: Secondary | ICD-10-CM | POA: Diagnosis not present

## 2019-04-20 DIAGNOSIS — E113413 Type 2 diabetes mellitus with severe nonproliferative diabetic retinopathy with macular edema, bilateral: Secondary | ICD-10-CM | POA: Insufficient documentation

## 2019-04-20 DIAGNOSIS — E785 Hyperlipidemia, unspecified: Secondary | ICD-10-CM | POA: Diagnosis not present

## 2019-04-20 LAB — POCT GLYCOSYLATED HEMOGLOBIN (HGB A1C): Hemoglobin A1C: 11.3 % — AB (ref 4.0–5.6)

## 2019-04-20 LAB — GLUCOSE, POCT (MANUAL RESULT ENTRY): POC Glucose: 181 mg/dl — AB (ref 70–99)

## 2019-04-20 MED ORDER — METFORMIN HCL 500 MG PO TABS: 500.0000 mg | ORAL_TABLET | Freq: Every day | ORAL | 3 refills | Status: DC

## 2019-04-20 MED ORDER — ATORVASTATIN CALCIUM 20 MG PO TABS: 20.0000 mg | ORAL_TABLET | Freq: Every day | ORAL | 3 refills | Status: DC

## 2019-04-20 MED ORDER — INSULIN LISPRO PROT & LISPRO (75-25 MIX) 100 UNIT/ML KWIKPEN: PEN_INJECTOR | SUBCUTANEOUS | 3 refills | Status: DC

## 2019-04-20 NOTE — Patient Instructions (Addendum)
-   Humalog  Mix 26 units With Breakfast and 24 units with Supper  - Continue Victoza 1.2 mg daily  - Continue Metformin 500 mg daily with Breakfast  - Stop Crestor  - Start Atorvastatin ( Lipitor) 20 mg at bedtime ( for cholesterol)   If your sugars are consistently over 200, please contact us     HOW TO TREAT LOW BLOOD SUGARS (Blood sugar LESS THAN 70 MG/DL)  Please follow the RULE OF 15 for the treatment of hypoglycemia treatment (when your (blood sugars are less than 70 mg/dL)    STEP 1: Take 15 grams of carbohydrates when your blood sugar is low, which includes:   3-4 GLUCOSE TABS  OR  3-4 OZ OF JUICE OR REGULAR SODA OR  ONE TUBE OF GLUCOSE GEL     STEP 2: RECHECK blood sugar in 15 MINUTES STEP 3: If your blood sugar is still low at the 15 minute recheck --> then, go back to STEP 1 and treat AGAIN with another 15 grams of carbohydrates.

## 2019-06-03 ENCOUNTER — Other Ambulatory Visit: Payer: Self-pay

## 2019-06-03 ENCOUNTER — Encounter (HOSPITAL_COMMUNITY): Payer: Self-pay | Admitting: Emergency Medicine

## 2019-06-03 ENCOUNTER — Emergency Department (HOSPITAL_COMMUNITY)
Admission: EM | Admit: 2019-06-03 | Discharge: 2019-06-03 | Disposition: A | Discharge status: Home / Self Care | Payer: 59 | Attending: Emergency Medicine | Admitting: Emergency Medicine

## 2019-06-03 DIAGNOSIS — Z113 Encounter for screening for infections with a predominantly sexual mode of transmission: Secondary | ICD-10-CM | POA: Diagnosis present

## 2019-06-03 DIAGNOSIS — Z794 Long term (current) use of insulin: Secondary | ICD-10-CM | POA: Insufficient documentation

## 2019-06-03 DIAGNOSIS — Z79899 Other long term (current) drug therapy: Secondary | ICD-10-CM | POA: Insufficient documentation

## 2019-06-03 DIAGNOSIS — E113413 Type 2 diabetes mellitus with severe nonproliferative diabetic retinopathy with macular edema, bilateral: Secondary | ICD-10-CM | POA: Diagnosis not present

## 2019-06-03 DIAGNOSIS — L739 Follicular disorder, unspecified: Secondary | ICD-10-CM

## 2019-06-03 LAB — HIV ANTIBODY (ROUTINE TESTING W REFLEX): HIV Screen 4th Generation wRfx: NONREACTIVE

## 2019-06-03 LAB — RPR: RPR Ser Ql: NONREACTIVE

## 2019-06-03 MED ORDER — DOXYCYCLINE HYCLATE 100 MG PO CAPS: 100.0000 mg | ORAL_CAPSULE | Freq: Two times a day (BID) | ORAL | 0 refills | Status: DC

## 2019-06-03 NOTE — ED Provider Notes (Signed)
[facility]-EMERGENCY DEPT Provider Note   CSN: [id number] Arrival date & time: 06/03/19  0215     History Chief Complaint  Patient presents with  . SEXUALLY TRANSMITTED DISEASE    Aaron D Whetstine is a 34 y.o. male.  Patient presents to the emergency department with concerns of a rash in the groin area.  Symptoms present for 2 days.  Patient reports that he initially thought that it was an infected hair he did squeeze on the sites but nothing really came out.  He has noticed multiple areas now.        Past Medical History:  Diagnosis Date  . Diabetes mellitus     Patient Active Problem List   Diagnosis Date Noted  . 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 Dyslipidemia 01/04/2019  . Dyslipidemia associated with type 2 diabetes mellitus (HCC) 11/30/2018  . Uncontrolled type 2 diabetes mellitus with hyperglycemia (HCC) 11/02/2018  . Chronic pain of both knees 11/02/2018    History reviewed. No pertinent surgical history.     Family History  Problem Relation Age of Onset  . Diabetes Mother     Social History   Tobacco Use  . Smoking status: Never Smoker  . Smokeless tobacco: Never Used  Substance Use Topics  . Alcohol use: No  . Drug use: No    Home Medications Prior to Admission medications   Medication Sig Start Date End Date Taking? Authorizing Provider  atorvastatin (LIPITOR) 20 MG tablet Take 1 tablet (20 mg total) by mouth daily. 04/20/19   Shamleffer, Ibtehal Jaralla, MD  blood glucose meter kit and supplies by Other route as directed. Dispense based on patient and insurance preference. Use up to four times daily as directed. (FOR ICD-10 E10.9, E11.9).    [provider]  doxycycline (VIBRAMYCIN) 100 MG capsule Take 1 capsule (100 mg total) by mouth 2 (two) times daily. 06/03/19   Pollina, Christopher J,  MD  Insulin Lispro Prot & Lispro (HUMALOG MIX 75/25 KWIKPEN) (75-25) 100 UNIT/ML Kwikpen Inject 26 Units into the skin daily with breakfast AND 24 Units daily with supper. 04/20/19   Shamleffer, Ibtehal Jaralla, MD  Insulin Pen Needle 31G X 5 MM MISC 1 Device by Does not apply route 2 (two) times daily. 01/04/19   Shamleffer, Ibtehal Jaralla, MD  ketoconazole (NIZORAL) 2 % shampoo Apply 1 application topically 2 (two) times a week.    [provider]  liraglutide (VICTOZA) 18 MG/3ML SOPN Inject 0.1 mLs (0.6 mg total) into the skin daily. Increase to 1.2mg daily after first week. Patient taking differently: Inject 1.2 mg into the skin daily.  11/02/18   Sagardia, Miguel Jose, MD  metFORMIN (GLUCOPHAGE) 500 MG tablet Take 1 tablet (500 mg total) by mouth daily with breakfast. 04/20/19   Shamleffer, Ibtehal Jaralla, MD    Allergies    Patient has no known allergies.  Review of Systems   Review of Systems  Genitourinary: Negative for discharge, dysuria, penile pain, scrotal swelling and testicular pain.  Skin: Positive for wound.  All other systems reviewed and are negative.   Physical Exam Updated Vital Signs BP (!) 146/107 (BP Location: Right Arm)   Pulse 92   Temp 98.7 F (37.1 C) (Oral)   Resp 16   Ht 5' 9" (1.753 m)   Wt 99.3 kg   SpO2 100%   BMI 32.34 kg/m     Physical Exam Vitals and nursing note reviewed. Exam conducted with a chaperone present.  Constitutional:      Appearance: Normal appearance.  HENT:     Head: Normocephalic and atraumatic.  Pulmonary:     Effort: Pulmonary effort is normal.  Genitourinary:    Testes: Normal.     Comments: Multiple deep ulcerations in the area of the mons pubis, inguinal region and one on the base of the penis.  No associated erythema.  No induration or fluctuance. No scabbing, no vesicles  Skin:    Findings: Rash present.  Neurological:     Mental Status: He is alert and oriented to person, place, and time.     Cranial  Nerves: Cranial nerves are intact.     Sensory: Sensation is intact.     Motor: Motor function is intact.     ED Results / Procedures / Treatments   Labs (all labs ordered are listed, but only abnormal results are displayed) Labs Reviewed  RPR  HIV ANTIBODY (ROUTINE TESTING W REFLEX)  GC/CHLAMYDIA PROBE AMP () NOT AT [facility]    EKG None  Radiology No results found.  Procedures Procedures (including critical care time)  Medications Ordered in ED Medications - No data to display  ED Course  I have reviewed the triage vital signs and the nursing notes.  Pertinent labs & imaging results that were available during my care of the patient were reviewed by me and considered in my medical decision making (see chart for details).    MDM Rules/Calculators/A&P                      Rash of unclear etiology.  Patient has used Nair in the region, possible reaction to the chemical.  He reports that initially the sites were swollen and therefore possible folliculitis.  Lesions are now ulcerated but he has been squeezing on some of the sites, possibly healing folliculitis.  Areas do not appear to be particularly tender, consider herpes and syphilis.  Will send herpes PCR and RPR.  Final Clinical Impression(s) / ED Diagnoses Final diagnoses:  Folliculitis    Rx / DC Orders ED Discharge Orders         Ordered    doxycycline (VIBRAMYCIN) 100 MG capsule  2 times daily     06/03/19 0301           Pollina, Christopher J, MD 06/03/19 0301  

## 2019-06-03 NOTE — ED Triage Notes (Signed)
Patient has lesion on the base of his penis.

## 2019-06-09 ENCOUNTER — Other Ambulatory Visit: Payer: Self-pay

## 2019-06-09 ENCOUNTER — Other Ambulatory Visit (HOSPITAL_COMMUNITY)
Admission: RE | Admit: 2019-06-09 | Discharge: 2019-06-09 | Disposition: A | Discharge status: Home / Self Care | Payer: 59 | Source: Ambulatory Visit | Attending: Emergency Medicine | Admitting: Emergency Medicine

## 2019-06-09 ENCOUNTER — Encounter: Payer: Self-pay | Admitting: Emergency Medicine

## 2019-06-09 ENCOUNTER — Ambulatory Visit: Payer: 59 | Admitting: Emergency Medicine

## 2019-06-09 VITALS — BP 132/88 | HR 89 | Temp 98.3°F | Resp 16 | Ht 69.0 in | Wt 228.0 lb

## 2019-06-09 DIAGNOSIS — L089 Local infection of the skin and subcutaneous tissue, unspecified: Secondary | ICD-10-CM | POA: Insufficient documentation

## 2019-06-09 DIAGNOSIS — L739 Follicular disorder, unspecified: Secondary | ICD-10-CM

## 2019-06-09 NOTE — Patient Instructions (Addendum)
   If you have lab work done today you will be contacted with your lab results within the next 2 weeks.  If you have not heard from us then please contact us. The fastest way to get your results is to register for My Chart.   IF you received an x-ray today, you will receive an invoice from [facility]. Please contact [facility] at [phone] with questions or concerns regarding your invoice.   IF you received labwork today, you will receive an invoice from [facility]. Please contact [facility] at [phone] with questions or concerns regarding your invoice.   Our billing staff will not be able to assist you with questions regarding bills from these companies.  You will be contacted with the lab results as soon as they are available. The fastest way to get your results is to activate your My Chart account. Instructions are located on the last page of this paperwork. If you have not heard from us regarding the results in 2 weeks, please contact this office.      Health Maintenance, Male Adopting a healthy lifestyle and getting preventive care are important in promoting health and wellness. Ask your health care provider about:  The right schedule for you to have regular tests and exams.  Things you can do on your own to prevent diseases and keep yourself healthy. What should I know about diet, weight, and exercise? Eat a healthy diet   Eat a diet that includes plenty of vegetables, fruits, low-fat dairy products, and lean protein.  Do not eat a lot of foods that are high in solid fats, added sugars, or sodium. Maintain a healthy weight Body mass index (BMI) is a measurement that can be used to identify possible weight problems. It estimates body fat based on height and weight. Your health care provider can help determine your BMI and help you achieve or maintain a healthy weight. Get regular exercise Get regular exercise. This is one of the most important things you  can do for your health. Most adults should:  Exercise for at least 150 minutes each week. The exercise should increase your heart rate and make you sweat (moderate-intensity exercise).  Do strengthening exercises at least twice a week. This is in addition to the moderate-intensity exercise.  Spend less time sitting. Even light physical activity can be beneficial. Watch cholesterol and blood lipids Have your blood tested for lipids and cholesterol at 36 years of age, then have this test every 5 years. You may need to have your cholesterol levels checked more often if:  Your lipid or cholesterol levels are high.  You are older than 36 years of age.  You are at high risk for heart disease. What should I know about cancer screening? Many types of cancers can be detected early and may often be prevented. Depending on your health history and family history, you may need to have cancer screening at various ages. This may include screening for:  Colorectal cancer.  Prostate cancer.  Skin cancer.  Lung cancer. What should I know about heart disease, diabetes, and high blood pressure? Blood pressure and heart disease  High blood pressure causes heart disease and increases the risk of stroke. This is more likely to develop in people who have high blood pressure readings, are of African descent, or are overweight.  Talk with your health care provider about your target blood pressure readings.  Have your blood pressure checked: ? Every 3-5 years if you are 18-39   years of age. ? Every year if you are 40 years old or older.  If you are between the ages of 65 and 75 and are a current or former smoker, ask your health care provider if you should have a one-time screening for abdominal aortic aneurysm (AAA). Diabetes Have regular diabetes screenings. This checks your fasting blood sugar level. Have the screening done:  Once every three years after age 45 if you are at a normal weight and have  a low risk for diabetes.  More often and at a younger age if you are overweight or have a high risk for diabetes. What should I know about preventing infection? Hepatitis B If you have a higher risk for hepatitis B, you should be screened for this virus. Talk with your health care provider to find out if you are at risk for hepatitis B infection. Hepatitis C Blood testing is recommended for:  Everyone born from 1945 through 1965.  Anyone with known risk factors for hepatitis C. Sexually transmitted infections (STIs)  You should be screened each year for STIs, including gonorrhea and chlamydia, if: ? You are sexually active and are younger than 36 years of age. ? You are older than 36 years of age and your health care provider tells you that you are at risk for this type of infection. ? Your sexual activity has changed since you were last screened, and you are at increased risk for chlamydia or gonorrhea. Ask your health care provider if you are at risk.  Ask your health care provider about whether you are at high risk for HIV. Your health care provider may recommend a prescription medicine to help prevent HIV infection. If you choose to take medicine to prevent HIV, you should first get tested for HIV. You should then be tested every 3 months for as long as you are taking the medicine. Follow these instructions at home: Lifestyle  Do not use any products that contain nicotine or tobacco, such as cigarettes, e-cigarettes, and chewing tobacco. If you need help quitting, ask your health care provider.  Do not use street drugs.  Do not share needles.  Ask your health care provider for help if you need support or information about quitting drugs. Alcohol use  Do not drink alcohol if your health care provider tells you not to drink.  If you drink alcohol: ? Limit how much you have to 0-2 drinks a day. ? Be aware of how much alcohol is in your drink. In the [place] one drink equals one 12  oz bottle of beer (355 mL), one 5 oz glass of wine (148 mL), or one 1 oz glass of hard liquor (44 mL). General instructions  Schedule regular health, dental, and eye exams.  Stay current with your vaccines.  Tell your health care provider if: ? You often feel depressed. ? You have ever been abused or do not feel safe at home. Summary  Adopting a healthy lifestyle and getting preventive care are important in promoting health and wellness.  Follow your health care provider's instructions about healthy diet, exercising, and getting tested or screened for diseases.  Follow your health care provider's instructions on monitoring your cholesterol and blood pressure. This information is not intended to replace advice given to you by your health care provider. Make sure you discuss any questions you have with your health care provider. Document Revised: 03/03/2018 Document Reviewed: 03/03/2018 Elsevier Patient Education  2020 [employer]  

## 2019-06-09 NOTE — Progress Notes (Signed)
Aaron Woods 34 y.o.   Chief Complaint  Patient presents with  . Rash    x 1 week per pt sores groin area with pain and itchin    HISTORY OF PRESENT ILLNESS: This is a 34 y.o. male here for follow-up of genital rash.  Seen recently in the emergency room and started on doxycycline twice a day. STD screening negative except for positive HSV 1 antibody. Patient states he had ingrown hairs in his pubic area and applied Nair solution.  Developed a rash and infection.  Denies dysuria or penile discharge.  Married in a monogamous relationship. Results from the emergency room visit reviewed with patient. Recent Results (from the past 2160 hour(s))  HM DIABETES EYE EXAM     Status: Abnormal   Collection Time: 04/13/19 12:00 AM  Result Value Ref Range   HM Diabetic Eye Exam Retinopathy (A) No Retinopathy    Comment: P. Dunn OD, Severe non prolifer diabetic retinopathy   POCT Glucose (CBG)     Status: Abnormal   Collection Time: 04/20/19  8:28 AM  Result Value Ref Range   POC Glucose 181 (A) 70 - 99 mg/dl  POCT HgB A1C     Status: Abnormal   Collection Time: 04/20/19  8:35 AM  Result Value Ref Range   Hemoglobin A1C 11.3 (A) 4.0 - 5.6 %   HbA1c POC (<> result, manual entry)     HbA1c, POC (prediabetic range)     HbA1c, POC (controlled diabetic range)    Herpes simplex virus (HSV), DNA by PCR Sterile Swab     Status: Abnormal (Preliminary result)   Collection Time: 06/03/19  3:02 AM  Result Value Ref Range   Source of Sample PENDING    HSV 1 DNA Positive (A) Negative   HSV 2 DNA Negative Negative    Comment: (NOTE) This test was developed and its performance characteristics determined by [employer]. It has not been cleared or approved by the U.S. Food and Drug Administration. The FDA has determined that such clearance or approval is not necessary. This test is used for clinical purposes. It should not be regarded as investigational or research. Performed At: [facility] [place], [place] [id number] Rush Farmer MD OI:[id number]   RPR     Status: None   Collection Time: 06/03/19  3:05 AM  Result Value Ref Range   RPR Ser Ql NON REACTIVE NON REACTIVE    Comment: Performed at [facility], [address] [place], [place] [address]  HIV Antibody (routine testing w rflx)     Status: None   Collection Time: 06/03/19  3:05 AM  Result Value Ref Range   HIV Screen 4th Generation wRfx NON REACTIVE NON REACTIVE    Comment: Performed at [facility] [address], [place], [place] [address]     HPI   Prior to Admission medications   Medication Sig Start Date End Date Taking? Authorizing Provider  atorvastatin (LIPITOR) 20 MG tablet Take 1 tablet (20 mg total) by mouth daily. 04/20/19  Yes Shamleffer, Melanie Crazier, MD  doxycycline (VIBRAMYCIN) 100 MG capsule Take 1 capsule (100 mg total) by mouth 2 (two) times daily. 06/03/19  Yes Pollina, Gwenyth Allegra, MD  Insulin Lispro Prot & Lispro (HUMALOG MIX 75/25 KWIKPEN) (75-25) 100 UNIT/ML Kwikpen Inject 26 Units into the skin daily with breakfast AND 24 Units daily with supper. 04/20/19  Yes Shamleffer, Melanie Crazier, MD  liraglutide (VICTOZA) 18 MG/3ML  SOPN Inject 0.1 mLs (0.6 mg total) into the skin daily. Increase to 1.20m daily after first week. Patient taking differently: Inject 1.2 mg into the skin daily.  11/02/18  Yes Avaleen Brownley, MInes Bloomer MD  metFORMIN (GLUCOPHAGE) 500 MG tablet Take 1 tablet (500 mg total) by mouth daily with breakfast. 04/20/19  Yes Shamleffer, IMelanie Crazier MD  blood glucose meter kit and supplies by Other route as directed. Dispense based on patient and insurance preference. Use up to four times daily as directed. (FOR ICD-10 E10.9, E11.9).    [provider]  Insulin Pen Needle 31G X 5 MM MISC 1 Device by Does not apply route 2 (two) times daily. 01/04/19   Shamleffer, IMelanie Crazier MD  ketoconazole (NIZORAL) 2 % shampoo Apply 1  application topically 2 (two) times a week.    [provider]    No Known Allergies  Patient Active Problem List   Diagnosis Date Noted  . 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 Dyslipidemia 01/04/2019  . Dyslipidemia associated with type 2 diabetes mellitus ([place]ells[place] 11/30/2018  . Uncontrolled type 2 diabetes mellitus with hyperglycemia (HKeith 11/02/2018  . Chronic pain of both knees 11/02/2018    Past Medical History:  Diagnosis Date  . Diabetes mellitus     No past surgical history on file.  Social History   Socioeconomic History  . Marital status: Married    Spouse name: Not on file  . Number of children: Not on file  . Years of education: Not on file  . Highest education level: Not on file  Occupational History  . Not on file  Tobacco Use  . Smoking status: Never Smoker  . Smokeless tobacco: Never Used  Substance and Sexual Activity  . Alcohol use: No  . Drug use: No  . Sexual activity: Not on file  Other Topics Concern  . Not on file  Social History Narrative  . Not on file   Social Determinants of Health   Financial Resource Strain:   . Difficulty of Paying Living Expenses:   Food Insecurity:   . Worried About RCharity fundraiserin the Last Year:   . RArboriculturistin the Last Year:   Transportation Needs:   . LFilm/video editor(Medical):   .Marland KitchenLack of Transportation (Non-Medical):   Physical Activity:   . Days of Exercise per Week:   . Minutes of Exercise per Session:   Stress:   . Feeling of Stress :   Social Connections:   . Frequency of Communication with Friends and Family:   . Frequency of Social Gatherings with Friends and Family:   . Attends Religious Services:   . Active Member of Clubs or Organizations:   . Attends CArchivistMeetings:   .Marland KitchenMarital Status:   Intimate Partner Violence:   .  Fear of Current or Ex-Partner:   . Emotionally Abused:   .Marland KitchenPhysically Abused:   . Sexually Abused:     Family History  Problem Relation Age of Onset  . Diabetes Mother      Review of Systems  Constitutional: Negative.  Negative for chills and fever.  HENT: Negative.  Negative for congestion and sore throat.   Respiratory: Negative.  Negative for cough and shortness of breath.   Cardiovascular: Negative.   Gastrointestinal: Negative.  Negative for abdominal pain, diarrhea, nausea and vomiting.  Genitourinary: Negative.  Negative for dysuria, frequency and hematuria.  Musculoskeletal: Negative.  Negative for myalgias and neck pain.  Skin: Positive for rash (Improved).  All other systems reviewed and are negative.    Physical Exam Vitals reviewed.  Constitutional:      Appearance: Normal appearance.  HENT:     Head: Normocephalic.  Eyes:     Extraocular Movements: Extraocular movements intact.     Pupils: Pupils are equal, round, and reactive to light.  Cardiovascular:     Rate and Rhythm: Normal rate.  Pulmonary:     Effort: Pulmonary effort is normal.  Genitourinary:    Pubic Area: Rash (Healing well.  No signs of infection) present.  Musculoskeletal:        General: Normal range of motion.     Cervical back: Normal range of motion.  Skin:    General: Skin is warm.     Capillary Refill: Capillary refill takes less than 2 seconds.  Neurological:     General: No focal deficit present.     Mental Status: He is alert and oriented to person, place, and time.  Psychiatric:        Mood and Affect: Mood normal.        Behavior: Behavior normal.      ASSESSMENT & PLAN: Horacio was seen today for rash.  Diagnoses and all orders for this visit:  Skin infection Comments: Improving Orders: -     GC/Chlamydia probe amp ([place])not at [facility]  Folliculitis Comments: Improving    Patient Instructions       If you have lab work done today you will be  contacted with your lab results within the next 2 weeks.  If you have not heard from [place] then please contact us. The fastest way to get your results is to register for My Chart.   IF you received an x-ray today, you will receive an invoice from [facility]. Please contact [facility] at [phone] with questions or concerns regarding your invoice.   IF you received labwork today, you will receive an invoice from [facility]. Please contact [facility] at [phone] with questions or concerns regarding your invoice.   Our billing staff will not be able to assist you with questions regarding bills from these companies.  You will be contacted with the lab results as soon as they are available. The fastest way to get your results is to activate your My Chart account. Instructions are located on the last page of this paperwork. If you have not heard from [place] regarding the results in 2 weeks, please contact this office.     Health Maintenance, Male Adopting a healthy lifestyle and getting preventive care are important in promoting health and wellness. Ask your health care provider about:  The right schedule for you to have regular tests and exams.  Things you can do on your own to prevent diseases and keep yourself healthy. What should I know about diet, weight, and exercise? Eat a healthy diet   Eat a diet that includes plenty of vegetables, fruits, low-fat dairy products, and lean protein.  Do not eat a lot of foods that are high in solid fats, added sugars, or sodium. Maintain a healthy weight Body mass index (BMI) is a measurement that can be used to identify possible weight problems. It estimates body fat based on height and weight. Your health care provider can help determine your BMI and help you achieve or maintain a healthy weight. Get regular  exercise Get regular exercise. This is one of the most important things you can do for your health. Most adults  should:  Exercise for at least 150 minutes each week. The exercise should increase your heart rate and make you sweat (moderate-intensity exercise).  Do strengthening exercises at least twice a week. This is in addition to the moderate-intensity exercise.  Spend less time sitting. Even light physical activity can be beneficial. Watch cholesterol and blood lipids Have your blood tested for lipids and cholesterol at 36 years of age, then have this test every 5 years. You may need to have your cholesterol levels checked more often if:  Your lipid or cholesterol levels are high.  You are older than 36 years of age.  You are at high risk for heart disease. What should I know about cancer screening? Many types of cancers can be detected early and may often be prevented. Depending on your health history and family history, you may need to have cancer screening at various ages. This may include screening for:  Colorectal cancer.  Prostate cancer.  Skin cancer.  Lung cancer. What should I know about heart disease, diabetes, and high blood pressure? Blood pressure and heart disease  High blood pressure causes heart disease and increases the risk of stroke. This is more likely to develop in people who have high blood pressure readings, are of African descent, or are overweight.  Talk with your health care provider about your target blood pressure readings.  Have your blood pressure checked: ? Every 3-5 years if you are 47-55 years of age. ? Every year if you are 43 years old or older.  If you are between the ages of 60 and 4 and are a current or former smoker, ask your health care provider if you should have a one-time screening for abdominal aortic aneurysm (AAA). Diabetes Have regular diabetes screenings. This checks your fasting blood sugar level. Have the screening done:  Once every three years after age 27 if you are at a normal weight and have a low risk for diabetes.  More  often and at a younger age if you are overweight or have a high risk for diabetes. What should I know about preventing infection? Hepatitis B If you have a higher risk for hepatitis B, you should be screened for this virus. Talk with your health care provider to find out if you are at risk for hepatitis B infection. Hepatitis C Blood testing is recommended for:  Everyone born from [birth year] through 1967.  Anyone with known risk factors for hepatitis C. Sexually transmitted infections (STIs)  You should be screened each year for STIs, including gonorrhea and chlamydia, if: ? You are sexually active and are younger than 36 years of age. ? You are older than 36 years of age and your health care provider tells you that you are at risk for this type of infection. ? Your sexual activity has changed since you were last screened, and you are at increased risk for chlamydia or gonorrhea. Ask your health care provider if you are at risk.  Ask your health care provider about whether you are at high risk for HIV. Your health care provider may recommend a prescription medicine to help prevent HIV infection. If you choose to take medicine to prevent HIV, you should first get tested for HIV. You should then be tested every 3 months for as long as you are taking the medicine. Follow these instructions at home: Lifestyle  Do  not use any products that contain nicotine or tobacco, such as cigarettes, e-cigarettes, and chewing tobacco. If you need help quitting, ask your health care provider.  Do not use street drugs.  Do not share needles.  Ask your health care provider for help if you need support or information about quitting drugs. Alcohol use  Do not drink alcohol if your health care provider tells you not to drink.  If you drink alcohol: ? Limit how much you have to 0-2 drinks a day. ? Be aware of how much alcohol is in your drink. In the [place] one drink equals one 12 oz bottle of beer (355 mL), one 5  oz glass of wine (148 mL), or one 1 oz glass of hard liquor (44 mL). General instructions  Schedule regular health, dental, and eye exams.  Stay current with your vaccines.  Tell your health care provider if: ? You often feel depressed. ? You have ever been abused or do not feel safe at home. Summary  Adopting a healthy lifestyle and getting preventive care are important in promoting health and wellness.  Follow your health care provider's instructions about healthy diet, exercising, and getting tested or screened for diseases.  Follow your health care provider's instructions on monitoring your cholesterol and blood pressure. This information is not intended to replace advice given to you by your health care provider. Make sure you discuss any questions you have with your health care provider. Document Revised: 03/03/2018 Document Reviewed: 03/03/2018 Elsevier Patient Education  2020 Elsevier Inc.      Agustina Caroli, MD [facility]

## 2019-06-10 LAB — GC/CHLAMYDIA PROBE AMP (~~LOC~~) NOT AT ARMC
Chlamydia: NEGATIVE
Comment: NEGATIVE
Comment: NORMAL
Neisseria Gonorrhea: NEGATIVE

## 2019-06-13 LAB — HSV DNA BY PCR (REFERENCE LAB)
HSV 1 DNA: POSITIVE — AB
HSV 2 DNA: NEGATIVE

## 2019-07-15 ENCOUNTER — Other Ambulatory Visit: Payer: Self-pay

## 2019-07-19 ENCOUNTER — Ambulatory Visit (INDEPENDENT_AMBULATORY_CARE_PROVIDER_SITE_OTHER): Payer: 59 | Admitting: Internal Medicine

## 2019-07-19 ENCOUNTER — Other Ambulatory Visit: Payer: Self-pay

## 2019-07-19 ENCOUNTER — Encounter: Payer: Self-pay | Admitting: Internal Medicine

## 2019-07-19 VITALS — BP 124/88 | HR 88 | Temp 98.7°F | Ht 69.0 in | Wt 233.6 lb

## 2019-07-19 DIAGNOSIS — E1165 Type 2 diabetes mellitus with hyperglycemia: Secondary | ICD-10-CM | POA: Diagnosis not present

## 2019-07-19 DIAGNOSIS — Z794 Long term (current) use of insulin: Secondary | ICD-10-CM

## 2019-07-19 DIAGNOSIS — E113413 Type 2 diabetes mellitus with severe nonproliferative diabetic retinopathy with macular edema, bilateral: Secondary | ICD-10-CM

## 2019-07-19 DIAGNOSIS — E785 Hyperlipidemia, unspecified: Secondary | ICD-10-CM | POA: Diagnosis not present

## 2019-07-19 LAB — POCT GLYCOSYLATED HEMOGLOBIN (HGB A1C): Hemoglobin A1C: 11 % — AB (ref 4.0–5.6)

## 2019-07-19 MED ORDER — INSULIN LISPRO PROT & LISPRO (75-25 MIX) 100 UNIT/ML KWIKPEN: 26.0000 [IU] | PEN_INJECTOR | Freq: Two times a day (BID) | SUBCUTANEOUS | 3 refills | Status: DC

## 2019-07-19 NOTE — Patient Instructions (Addendum)
-   Humalog  Mix 26 units With Breakfast and 26 units with Supper  - Continue Victoza 1.2 mg daily  - Restart Atorvastatin ( Lipitor) 20 mg at bedtime ( for cholesterol)      HOW TO TREAT LOW BLOOD SUGARS (Blood sugar LESS THAN 70 MG/DL)  Please follow the RULE OF 15 for the treatment of hypoglycemia treatment (when your (blood sugars are less than 70 mg/dL)    STEP 1: Take 15 grams of carbohydrates when your blood sugar is low, which includes:   3-4 GLUCOSE TABS  OR  3-4 OZ OF JUICE OR REGULAR SODA OR  ONE TUBE OF GLUCOSE GEL     STEP 2: RECHECK blood sugar in 15 MINUTES STEP 3: If your blood sugar is still low at the 15 minute recheck --> then, go back to STEP 1 and treat AGAIN with another 15 grams of carbohydrates.

## 2019-07-19 NOTE — Progress Notes (Signed)
Name: Aaron Woods  Age/ Sex: 34 y.o., male   MRN/ DOB: [id number], 1985-05-24     PCP: Horald Pollen, MD   Reason for Endocrinology Evaluation: Type 2 Diabetes Mellitus  Initial Endocrine Consultative Visit: 01/04/2019    PATIENT IDENTIFIER: Aaron Woods is a 34 y.o. male with a past medical history of T2DM and Dyslipidemia. The patient has followed with Endocrinology clinic since 01/04/2019 for consultative assistance with management of his diabetes.  DIABETIC HISTORY:  Aaron Woods was diagnosed with T2DM in 2006. Pt intolerant to BID dosing of Metformin. His hemoglobin A1c has ranged from 7.0 % , peaking at 13.4% in 2020.  On his initial visit to our clinic his A1c was 13.4%. He was on Levemir, victoza and Metformin. We replaced Levemir with Novolog Mix. He stopped Metformin by 06/2019 due to intolerance issues    Lives with wife , 3 kids (12,10,8)   Works in park enforcement   SUBJECTIVE:   During the last visit (04/19/2018): A1c 11.3%. Continued Victoza, Metformin and humalog mix.   Today (07/19/2019): Aaron Woods  is here for a follow up on diabetes management.   He checks his blood sugars 1  times daily, preprandial to breakfast and bedtime . The patient has not had hypoglycemic episodes .  Otherwise, the patient has not required any recent emergency interventions for hypoglycemia and has not had recent hospitalizations secondary to hyper or hypoglycemic episodes.    ROS: As per HPI and as detailed below: Review of Systems  Eyes: Negative for blurred vision and pain.  Respiratory: Negative for cough and shortness of breath.   Gastrointestinal: Negative for diarrhea and nausea.      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 Idolina Primer)  Denies:  neuropathy and CKD   Last eye exam: Completed 04/14/2019  Macrovascular complications:    Denies: CAD, PVD, CVA    HISTORY:  Past Medical History:  Past Medical History:  Diagnosis Date  . Diabetes mellitus     Past Surgical History: No past surgical history on file.  Social History:  reports that he has never smoked. He has never used smokeless tobacco. He reports that he does not drink alcohol or use drugs. Family History:  Family History  Problem Relation Age of Onset  . Diabetes Mother      HOME MEDICATIONS: Allergies as of 07/19/2019   No Known Allergies     Medication List       Accurate as of July 19, 2019  7:53 AM. If you have any questions, ask your nurse or doctor.        STOP taking these medications   doxycycline 100 MG capsule Commonly known as: VIBRAMYCIN Stopped by: Dorita Sciara, MD     TAKE these medications   atorvastatin 20 MG tablet Commonly known as: LIPITOR Take 1 tablet (20 mg total) by mouth daily.   blood glucose meter kit and supplies by Other route as directed. Dispense based on patient and insurance preference. Use up to four times daily as directed. (FOR ICD-10 E10.9,  E11.9).   Insulin Lispro Prot & Lispro (75-25) 100 UNIT/ML Kwikpen Commonly known as: HumaLOG Mix 75/25 KwikPen Inject 26 Units into the skin 2 (two) times daily with a meal. What changed: See the new instructions. Changed by: Dorita Sciara, MD   Insulin Pen Needle 31G X 5 MM Misc 1 Device by Does not apply route 2 (two) times daily.   ketoconazole 2 % shampoo Commonly known as: NIZORAL Apply 1 application topically 2 (two) times a week.   liraglutide 18 MG/3ML Sopn Commonly known as: VICTOZA Inject 0.1 mLs (0.6 mg total) into the skin daily. Increase to 1.'2mg'$  daily  after first week. What changed:   how much to take  additional instructions   metFORMIN 500 MG tablet Commonly known as: GLUCOPHAGE Take 1 tablet (500 mg total) by mouth daily with breakfast.        OBJECTIVE:   Vital Signs: BP 124/88 (BP Location: Left Arm, Patient Position: Sitting, Cuff Size: Large)   Pulse 88   Temp 98.7 F (37.1 C)   Ht '5\' 9"'$  (1.753 m)   Wt 233 lb 9.6 oz (106 kg)   SpO2 98%   BMI 34.50 kg/m   Wt Readings from Last 3 Encounters:  07/19/19 233 lb 9.6 oz (106 kg)  06/09/19 228 lb (103.4 kg)  06/03/19 219 lb (99.3 kg)     Exam: General: Pt appears well and is in NAD  Lungs: Clear with good BS bilat with no rales, rhonchi, or wheezes  Heart: RRR with normal S1 and S2 and no gallops; no murmurs; no rub  Abdomen:  soft, nontender, without masses or organomegaly palpable  Extremities: Trace pretibial edema.   Neuro: MS is good with appropriate affect, pt is alert and Ox3    DM foot exam: 04/20/2019  The skin of the feet is intact without sores or ulcerations. The pedal pulses are 2+ on right and 2+ on left. The sensation is intact to a screening 5.07, 10 gram monofilament bilaterally       DATA REVIEWED:  Lab Results  Component Value Date   HGBA1C 11.0 (A) 07/19/2019   HGBA1C 11.3 (A) 04/20/2019   HGBA1C 13.4 (A) 11/30/2018   Lab Results  Component Value Date   MICROALBUR 0.50 04/11/2009   LDLCALC 151 (H) 11/02/2018   CREATININE 0.71 (L) 11/02/2018    Lab Results  Component Value Date   CHOL 210 (H) 11/02/2018   HDL 42 11/02/2018   LDLCALC 151 (H) 11/02/2018   TRIG 84 11/02/2018   CHOLHDL 5.0 11/02/2018         ASSESSMENT / PLAN / RECOMMENDATIONS:   1) ) Type 2 Diabetes Mellitus, Poorly controlled, With retinopathic complications - Most recent A1c of 11.0 %. Goal A1c < 7.0 %.    - Poorly controlled diabetes due to medication non-adherence, pt admits to missing doses of insulin but he is under estimating the amount of misses  doses, his fasting BG's range from 94 to > 200 mg/dL, this is due to either missed doses , excessive oral intake or both.  - Unfortunately the role of endocrinology is limited when there are non-compliance issues, all I can do is council him again about the effects of hyperglycemia and end organ damage.   - We discussed add-on therapy with SGLT-2 inhibitors but the pt declined at this time  - He is intolerant to metformin    MEDICATIONS: - Continue Humalog  Mix 26 units With Breakfast and  with Supper  -  Continue Victoza 1.2 mg daily    EDUCATION / INSTRUCTIONS:  BG monitoring instructions: Patient is instructed to check his blood sugars 2 times a day, before breakfast and supper.  Call [facility] if: BG persistently < 70 or > 300. . I reviewed the Rule of 15 for the treatment of hypoglycemia in detail with the patient. Literature supplied.    2) Diabetic complications:   Eye: Does have known diabetic retinopathy.   Neuro/ Feet: Does not have known diabetic peripheral neuropathy .   Renal: Patient does not have known baseline CKD. He   is  on an ACEI/ARB at present.   3) Dyslipidemia: We switched crestor to lipitor due to c/o headaches with crestor  but today he tells me he doesn't take Lipitor either . Pt attributes headaches to metformin as well., We again discussed the cardiovascular benefits of statins, I have explained to him that he may have an underlying headache issue that he may need to address with his PCP . - I have encouraged him to restart lipitor     F/U in 4 months    Signed electronically by: Mack Guise, MD  [facility]  [facility] [address] [place], [place] [address] Phone: [phone] FAX: [phone]   CC: Horald Pollen, MD [place] [address] Phone: [phone]  Fax: [phone]  Return to Endocrinology clinic as below: No future appointments.

## 2019-12-01 ENCOUNTER — Inpatient Hospital Stay (HOSPITAL_COMMUNITY)
Admission: EM | Admit: 2019-12-01 | Discharge: 2019-12-08 | DRG: 177 | Disposition: A | Discharge status: Home / Self Care | Payer: 59 | Attending: Internal Medicine | Admitting: Internal Medicine

## 2019-12-01 ENCOUNTER — Emergency Department (HOSPITAL_COMMUNITY): Payer: 59

## 2019-12-01 ENCOUNTER — Other Ambulatory Visit: Payer: Self-pay

## 2019-12-01 ENCOUNTER — Encounter (HOSPITAL_COMMUNITY): Payer: Self-pay | Admitting: Emergency Medicine

## 2019-12-01 DIAGNOSIS — J9601 Acute respiratory failure with hypoxia: Secondary | ICD-10-CM | POA: Diagnosis present

## 2019-12-01 DIAGNOSIS — A419 Sepsis, unspecified organism: Secondary | ICD-10-CM | POA: Diagnosis not present

## 2019-12-01 DIAGNOSIS — Z79899 Other long term (current) drug therapy: Secondary | ICD-10-CM | POA: Diagnosis not present

## 2019-12-01 DIAGNOSIS — T380X5A Adverse effect of glucocorticoids and synthetic analogues, initial encounter: Secondary | ICD-10-CM | POA: Diagnosis present

## 2019-12-01 DIAGNOSIS — R7989 Other specified abnormal findings of blood chemistry: Secondary | ICD-10-CM | POA: Diagnosis not present

## 2019-12-01 DIAGNOSIS — E669 Obesity, unspecified: Secondary | ICD-10-CM | POA: Diagnosis present

## 2019-12-01 DIAGNOSIS — R6520 Severe sepsis without septic shock: Secondary | ICD-10-CM

## 2019-12-01 DIAGNOSIS — D509 Iron deficiency anemia, unspecified: Secondary | ICD-10-CM | POA: Diagnosis present

## 2019-12-01 DIAGNOSIS — U071 COVID-19: Secondary | ICD-10-CM | POA: Diagnosis present

## 2019-12-01 DIAGNOSIS — E785 Hyperlipidemia, unspecified: Secondary | ICD-10-CM | POA: Diagnosis present

## 2019-12-01 DIAGNOSIS — L309 Dermatitis, unspecified: Secondary | ICD-10-CM | POA: Diagnosis present

## 2019-12-01 DIAGNOSIS — J1282 Pneumonia due to coronavirus disease 2019: Secondary | ICD-10-CM | POA: Diagnosis present

## 2019-12-01 DIAGNOSIS — N179 Acute kidney failure, unspecified: Secondary | ICD-10-CM | POA: Diagnosis present

## 2019-12-01 DIAGNOSIS — Z794 Long term (current) use of insulin: Secondary | ICD-10-CM | POA: Diagnosis not present

## 2019-12-01 DIAGNOSIS — E1165 Type 2 diabetes mellitus with hyperglycemia: Secondary | ICD-10-CM | POA: Diagnosis present

## 2019-12-01 DIAGNOSIS — E66811 Obesity, class 1: Secondary | ICD-10-CM | POA: Diagnosis present

## 2019-12-01 DIAGNOSIS — Z6834 Body mass index (BMI) 34.0-34.9, adult: Secondary | ICD-10-CM

## 2019-12-01 LAB — COMPREHENSIVE METABOLIC PANEL
ALT: 60 U/L — ABNORMAL HIGH (ref 0–44)
AST: 67 U/L — ABNORMAL HIGH (ref 15–41)
Albumin: 3.1 g/dL — ABNORMAL LOW (ref 3.5–5.0)
Alkaline Phosphatase: 82 U/L (ref 38–126)
Anion gap: 9 (ref 5–15)
BUN: 20 mg/dL (ref 6–20)
CO2: 26 mmol/L (ref 22–32)
Calcium: 7.9 mg/dL — ABNORMAL LOW (ref 8.9–10.3)
Chloride: 97 mmol/L — ABNORMAL LOW (ref 98–111)
Creatinine, Ser: 1.23 mg/dL (ref 0.61–1.24)
GFR calc Af Amer: >60 mL/min (ref >60)
GFR calc non Af Amer: >60 mL/min (ref >60)
Glucose, Bld: 357 mg/dL — ABNORMAL HIGH (ref 70–99)
Potassium: 4 mmol/L (ref 3.5–5.1)
Sodium: 132 mmol/L — ABNORMAL LOW (ref 135–145)
Total Bilirubin: 0.3 mg/dL (ref 0.3–1.2)
Total Protein: 6.8 g/dL (ref 6.5–8.1)

## 2019-12-01 LAB — TRIGLYCERIDES: Triglycerides: 81 mg/dL (ref <150)

## 2019-12-01 LAB — CBC WITH DIFFERENTIAL/PLATELET
Abs Immature Granulocytes: 0.01 10*3/uL (ref 0.00–0.07)
Basophils Absolute: 0 10*3/uL (ref 0.0–0.1)
Basophils Relative: 0 %
Eosinophils Absolute: 0 10*3/uL (ref 0.0–0.5)
Eosinophils Relative: 0 %
HCT: 38.9 % — ABNORMAL LOW (ref 39.0–52.0)
Hemoglobin: 12.2 g/dL — ABNORMAL LOW (ref 13.0–17.0)
Immature Granulocytes: 0 %
Lymphocytes Relative: 22 %
Lymphs Abs: 1.2 10*3/uL (ref 0.7–4.0)
MCH: 24.4 pg — ABNORMAL LOW (ref 26.0–34.0)
MCHC: 31.4 g/dL (ref 30.0–36.0)
MCV: 77.8 fL — ABNORMAL LOW (ref 80.0–100.0)
Monocytes Absolute: 0.2 10*3/uL (ref 0.1–1.0)
Monocytes Relative: 3 %
Neutro Abs: 3.9 10*3/uL (ref 1.7–7.7)
Neutrophils Relative %: 75 %
Platelets: 272 10*3/uL (ref 150–400)
RBC: 5 MIL/uL (ref 4.22–5.81)
RDW: 13.9 % (ref 11.5–15.5)
WBC: 5.2 10*3/uL (ref 4.0–10.5)
nRBC: 0 % (ref 0.0–0.2)

## 2019-12-01 LAB — GLUCOSE, CAPILLARY
Glucose-Capillary: 370 mg/dL — ABNORMAL HIGH (ref 70–99)
Glucose-Capillary: 426 mg/dL — ABNORMAL HIGH (ref 70–99)

## 2019-12-01 LAB — FIBRINOGEN: Fibrinogen: 553 mg/dL — ABNORMAL HIGH (ref 210–475)

## 2019-12-01 LAB — C-REACTIVE PROTEIN: CRP: 19.5 mg/dL — ABNORMAL HIGH (ref <1.0)

## 2019-12-01 LAB — LACTATE DEHYDROGENASE: LDH: 492 U/L — ABNORMAL HIGH (ref 98–192)

## 2019-12-01 LAB — CBG MONITORING, ED: Glucose-Capillary: 344 mg/dL — ABNORMAL HIGH (ref 70–99)

## 2019-12-01 LAB — PROCALCITONIN: Procalcitonin: 0.17 ng/mL

## 2019-12-01 LAB — FERRITIN: Ferritin: 825 ng/mL — ABNORMAL HIGH (ref 24–336)

## 2019-12-01 LAB — D-DIMER, QUANTITATIVE: D-Dimer, Quant: 1.7 ug/mL-FEU — ABNORMAL HIGH (ref 0.00–0.50)

## 2019-12-01 LAB — LACTIC ACID, PLASMA: Lactic Acid, Venous: 1.4 mmol/L (ref 0.5–1.9)

## 2019-12-01 IMAGING — DX DG CHEST 1V PORT
1 series · 1 of 1 positions shown · non-contrast
Comparison: [DATE]

CLINICAL DATA: Shortness of breath COVID positive

EXAM:
PORTABLE CHEST 1 VIEW

[chest ap]
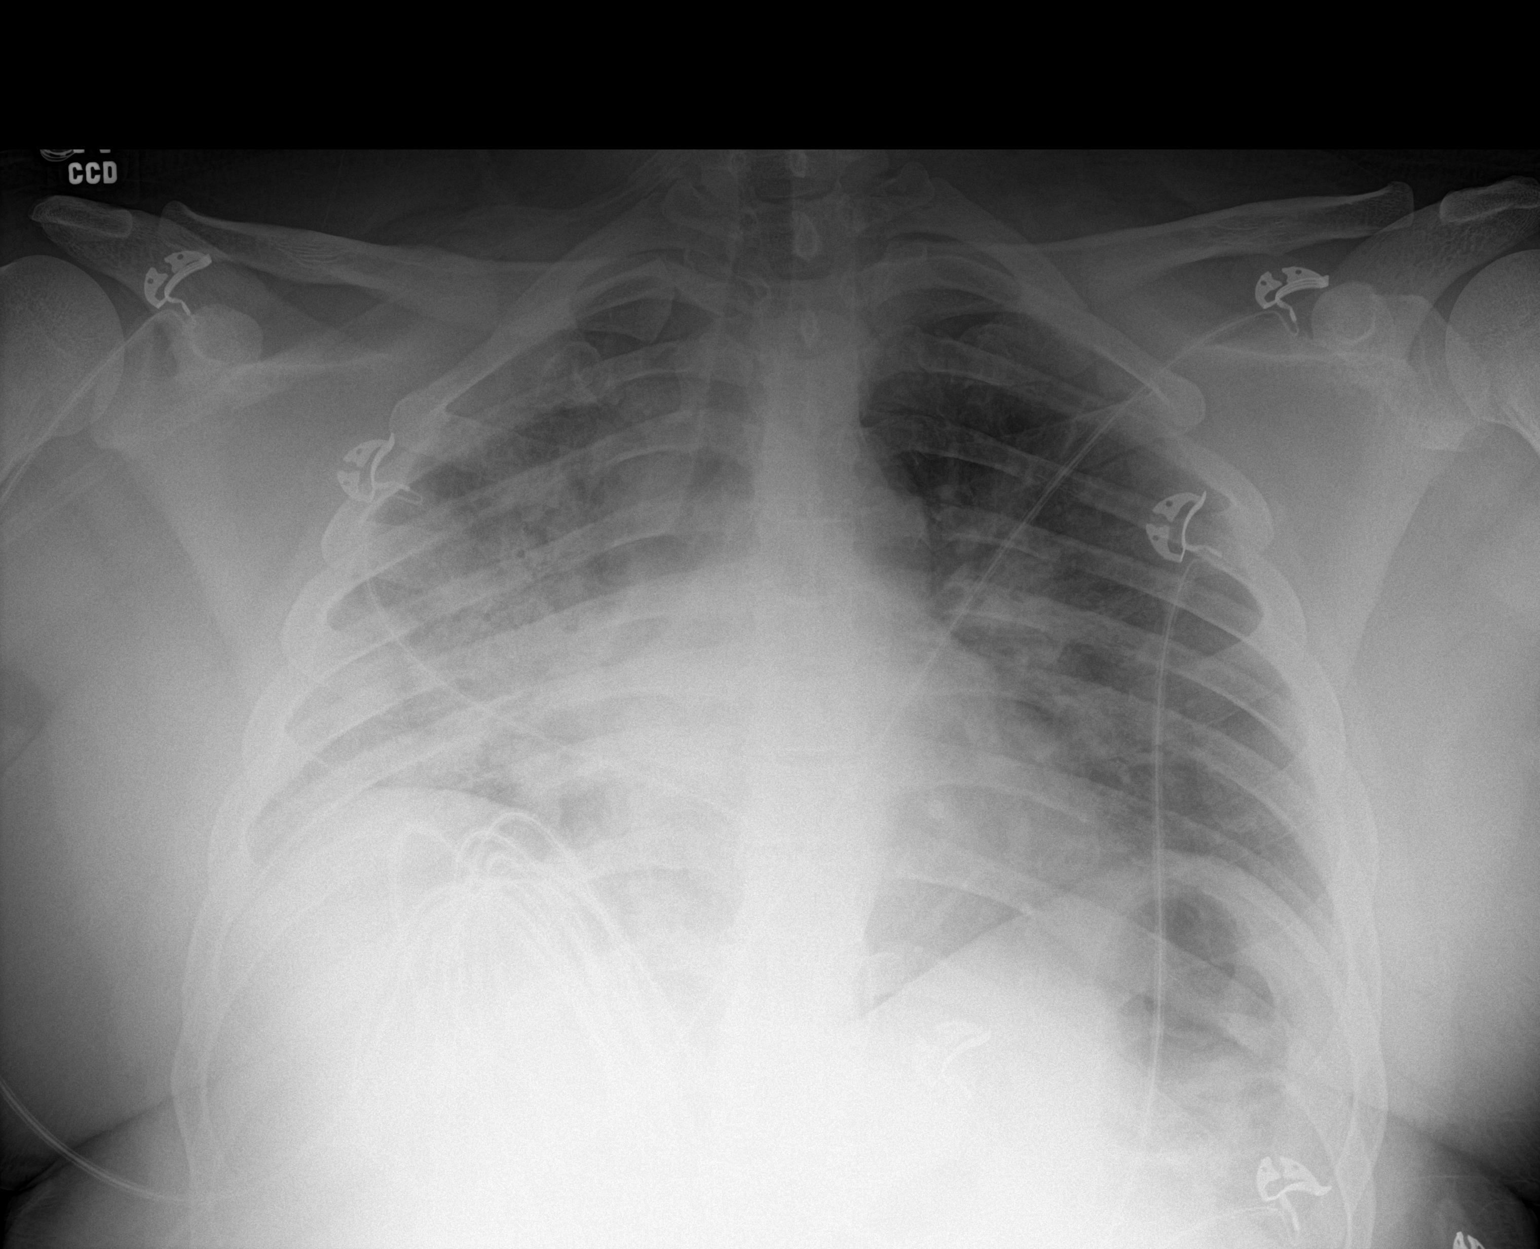

[1 of 1 positions shown; findings below may reference images not displayed]

FINDINGS: Image rotated to the RIGHT. Cardiomediastinal contours accentuated
by portable technique likely unchanged from prior radiograph.

Diffuse interstitial and airspace opacities with near confluent
airspace disease in the RIGHT chest. This developed since previous
radiograph, interstitial airspace process less pronounced in the
LEFT chest.

On limited assessment no acute skeletal process.
IMPRESSION: Diffuse interstitial and airspace opacities with near confluent
airspace disease in the RIGHT chest. This could be seen in the
setting of viral pneumonia/[LL] infection.

## 2019-12-01 MED ORDER — METHYLPREDNISOLONE SODIUM SUCC 125 MG IJ SOLR
0.5000 mg/kg | Freq: Two times a day (BID) | INTRAMUSCULAR | Status: DC
  Administered 2019-12-01 – 2019-12-02 (×2): 53.125 mg via INTRAVENOUS
  Filled 2019-12-01 (×3): qty 2

## 2019-12-01 MED ORDER — INSULIN ASPART 100 UNIT/ML ~~LOC~~ SOLN
10.0000 [IU] | Freq: Once | SUBCUTANEOUS | Status: AC
  Administered 2019-12-01: 10 [IU] via SUBCUTANEOUS

## 2019-12-01 MED ORDER — ORAL CARE MOUTH RINSE
15.0000 mL | Freq: Two times a day (BID) | OROMUCOSAL | Status: DC
  Administered 2019-12-01 – 2019-12-08 (×14): 15 mL via OROMUCOSAL

## 2019-12-01 MED ORDER — SODIUM CHLORIDE 0.9 % IV SOLN
100.0000 mg | Freq: Every day | INTRAVENOUS | Status: AC
  Administered 2019-12-02 – 2019-12-05 (×4): 100 mg via INTRAVENOUS
  Filled 2019-12-01 (×4): qty 20

## 2019-12-01 MED ORDER — ASCORBIC ACID 500 MG PO TABS
500.0000 mg | ORAL_TABLET | Freq: Every day | ORAL | Status: DC
  Administered 2019-12-01 – 2019-12-08 (×8): 500 mg via ORAL
  Filled 2019-12-01 (×8): qty 1

## 2019-12-01 MED ORDER — ONDANSETRON HCL 4 MG PO TABS: 4.0000 mg | ORAL_TABLET | Freq: Four times a day (QID) | ORAL | Status: DC | PRN

## 2019-12-01 MED ORDER — INSULIN ASPART PROT & ASPART (70-30 MIX) 100 UNIT/ML ~~LOC~~ SUSP
26.0000 [IU] | Freq: Two times a day (BID) | SUBCUTANEOUS | Status: DC
  Administered 2019-12-01 – 2019-12-02 (×2): 26 [IU] via SUBCUTANEOUS
  Filled 2019-12-01 (×2): qty 10

## 2019-12-01 MED ORDER — SODIUM CHLORIDE 0.9 % IV SOLN
2.0000 g | INTRAVENOUS | Status: DC
  Administered 2019-12-01: 2 g via INTRAVENOUS
  Filled 2019-12-01: qty 20

## 2019-12-01 MED ORDER — ZINC SULFATE 220 (50 ZN) MG PO CAPS
220.0000 mg | ORAL_CAPSULE | Freq: Every day | ORAL | Status: DC
  Administered 2019-12-01 – 2019-12-08 (×8): 220 mg via ORAL
  Filled 2019-12-01 (×8): qty 1

## 2019-12-01 MED ORDER — DEXAMETHASONE SODIUM PHOSPHATE 10 MG/ML IJ SOLN
10.0000 mg | Freq: Once | INTRAMUSCULAR | Status: AC
  Administered 2019-12-01: 10 mg via INTRAVENOUS
  Filled 2019-12-01: qty 1

## 2019-12-01 MED ORDER — AZITHROMYCIN 250 MG PO TABS
500.0000 mg | ORAL_TABLET | Freq: Every day | ORAL | Status: DC
  Administered 2019-12-01 – 2019-12-02 (×2): 500 mg via ORAL
  Filled 2019-12-01: qty 2

## 2019-12-01 MED ORDER — INSULIN ASPART 100 UNIT/ML ~~LOC~~ SOLN
0.0000 [IU] | Freq: Three times a day (TID) | SUBCUTANEOUS | Status: DC
  Administered 2019-12-02 (×3): 20 [IU] via SUBCUTANEOUS
  Administered 2019-12-03: 15 [IU] via SUBCUTANEOUS
  Administered 2019-12-03: 20 [IU] via SUBCUTANEOUS
  Administered 2019-12-03: 4 [IU] via SUBCUTANEOUS
  Administered 2019-12-04: 7 [IU] via SUBCUTANEOUS
  Administered 2019-12-04 – 2019-12-05 (×3): 15 [IU] via SUBCUTANEOUS
  Administered 2019-12-05: 20 [IU] via SUBCUTANEOUS
  Administered 2019-12-05: 11 [IU] via SUBCUTANEOUS
  Administered 2019-12-06: 15 [IU] via SUBCUTANEOUS
  Administered 2019-12-06: 11 [IU] via SUBCUTANEOUS
  Administered 2019-12-06 – 2019-12-07 (×2): 20 [IU] via SUBCUTANEOUS
  Administered 2019-12-07: 15 [IU] via SUBCUTANEOUS
  Administered 2019-12-07: 20 [IU] via SUBCUTANEOUS

## 2019-12-01 MED ORDER — ALBUTEROL SULFATE HFA 108 (90 BASE) MCG/ACT IN AERS
2.0000 | INHALATION_SPRAY | Freq: Four times a day (QID) | RESPIRATORY_TRACT | Status: DC
  Administered 2019-12-01 – 2019-12-08 (×28): 2 via RESPIRATORY_TRACT
  Filled 2019-12-01 (×3): qty 6.7

## 2019-12-01 MED ORDER — GUAIFENESIN-DM 100-10 MG/5ML PO SYRP: 10.0000 mL | ORAL_SOLUTION | ORAL | Status: DC | PRN

## 2019-12-01 MED ORDER — HYDROCOD POLST-CPM POLST ER 10-8 MG/5ML PO SUER
5.0000 mL | Freq: Two times a day (BID) | ORAL | Status: DC | PRN
  Administered 2019-12-01 – 2019-12-03 (×3): 5 mL via ORAL
  Filled 2019-12-01 (×3): qty 5

## 2019-12-01 MED ORDER — SODIUM CHLORIDE 0.9 % IV SOLN
200.0000 mg | Freq: Once | INTRAVENOUS | Status: AC
  Administered 2019-12-01: 200 mg via INTRAVENOUS
  Filled 2019-12-01: qty 200

## 2019-12-01 MED ORDER — CALCIUM GLUCONATE-NACL 1-0.675 GM/50ML-% IV SOLN
1.0000 g | Freq: Once | INTRAVENOUS | Status: AC
  Administered 2019-12-01: 1000 mg via INTRAVENOUS
  Filled 2019-12-01: qty 50

## 2019-12-01 MED ORDER — INSULIN ASPART 100 UNIT/ML ~~LOC~~ SOLN
20.0000 [IU] | Freq: Once | SUBCUTANEOUS | Status: AC
  Administered 2019-12-01: 20 [IU] via SUBCUTANEOUS

## 2019-12-01 MED ORDER — BARICITINIB 2 MG PO TABS
4.0000 mg | ORAL_TABLET | Freq: Every day | ORAL | Status: DC
  Administered 2019-12-01 – 2019-12-08 (×8): 4 mg via ORAL
  Filled 2019-12-01 (×8): qty 2

## 2019-12-01 MED ORDER — INSULIN ASPART 100 UNIT/ML ~~LOC~~ SOLN
0.0000 [IU] | Freq: Three times a day (TID) | SUBCUTANEOUS | Status: DC
  Administered 2019-12-01: 9 [IU] via SUBCUTANEOUS
  Filled 2019-12-01: qty 0.09

## 2019-12-01 MED ORDER — ONDANSETRON HCL 4 MG/2ML IJ SOLN: 4.0000 mg | Freq: Four times a day (QID) | INTRAMUSCULAR | Status: DC | PRN

## 2019-12-01 MED ORDER — ENOXAPARIN SODIUM 40 MG/0.4ML ~~LOC~~ SOLN
40.0000 mg | SUBCUTANEOUS | Status: DC
  Administered 2019-12-01: 40 mg via SUBCUTANEOUS
  Filled 2019-12-01: qty 0.4

## 2019-12-01 MED ORDER — PREDNISONE 50 MG PO TABS: 50.0000 mg | ORAL_TABLET | Freq: Every day | ORAL | Status: DC

## 2019-12-01 MED ORDER — SODIUM CHLORIDE 0.9% FLUSH
3.0000 mL | Freq: Two times a day (BID) | INTRAVENOUS | Status: DC
  Administered 2019-12-01 – 2019-12-08 (×15): 3 mL via INTRAVENOUS

## 2019-12-01 MED ORDER — FAMOTIDINE 20 MG PO TABS
20.0000 mg | ORAL_TABLET | Freq: Two times a day (BID) | ORAL | Status: DC
  Administered 2019-12-01 – 2019-12-08 (×14): 20 mg via ORAL
  Filled 2019-12-01 (×15): qty 1

## 2019-12-01 MED ORDER — ACETAMINOPHEN 325 MG PO TABS
650.0000 mg | ORAL_TABLET | Freq: Once | ORAL | Status: AC
  Administered 2019-12-01: 650 mg via ORAL
  Filled 2019-12-01: qty 2

## 2019-12-01 MED ORDER — ACETAMINOPHEN 325 MG PO TABS
650.0000 mg | ORAL_TABLET | Freq: Four times a day (QID) | ORAL | Status: DC | PRN
  Administered 2019-12-02 – 2019-12-07 (×5): 650 mg via ORAL
  Filled 2019-12-01 (×5): qty 2

## 2019-12-01 NOTE — H&P (Signed)
History and Physical    Aaron Woods TOI:[id number] DOB: Apr 01, 1985 DOA: 12/01/2019  Referring MD/NP/PA:  PCP: Horald Pollen, MD  Patient coming from:  home  Chief Complaint: Cough and shortness of breath  I have personally briefly reviewed patient's old medical records in [place]   HPI: Aaron Woods is a 34 y.o. male with medical history significant of insulin-dependent diabetes mellitus, eczema, and obesity preseents with worsening cough and shortness of breath.  Symptoms had initially started about a week ago with reports of headache and a productive cough.  He did not receive any of the COVID-19 vaccines as he does not believe in vaccines.  Associated symptoms included fever, myalgias, chest pain from coughing, lack of smell/taste, and poor appetite.  Denied having any significant nausea vomiting, or diarrhea symptoms.  He was initially tested on 9/3 for COVID-19 and was notified on 9/5 that results were positive.  His wife was able to show positive test.  ED Course: Upon admission into the emergency department patient was noted to be febrile up to 101.9 F, pulse 115-116, respirations 19-26, and O2 saturation noted to be as low as 75% on room air improved 3 L nasal cannula oxygen to 92%.  Chest x-ray showed bilateral infiltrates with near confluent disease on the right lung.  Blood cultures have been obtained.  Patient had been given 650 mg of Tylenol, Decadron 10 mg IV, and started on remdesivir.  TRH called to admit.  Review of Systems  Constitutional: Positive for fever and malaise/fatigue.  HENT: Negative for ear discharge and nosebleeds.   Eyes: Negative for photophobia and pain.  Respiratory: Positive for cough and shortness of breath.   Cardiovascular: Positive for chest pain. Negative for leg swelling.  Gastrointestinal: Negative for nausea and vomiting.  Genitourinary: Negative for dysuria and hematuria.  Musculoskeletal: Positive for myalgias. Negative  for back pain.  Skin: Positive for rash.  Neurological: Positive for weakness. Negative for loss of consciousness.  Psychiatric/Behavioral: Negative for substance abuse.    Past Medical History:  Diagnosis Date  . Diabetes mellitus     History reviewed. No pertinent surgical history.   reports that he has never smoked. He has never used smokeless tobacco. He reports that he does not drink alcohol and does not use drugs.  No Known Allergies  Family History  Problem Relation Age of Onset  . Diabetes Mother     Prior to Admission medications   Medication Sig Start Date End Date Taking? Authorizing Provider  atorvastatin (LIPITOR) 20 MG tablet Take 1 tablet (20 mg total) by mouth daily. 04/20/19   Shamleffer, Melanie Crazier, MD  blood glucose meter kit and supplies by Other route as directed. Dispense based on patient and insurance preference. Use up to four times daily as directed. (FOR ICD-10 E10.9, E11.9).    [provider]  Insulin Lispro Prot & Lispro (HUMALOG MIX 75/25 KWIKPEN) (75-25) 100 UNIT/ML Kwikpen Inject 26 Units into the skin 2 (two) times daily with a meal. 07/19/19   Shamleffer, Melanie Crazier, MD  Insulin Pen Needle 31G X 5 MM MISC 1 Device by Does not apply route 2 (two) times daily. 01/04/19   Shamleffer, Melanie Crazier, MD  ketoconazole (NIZORAL) 2 % shampoo Apply 1 application topically 2 (two) times a week.    [provider]  liraglutide (VICTOZA) 18 MG/3ML SOPN Inject 0.1 mLs (0.6 mg total) into the skin daily. Increase to 1.79m daily after first week. Patient taking differently: Inject  1.2 mg into the skin daily.  11/02/18   Horald Pollen, MD  metFORMIN (GLUCOPHAGE) 500 MG tablet Take 1 tablet (500 mg total) by mouth daily with breakfast. Patient not taking: Reported on 07/19/2019 04/20/19   Shamleffer, Melanie Crazier, MD    Physical Exam:  Constitutional: Young male who appears to be acutely ill Vitals:   12/01/19 0901 12/01/19  0917 12/01/19 0921 12/01/19 1033  BP:  132/77  128/77  Pulse:  (!) 115  (!) 116  Resp:  (!) 26  19  Temp:  (!) 101.4 F (38.6 C)  (!) 101.9 F (38.8 C)  TempSrc:  Oral  Oral  SpO2:  92%  92%  Weight: 106.6 kg     Height: 5' 9"  (1.753 m)  5' 9"  (1.753 m)    Eyes: PERRL, lids and conjunctivae normal ENMT: Mucous membranes are moist. Posterior pharynx clear of any exudate or lesions. Neck: normal, supple, no masses, no thyromegaly Respiratory: Tachypneic with decreased overall aeration.  O2 saturations currently maintained on 5 L of nasal cannula oxygen.  Patient only able to talk in shortened sentences. Cardiovascular: Regular rate and rhythm, no murmurs / rubs / gallops. No extremity edema. 2+ pedal pulses. No carotid bruits.  Abdomen: no tenderness, no masses palpated. No hepatosplenomegaly. Bowel sounds positive.  Musculoskeletal: no clubbing / cyanosis. No joint deformity upper and lower extremities. Good ROM, no contractures. Normal muscle tone.  Skin: Eczema appreciated and patient is diaphoretic. Neurologic: CN 2-12 grossly intact. Sensation intact, DTR normal. Strength 5/5 in all 4.  Psychiatric: Normal judgment and insight. Alert and oriented x 3. Normal mood.     Labs on Admission: I have personally reviewed following labs and imaging studies  CBC: Recent Labs  Lab 12/01/19 1047  WBC 5.2  NEUTROABS 3.9  HGB 12.2*  HCT 38.9*  MCV 77.8*  PLT 361   Basic Metabolic Panel: Recent Labs  Lab 12/01/19 1047  NA 132*  K 4.0  CL 97*  CO2 26  GLUCOSE 357*  BUN 20  CREATININE 1.23  CALCIUM 7.9*   GFR: Estimated Creatinine Clearance: 99.9 mL/min (by C-G formula based on SCr of 1.23 mg/dL). Liver Function Tests: Recent Labs  Lab 12/01/19 1047  AST 67*  ALT 60*  ALKPHOS 82  BILITOT 0.3  PROT 6.8  ALBUMIN 3.1*   No results for input(s): LIPASE, AMYLASE in the last 168 hours. No results for input(s): AMMONIA in the last 168 hours. Coagulation Profile: No  results for input(s): INR, PROTIME in the last 168 hours. Cardiac Enzymes: No results for input(s): CKTOTAL, CKMB, CKMBINDEX, TROPONINI in the last 168 hours. BNP (last 3 results) No results for input(s): PROBNP in the last 8760 hours. HbA1C: No results for input(s): HGBA1C in the last 72 hours. CBG: Recent Labs  Lab 12/01/19 1042  GLUCAP 344*   Lipid Profile: Recent Labs    12/01/19 1047  TRIG 81   Thyroid Function Tests: No results for input(s): TSH, T4TOTAL, FREET4, T3FREE, THYROIDAB in the last 72 hours. Anemia Panel: Recent Labs    12/01/19 1047  FERRITIN 825*   Urine analysis: No results found for: COLORURINE, APPEARANCEUR, LABSPEC, PHURINE, GLUCOSEU, HGBUR, BILIRUBINUR, KETONESUR, PROTEINUR, UROBILINOGEN, NITRITE, LEUKOCYTESUR Sepsis Labs: No results found for this or any previous visit (from the past 240 hour(s)).   Radiological Exams on Admission: DG Chest Port 1 View  Result Date: 12/01/2019 CLINICAL DATA:  Shortness of breath COVID positive EXAM: PORTABLE CHEST 1 VIEW COMPARISON:  04/14/2008 FINDINGS:  Image rotated to the RIGHT. Cardiomediastinal contours accentuated by portable technique likely unchanged from prior radiograph. Diffuse interstitial and airspace opacities with near confluent airspace disease in the RIGHT chest. This developed since previous radiograph, interstitial airspace process less pronounced in the LEFT chest. On limited assessment no acute skeletal process. IMPRESSION: Diffuse interstitial and airspace opacities with near confluent airspace disease in the RIGHT chest. This could be seen in the setting of viral pneumonia/COVID-19 infection. Electronically Signed   By: Zetta Bills M.D.   On: 12/01/2019 11:21    EKG: Independently reviewed.  Sinus tachycardia 111 bpm.  Assessment/Plan  Acute respiratory failure with hypoxia secondary to pneumonia due to COVID-19 virus: Patient presented with worsening symptoms after recently been diagnosed  with COVID-19.  Initial O2 saturations noted to be as low as 75% on room air currently O2 saturations maintained on 5 L of nasal cannula oxygen.  Chest x-ray showing diffuse disease with near complete lower airspace disease on the right side of the chest.  Labs significant for CRP 19.5, pro calcitonin 0.17, LDH 492, D-dimer 1.7, and fibrinogen 553.  Patient symptoms appear to be severe and has a possibility to worsen quickly. -Admit to a medical telemetry bed -COVID-19 order set utilized -Continuous pulse oximetry with nasal cannula oxygen to maintain O2 saturation greater than 90% -Remdesivir per pharmacy -Decadron changed to Solu-Medrol IV -Start empiric antibiotics of Rocephin and azithromycin due to patient's history of uncontrolled diabetes -Baricitinib  -Albuterol inhaler -Antitussives as needed -Continue to monitor inflammatory markers daily  Sepsis secondary to COVID-19: Patient was noted to be febrile up to 101.9 F with tachycardia and tachypnea meeting SIRS criteria.  Symptoms secondary to COVID-19 with acute respiratory failure.  Question the possibility of a bacterial component with patient's diabetes being uncontrolled. -Follow-up blood and sputum cultures -Continue empiric antibiotics as seen above and de-escalate when medically appropriate   Diabetes mellitus type 2 uncontrolled: On admission patient's blood sugar elevated up to 357 without elevated anion gap and signs of the pseudohyponatremia.  His last hemoglobin A1c was noted to be 11 back in April of this year.  Patient's home regimen includes Humalog 75/25 mix 26 units twice daily with meals. -Hypoglycemic protocols -Continue home insulin regimen -CBGs before every meal with sensitive SSI -Adjust insulin regimen as needed  Microcytic anemia: chronic. Hemoglobin 12.2 with low MCV and MCH.  Denied any reports of bleeding. -Check iron studies in a.m  Eczema  Obesity: BMI 34.7 kg/m  DVT prophylaxis: Lovenox Code  Status: Full Family Communication: Wife updated over the phone Disposition Plan: Likely discharge home over the next 3-5 days. Consults called: None Admission status: Inpatient  Norval Morton MD [facility] Pager [phone]   If 7PM-7AM, please contact night-coverage [email or web address] Password [email or web address]  12/01/2019, 12:40 PM

## 2019-12-01 NOTE — ED Triage Notes (Signed)
Tested COVID+ Sept 3rd. S/S fever, headache, body aches, SOB, chest pain. SOB got worse so pt decided to come in today.

## 2019-12-01 NOTE — Progress Notes (Signed)
Flutter Valve not given due to on back order.

## 2019-12-01 NOTE — ED Provider Notes (Signed)
[facility] DEPT Provider Note   CSN: [id number] Arrival date & time: 12/01/19  0857     History Chief Complaint  Patient presents with  . Covid Positive  . Shortness of Breath    Aaron Woods is a 34 y.o. male.  The history is provided by the patient and medical records. No language interpreter was used.  Shortness of Breath  Aaron Woods is a 34 y.o. male who presents to the Emergency Department complaining of difficulty breathing. He presents the emergency department complaining of difficulty breathing that began yesterday. On September 2 he began feeling poorly with cough and headache. On September 5 he tested positive for COVID-19. Yesterday he developed shortness of breath with worsening cough. Cough is productive of mucus that is occasionally pink. He has associated chest soreness when he coughs. He has headache, nausea. He does have a history of diabetes and has been compliant with his medications. No history of lung disease or DVT/PE. He has not been vaccinated for COVID-19. Symptoms are severe, constant, worsening. He states with supplemental oxygen his breathing does feel improved.    Past Medical History:  Diagnosis Date  . Diabetes mellitus     Patient Active Problem List   Diagnosis Date Noted  . Pneumonia due to COVID-19 virus 12/01/2019  . 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 Dyslipidemia 01/04/2019  . Dyslipidemia associated with type 2 diabetes mellitus ([place]) 11/30/2018  . Uncontrolled type 2 diabetes mellitus with hyperglycemia ([place]) 11/02/2018  . Chronic pain of both knees 11/02/2018    History reviewed. No pertinent surgical history.     Family History  Problem Relation Age of Onset  . Diabetes Mother     Social History   Tobacco Use  . Smoking status: Never Smoker  . Smokeless  tobacco: Never Used  Substance Use Topics  . Alcohol use: No  . Drug use: No    Home Medications Prior to Admission medications   Medication Sig Start Date End Date Taking? Authorizing Provider  acetaminophen (TYLENOL) 325 MG tablet Take 650 mg by mouth every 6 (six) hours as needed for mild pain or headache.   Yes [provider]  Insulin Lispro Prot & Lispro (HUMALOG MIX 75/25 KWIKPEN) (75-25) 100 UNIT/ML Kwikpen Inject 26 Units into the skin 2 (two) times daily with a meal. 07/19/19  Yes Shamleffer, Melanie Crazier, MD  liraglutide (VICTOZA) 18 MG/3ML SOPN Inject 0.1 mLs (0.6 mg total) into the skin daily. Increase to 1.11m daily after first week. Patient taking differently: Inject 1.2 mg into the skin daily.  11/02/18  Yes Sagardia, MInes Bloomer MD  atorvastatin (LIPITOR) 20 MG tablet Take 1 tablet (20 mg total) by mouth daily. Patient not taking: Reported on 12/01/2019 04/20/19   Shamleffer, IMelanie Crazier MD  blood glucose meter kit and supplies by Other route as directed. Dispense based on patient and insurance preference. Use up to four times daily as directed. (FOR ICD-10 E10.9, E11.9).    [provider]  Insulin Pen Needle 31G X 5 MM MISC 1 Device by Does not apply route 2 (two) times daily. 01/04/19   Shamleffer, IMelanie Crazier MD  metFORMIN (GLUCOPHAGE) 500 MG tablet Take 1 tablet (500 mg total) by mouth daily with breakfast. Patient not taking: Reported on 07/19/2019 04/20/19   Shamleffer, IMelanie Crazier MD    Allergies    Patient  has no known allergies.  Review of Systems   Review of Systems  Respiratory: Positive for shortness of breath.   All other systems reviewed and are negative.   Physical Exam Updated Vital Signs BP (!) 143/68   Pulse 99   Temp (!) 101.9 F (38.8 C) (Oral)   Resp 18   Ht $R'5\' 9"'YC$  (1.753 m)   Wt 106.6 kg   SpO2 92%   BMI 34.70 kg/m   Physical Exam Vitals and nursing note reviewed.  Constitutional:      Appearance: He is  well-developed.  HENT:     Head: Normocephalic and atraumatic.  Cardiovascular:     Rate and Rhythm: Regular rhythm. Tachycardia present.     Heart sounds: No murmur heard.   Pulmonary:     Effort: Pulmonary effort is normal. No respiratory distress.     Comments: Good air movement bilaterally. Occasional crackles Abdominal:     Palpations: Abdomen is soft.     Tenderness: There is no abdominal tenderness. There is no guarding or rebound.  Musculoskeletal:        General: No tenderness.  Skin:    General: Skin is warm and dry.  Neurological:     Mental Status: He is alert and oriented to person, place, and time.  Psychiatric:        Behavior: Behavior normal.     ED Results / Procedures / Treatments   Labs (all labs ordered are listed, but only abnormal results are displayed) Labs Reviewed  CBC WITH DIFFERENTIAL/PLATELET - Abnormal; Notable for the following components:      Result Value   Hemoglobin 12.2 (*)    HCT 38.9 (*)    MCV 77.8 (*)    MCH 24.4 (*)    All other components within normal limits  COMPREHENSIVE METABOLIC PANEL - Abnormal; Notable for the following components:   Sodium 132 (*)    Chloride 97 (*)    Glucose, Bld 357 (*)    Calcium 7.9 (*)    Albumin 3.1 (*)    AST 67 (*)    ALT 60 (*)    All other components within normal limits  D-DIMER, QUANTITATIVE (NOT AT [facility]) - Abnormal; Notable for the following components:   D-Dimer, Quant 1.70 (*)    All other components within normal limits  LACTATE DEHYDROGENASE - Abnormal; Notable for the following components:   LDH 492 (*)    All other components within normal limits  FERRITIN - Abnormal; Notable for the following components:   Ferritin 825 (*)    All other components within normal limits  FIBRINOGEN - Abnormal; Notable for the following components:   Fibrinogen 553 (*)    All other components within normal limits  C-REACTIVE PROTEIN - Abnormal; Notable for the following components:   CRP 19.5  (*)    All other components within normal limits  CBG MONITORING, ED - Abnormal; Notable for the following components:   Glucose-Capillary 344 (*)    All other components within normal limits  CULTURE, BLOOD (ROUTINE X 2)  CULTURE, BLOOD (ROUTINE X 2)  LACTIC ACID, PLASMA  PROCALCITONIN  TRIGLYCERIDES  LACTIC ACID, PLASMA    EKG EKG Interpretation  Date/Time:  Thursday December 01 2019 10:44:39 EDT Ventricular Rate:  111 PR Interval:    QRS Duration: 80 QT Interval:  329 QTC Calculation: 447 R Axis:   -4 Text Interpretation: Sinus tachycardia Probable left atrial enlargement Low voltage, precordial leads Abnormal R-wave progression, early transition  Confirmed by Quintella Reichert [phone]) on 12/01/2019 12:11:44 PM   Radiology DG Chest Port 1 View  Result Date: 12/01/2019 CLINICAL DATA:  Shortness of breath COVID positive EXAM: PORTABLE CHEST 1 VIEW COMPARISON:  04/14/2008 FINDINGS: Image rotated to the RIGHT. Cardiomediastinal contours accentuated by portable technique likely unchanged from prior radiograph. Diffuse interstitial and airspace opacities with near confluent airspace disease in the RIGHT chest. This developed since previous radiograph, interstitial airspace process less pronounced in the LEFT chest. On limited assessment no acute skeletal process. IMPRESSION: Diffuse interstitial and airspace opacities with near confluent airspace disease in the RIGHT chest. This could be seen in the setting of viral pneumonia/COVID-19 infection. Electronically Signed   By: Zetta Bills M.D.   On: 12/01/2019 11:21    Procedures Procedures (including critical care time) CRITICAL CARE Performed by: Quintella Reichert   Total critical care time: 35 minutes  Critical care time was exclusive of separately billable procedures and treating other patients.  Critical care was necessary to treat or prevent imminent or life-threatening deterioration.  Critical care was time spent personally by  me on the following activities: development of treatment plan with patient and/or surrogate as well as nursing, discussions with consultants, evaluation of patient's response to treatment, examination of patient, obtaining history from patient or surrogate, ordering and performing treatments and interventions, ordering and review of laboratory studies, ordering and review of radiographic studies, pulse oximetry and re-evaluation of patient's condition.  Medications Ordered in ED Medications  remdesivir 200 mg in sodium chloride 0.9% 250 mL IVPB (has no administration in time range)    Followed by  remdesivir 100 mg in sodium chloride 0.9 % 100 mL IVPB (has no administration in time range)  insulin aspart protamine- aspart (NOVOLOG MIX 70/30) injection 26 Units (has no administration in time range)  enoxaparin (LOVENOX) injection 40 mg (has no administration in time range)  sodium chloride flush (NS) 0.9 % injection 3 mL (has no administration in time range)  ondansetron (ZOFRAN) tablet 4 mg (has no administration in time range)    Or  ondansetron (ZOFRAN) injection 4 mg (has no administration in time range)  acetaminophen (TYLENOL) tablet 650 mg (has no administration in time range)  famotidine (PEPCID) tablet 20 mg (has no administration in time range)  ascorbic acid (VITAMIN C) tablet 500 mg (has no administration in time range)  zinc sulfate capsule 220 mg (has no administration in time range)  insulin aspart (novoLOG) injection 0-9 Units (has no administration in time range)  guaiFENesin-dextromethorphan (ROBITUSSIN DM) 100-10 MG/5ML syrup 10 mL (has no administration in time range)  chlorpheniramine-HYDROcodone (TUSSIONEX) 10-8 MG/5ML suspension 5 mL (has no administration in time range)  methylPREDNISolone sodium succinate (SOLU-MEDROL) 125 mg/2 mL injection 53.125 mg (has no administration in time range)    Followed by  predniSONE (DELTASONE) tablet 50 mg (has no administration in time  range)  albuterol (VENTOLIN HFA) 108 (90 Base) MCG/ACT inhaler 2 puff (has no administration in time range)  acetaminophen (TYLENOL) tablet 650 mg (650 mg Oral Given 12/01/19 1048)  dexamethasone (DECADRON) injection 10 mg (10 mg Intravenous Given 12/01/19 1055)    ED Course  I have reviewed the triage vital signs and the nursing notes.  Pertinent labs & imaging results that were available during my care of the patient were reviewed by me and considered in my medical decision making (see chart for details).    MDM Rules/Calculators/A&P  Patient with history of diabetes, recent diagnosis of COVID-19 here for evaluation of shortness of breath since yesterday. Patient initially hypoxic but his oxygen saturations improved after application of supplemental oxygen. With the supplemental oxygen his shortness of breath also improved. Chest x-ray with multifocal infiltrates consistent with COVID-19 pneumonia. D dimer is mildly elevated but in setting of COVID feel that hypoxia is more likely due to viral infection over PE. He was treated with Decadron, supplemental fluids, Tylenol for fever control as well as return data severe. Hospitalist consulted for admission.  Aaron Woods was evaluated in Emergency Department on 12/01/2019 for the symptoms described in the history of present illness. He was evaluated in the context of the global COVID-19 pandemic, which necessitated consideration that the patient might be at risk for infection with the SARS-CoV-2 virus that causes COVID-19. Institutional protocols and algorithms that pertain to the evaluation of patients at risk for COVID-19 are in a state of rapid change based on information released by regulatory bodies including the CDC and federal and state organizations. These policies and algorithms were followed during the patient's care in the ED.  Final Clinical Impression(s) / ED Diagnoses Final diagnoses:  Pneumonia due to COVID-19  virus    Rx / DC Orders ED Discharge Orders    None       Quintella Reichert, MD 12/01/19 1321

## 2019-12-02 DIAGNOSIS — E1165 Type 2 diabetes mellitus with hyperglycemia: Secondary | ICD-10-CM

## 2019-12-02 LAB — CBC WITH DIFFERENTIAL/PLATELET
Abs Immature Granulocytes: 0.01 10*3/uL (ref 0.00–0.07)
Basophils Absolute: 0 10*3/uL (ref 0.0–0.1)
Basophils Relative: 0 %
Eosinophils Absolute: 0 10*3/uL (ref 0.0–0.5)
Eosinophils Relative: 0 %
HCT: 40.6 % (ref 39.0–52.0)
Hemoglobin: 12.2 g/dL — ABNORMAL LOW (ref 13.0–17.0)
Immature Granulocytes: 0 %
Lymphocytes Relative: 27 %
Lymphs Abs: 1.2 10*3/uL (ref 0.7–4.0)
MCH: 24 pg — ABNORMAL LOW (ref 26.0–34.0)
MCHC: 30 g/dL (ref 30.0–36.0)
MCV: 79.8 fL — ABNORMAL LOW (ref 80.0–100.0)
Monocytes Absolute: 0.2 10*3/uL (ref 0.1–1.0)
Monocytes Relative: 5 %
Neutro Abs: 2.8 10*3/uL (ref 1.7–7.7)
Neutrophils Relative %: 68 %
Platelets: 278 10*3/uL (ref 150–400)
RBC: 5.09 MIL/uL (ref 4.22–5.81)
RDW: 13.9 % (ref 11.5–15.5)
WBC: 4.2 10*3/uL (ref 4.0–10.5)
nRBC: 0 % (ref 0.0–0.2)

## 2019-12-02 LAB — PHOSPHORUS: Phosphorus: 3.6 mg/dL (ref 2.5–4.6)

## 2019-12-02 LAB — COMPREHENSIVE METABOLIC PANEL
ALT: 64 U/L — ABNORMAL HIGH (ref 0–44)
AST: 72 U/L — ABNORMAL HIGH (ref 15–41)
Albumin: 2.9 g/dL — ABNORMAL LOW (ref 3.5–5.0)
Alkaline Phosphatase: 71 U/L (ref 38–126)
Anion gap: 14 (ref 5–15)
BUN: 27 mg/dL — ABNORMAL HIGH (ref 6–20)
CO2: 23 mmol/L (ref 22–32)
Calcium: 8.4 mg/dL — ABNORMAL LOW (ref 8.9–10.3)
Chloride: 99 mmol/L (ref 98–111)
Creatinine, Ser: 1.33 mg/dL — ABNORMAL HIGH (ref 0.61–1.24)
GFR calc Af Amer: >60 mL/min (ref >60)
GFR calc non Af Amer: >60 mL/min (ref >60)
Glucose, Bld: 336 mg/dL — ABNORMAL HIGH (ref 70–99)
Potassium: 4.4 mmol/L (ref 3.5–5.1)
Sodium: 136 mmol/L (ref 135–145)
Total Bilirubin: 0.3 mg/dL (ref 0.3–1.2)
Total Protein: 6.7 g/dL (ref 6.5–8.1)

## 2019-12-02 LAB — IRON AND TIBC
Iron: 21 ug/dL — ABNORMAL LOW (ref 45–182)
Saturation Ratios: 8 % — ABNORMAL LOW (ref 17.9–39.5)
TIBC: 271 ug/dL (ref 250–450)
UIBC: 250 ug/dL

## 2019-12-02 LAB — D-DIMER, QUANTITATIVE: D-Dimer, Quant: 1.22 ug/mL-FEU — ABNORMAL HIGH (ref 0.00–0.50)

## 2019-12-02 LAB — HEMOGLOBIN A1C
Hgb A1c MFr Bld: 8.8 % — ABNORMAL HIGH (ref 4.8–5.6)
Mean Plasma Glucose: 205.86 mg/dL

## 2019-12-02 LAB — GLUCOSE, CAPILLARY
Glucose-Capillary: 345 mg/dL — ABNORMAL HIGH (ref 70–99)
Glucose-Capillary: 371 mg/dL — ABNORMAL HIGH (ref 70–99)
Glucose-Capillary: 384 mg/dL — ABNORMAL HIGH (ref 70–99)
Glucose-Capillary: 366 mg/dL — ABNORMAL HIGH (ref 70–99)
Glucose-Capillary: 266 mg/dL — ABNORMAL HIGH (ref 70–99)

## 2019-12-02 LAB — C-REACTIVE PROTEIN: CRP: 20.9 mg/dL — ABNORMAL HIGH (ref <1.0)

## 2019-12-02 LAB — MAGNESIUM: Magnesium: 2.4 mg/dL (ref 1.7–2.4)

## 2019-12-02 LAB — FERRITIN: Ferritin: 814 ng/mL — ABNORMAL HIGH (ref 24–336)

## 2019-12-02 MED ORDER — METHYLPREDNISOLONE SODIUM SUCC 125 MG IJ SOLR
60.0000 mg | Freq: Two times a day (BID) | INTRAMUSCULAR | Status: DC
  Administered 2019-12-02 – 2019-12-07 (×10): 60 mg via INTRAVENOUS
  Filled 2019-12-02 (×10): qty 2

## 2019-12-02 MED ORDER — INSULIN ASPART 100 UNIT/ML ~~LOC~~ SOLN
8.0000 [IU] | Freq: Three times a day (TID) | SUBCUTANEOUS | Status: DC
  Administered 2019-12-02 – 2019-12-04 (×7): 8 [IU] via SUBCUTANEOUS

## 2019-12-02 MED ORDER — ENOXAPARIN SODIUM 60 MG/0.6ML ~~LOC~~ SOLN
55.0000 mg | SUBCUTANEOUS | Status: DC
  Administered 2019-12-02 – 2019-12-07 (×6): 55 mg via SUBCUTANEOUS
  Filled 2019-12-02 (×6): qty 0.6

## 2019-12-02 MED ORDER — PHENOL 1.4 % MT LIQD
1.0000 | OROMUCOSAL | Status: DC | PRN
  Administered 2019-12-02: 1 via OROMUCOSAL
  Filled 2019-12-02: qty 177

## 2019-12-02 MED ORDER — SODIUM CHLORIDE 0.45 % IV SOLN: INTRAVENOUS | Status: AC

## 2019-12-02 MED ORDER — INSULIN DETEMIR 100 UNIT/ML ~~LOC~~ SOLN
30.0000 [IU] | Freq: Two times a day (BID) | SUBCUTANEOUS | Status: DC
  Administered 2019-12-02 – 2019-12-03 (×3): 30 [IU] via SUBCUTANEOUS
  Filled 2019-12-02 (×4): qty 0.3

## 2019-12-02 NOTE — Progress Notes (Signed)
Inpatient Diabetes Program Recommendations  AACE/ADA: New Consensus Statement on Inpatient Glycemic Control (2015)  Target Ranges:  Prepandial:   less than 140 mg/dL      Peak postprandial:   less than 180 mg/dL (1-2 hours)      Critically ill patients:  140 - 180 mg/dL   Lab Results  Component Value Date   GLUCAP 371 (H) 12/02/2019   HGBA1C 8.8 (H) 12/01/2019    Review of Glycemic Control  Diabetes history: DM2 Outpatient Diabetes medications: Humalog 75/25 26 units bid, metformin 500 mg QAM (not taking), Victoza 1.2 mg QD Current orders for Inpatient glycemic control: 70/30 26 units bid, Novolog 0-20 units tidwc  HgbA1C 8.8%  Inpatient Diabetes Program Recommendations:     Increase 70/30 to 32 units bid Add Novolog HS correction  Will speak with pt about HgbA1C and goal of improved glycemic control.   Continue to follow.   Thank you. Ailene Ards, RD, LDN, CDE Inpatient Diabetes Coordinator [phone]

## 2019-12-02 NOTE — Progress Notes (Addendum)
PROGRESS NOTE  Aaron Woods VZD:[id number] DOB: 10-01-1985 DOA: 12/01/2019  PCP: Georgina Quint, MD  Brief History/Interval Summary: 34 year old with past medical history of insulin-dependent diabetes mellitus, hyperlipidemia, obesity presented to the emergency room on 9/9 with cough and shortness of breath.  Patient had recently tested positive for COVID-19 on 9/5.  He is unvaccinated.  Noted to be hypoxic with oxygen saturations in the 70s.  Admitted to the hospital service and started on Remdisivir.  Initially requiring 5 L.  Also noted to have mild volume overload with an elevated BNP of 277.  Patient given a dose of Lasix on 9/10 and had significant diuresis.  Today, patient is doing much better, down to 2 L nasal cannula.  He feels better.  Echocardiogram ordered and is pending  Reason for Visit: Pneumonia due to COVID-19.  Acute respiratory failure with hypoxia  Consultants: None  Procedures: Echocardiogram pending  Antibiotics: Anti-infectives (From admission, onward)   Start     Dose/Rate Route Frequency Ordered Stop   12/02/19 1000  remdesivir 100 mg in sodium chloride 0.9 % 100 mL IVPB       "Followed by" Linked Group Details   100 mg 200 mL/hr over 30 Minutes Intravenous Daily 12/01/19 1219 12/06/19 0959   12/01/19 2000  cefTRIAXone (ROCEPHIN) 2 g in sodium chloride 0.9 % 100 mL IVPB        2 g 200 mL/hr over 30 Minutes Intravenous Every 24 hours 12/01/19 1857     12/01/19 2000  azithromycin (ZITHROMAX) tablet 500 mg        500 mg Oral Daily 12/01/19 1857     12/01/19 1300  remdesivir 200 mg in sodium chloride 0.9% 250 mL IVPB       "Followed by" Linked Group Details   200 mg 580 mL/hr over 30 Minutes Intravenous Once 12/01/19 1219 12/01/19 1433        Assessment/Plan:  Acute Hypoxic Resp. Failure/Pneumonia due to COVID-19/  Recent Labs  Lab 12/01/19 1047 12/02/19 0427  DDIMER 1.70* 1.22*  FERRITIN 825* 814*  CRP 19.5* 20.9*  ALT 60* 64*    PROCALCITON 0.17  --     Objective findings: Fever: Patient was febrile yesterday with a temperature of 101.9. Oxygen requirements: Currently on 4 to 5 L by nasal cannula saturating in the late 80s to early 90s.  COVID 19 Therapeutics: Antibacterials: Procalcitonin 0.17.  Patient's WBC was normal.  Patient noted to be on antibacterials, but no clear indication for acute infection, so stopped on 9/10 Remdesivir: Day 3 Steroids: Solu-Medrol Diuretics: None Baricitinib: Day 3 PUD Prophylaxis: Pepcid DVT Prophylaxis:  Lovenox   Sepsis ruled out  From a respiratory standpoint patient seems to be much improving.  Down to 2 L nasal cannula.  Awaiting echocardiogram, highly suspicious for acute diastolic heart failure..  Sepsis ruled out.  Continue Remdesivir steroids and baricitinib.  No clear indication to continue with antibacterial agents.  CRP trending downward.  CRP noted to be still significantly elevated.  D-dimer improved at 1.22.   The treatment plan and use of medications and known side effects were discussed with patient/family. Some of the medications used are based on case reports/anecdotal data.  All other medications being used in the management of COVID-19 based on limited study data.  Complete risks and long-term side effects are unknown, however in the best clinical judgment they seem to be of some benefit.  Patient/family wanted to proceed with treatment options provided.  Diabetes mellitus type  2, uncontrolled with hyperglycemia CBGs noted to be significantly elevated.  Most likely due to steroids.  Currently noted to be on 70/30 insulin.  Also on sliding scale coverage.  Increase Levemir coverage.  Meal coverage.  HbA1c 8.8.  Mild acute renal failure Increase in BUN and creatinine noted this morning.  We will gently hydrate.  Recheck labs tomorrow.  Microcytic anemia Anemia panel reviewed.  Ferritin 814.  Iron 21.  TIBC 271.  Outpatient  evaluation.  Transaminitis Secondary to COVID-19.  Check periodically.  Obesity Estimated body mass index is 34.7 kg/m as calculated from the following:   Height as of this encounter: 5\' 9"  (1.753 m).   Weight as of this encounter: 106.6 kg.   DVT Prophylaxis: Lovenox Code Status: Full code Family Communication: Updated wife by phone Disposition Plan: Hopefully return home when improved  Status is: Inpatient  Remains inpatient appropriate because:IV treatments appropriate due to intensity of illness or inability to take PO and Inpatient level of care appropriate due to severity of illness   Dispo: The patient is from: Home              Anticipated d/c is to: Home              Anticipated d/c date is: 9/13              Patient currently is not medically stable to d/c.       Medications:  Scheduled: . albuterol  2 puff Inhalation Q6H  . vitamin C  500 mg Oral Daily  . azithromycin  500 mg Oral Daily  . baricitinib  4 mg Oral Daily  . enoxaparin (LOVENOX) injection  55 mg Subcutaneous Q24H  . famotidine  20 mg Oral BID  . insulin aspart  0-20 Units Subcutaneous TID WC  . insulin aspart protamine- aspart  26 Units Subcutaneous BID WC  . mouth rinse  15 mL Mouth Rinse BID  . methylPREDNISolone (SOLU-MEDROL) injection  0.5 mg/kg Intravenous Q12H   Followed by  . [START ON 12/05/2019] predniSONE  50 mg Oral Daily  . sodium chloride flush  3 mL Intravenous Q12H  . zinc sulfate  220 mg Oral Daily   Continuous: . cefTRIAXone (ROCEPHIN)  IV 2 g (12/01/19 2144)  . remdesivir 100 mg in NS 100 mL 100 mg (12/02/19 1025)   02/01/20, chlorpheniramine-HYDROcodone, guaiFENesin-dextromethorphan, ondansetron **OR** ondansetron (ZOFRAN) IV, phenol   Objective:  Vital Signs  Vitals:   12/01/19 2026 12/02/19 0224 12/02/19 0551 12/02/19 1113  BP: 121/74 105/65 111/74 130/80  Pulse: 95 85 95 93  Resp: 18 20 20 20   Temp: 99.3 F (37.4 C) 98.5 F (36.9 C) 98.5 F (36.9  C) 98.5 F (36.9 C)  TempSrc: Oral Oral Oral Oral  SpO2: 95% 90% 91% (!) 87%  Weight:      Height:        Intake/Output Summary (Last 24 hours) at 12/02/2019 1209 Last data filed at 12/02/2019 1030 Gross per 24 hour  Intake 1070 ml  Output 2 ml  Net 1068 ml   Filed Weights   12/01/19 0901  Weight: 106.6 kg    General appearance: Alert and oriented x3, no acute distress HEENT: Normocephalic, atraumatic, mucous membranes are moist Resp: Decreased breath sounds bibasilar Cardio: Regular rate and rhythm, S1-S2 GI: Soft, nontender, nondistended, positive bowel sounds Extremities: No clubbing or cyanosis, trace pitting edema Psychiatry: Patient is appropriate, no evidence of psychoses Neurologic:No focal neurological deficits.    Lab Results:  Data Reviewed: I have personally reviewed following labs and imaging studies  CBC: Recent Labs  Lab 12/01/19 1047 12/02/19 0427  WBC 5.2 4.2  NEUTROABS 3.9 2.8  HGB 12.2* 12.2*  HCT 38.9* 40.6  MCV 77.8* 79.8*  PLT 272 278    Basic Metabolic Panel: Recent Labs  Lab 12/01/19 1047 12/02/19 0427  NA 132* 136  K 4.0 4.4  CL 97* 99  CO2 26 23  GLUCOSE 357* 336*  BUN 20 27*  CREATININE 1.23 1.33*  CALCIUM 7.9* 8.4*  MG  --  2.4  PHOS  --  3.6    GFR: Estimated Creatinine Clearance: 92.4 mL/min (A) (by C-G formula based on SCr of 1.33 mg/dL (H)).  Liver Function Tests: Recent Labs  Lab 12/01/19 1047 12/02/19 0427  AST 67* 72*  ALT 60* 64*  ALKPHOS 82 71  BILITOT 0.3 0.3  PROT 6.8 6.7  ALBUMIN 3.1* 2.9*     HbA1C: Recent Labs    12/01/19 1047  HGBA1C 8.8*    CBG: Recent Labs  Lab 12/01/19 1708 12/01/19 2023 12/02/19 0222 12/02/19 0751 12/02/19 1110  GLUCAP 370* 426* 345* 371* 384*    Lipid Profile: Recent Labs    12/01/19 1047  TRIG 81    Anemia Panel: Recent Labs    12/01/19 1047 12/02/19 0427  FERRITIN 825* 814*  TIBC  --  271  IRON  --  21*    Recent Results (from the past  240 hour(s))  Blood Culture (routine x 2)     Status: None (Preliminary result)   Collection Time: 12/01/19 10:47 AM   Specimen: BLOOD  Result Value Ref Range Status   Specimen Description   Final    BLOOD LEFT ANTECUBITAL Performed at [facility], [address] [place], [place] [address]    Special Requests   Final    BOTTLES DRAWN AEROBIC AND ANAEROBIC Blood Culture results may not be optimal due to an inadequate volume of blood received in culture bottles Performed at [facility], [address] [place], [place] [address]    Culture   Final    NO GROWTH < 24 HOURS Performed at [facility], [address] [place], [place] [address]    Report Status PENDING  Incomplete  Blood Culture (routine x 2)     Status: None (Preliminary result)   Collection Time: 12/01/19 10:47 AM   Specimen: BLOOD  Result Value Ref Range Status   Specimen Description   Final    BLOOD RIGHT ANTECUBITAL Performed at [facility], [address], [place], [place] [address]    Special Requests   Final    BOTTLES DRAWN AEROBIC AND ANAEROBIC Blood Culture results may not be optimal due to an inadequate volume of blood received in culture bottles Performed at [facility], [address], [place], [place] [address]    Culture   Final    NO GROWTH < 24 HOURS Performed at [facility], [address], [place], [place] [address]    Report Status PENDING  Incomplete      Radiology Studies: DG Chest Port 1 View  Result Date: 12/01/2019 CLINICAL DATA:  Shortness of breath COVID positive EXAM: PORTABLE CHEST 1 VIEW COMPARISON:  04/14/2008 FINDINGS: Image rotated to the RIGHT. Cardiomediastinal contours accentuated by portable technique likely unchanged from prior radiograph. Diffuse interstitial and airspace opacities with near confluent airspace disease in the RIGHT chest. This developed  since previous radiograph,  interstitial airspace process less pronounced in the LEFT chest. On limited assessment no acute skeletal process. IMPRESSION: Diffuse interstitial and airspace opacities with near confluent airspace disease in the RIGHT chest. This could be seen in the setting of viral pneumonia/COVID-19 infection. Electronically Signed   By: Donzetta KohutGeoffrey  Wile M.D.   On: 12/01/2019 11:21       LOS: 1 day   Gokul Teonna Coonan  [facility] Pager on [email or web address]  12/02/2019, 12:09 PM

## 2019-12-03 ENCOUNTER — Other Ambulatory Visit (HOSPITAL_COMMUNITY): Payer: 59

## 2019-12-03 LAB — CBC WITH DIFFERENTIAL/PLATELET
Abs Immature Granulocytes: 0.05 10*3/uL (ref 0.00–0.07)
Basophils Absolute: 0 10*3/uL (ref 0.0–0.1)
Basophils Relative: 0 %
Eosinophils Absolute: 0 10*3/uL (ref 0.0–0.5)
Eosinophils Relative: 0 %
HCT: 41.7 % (ref 39.0–52.0)
Hemoglobin: 12.6 g/dL — ABNORMAL LOW (ref 13.0–17.0)
Immature Granulocytes: 0 %
Lymphocytes Relative: 14 %
Lymphs Abs: 1.8 10*3/uL (ref 0.7–4.0)
MCH: 24.3 pg — ABNORMAL LOW (ref 26.0–34.0)
MCHC: 30.2 g/dL (ref 30.0–36.0)
MCV: 80.5 fL (ref 80.0–100.0)
Monocytes Absolute: 0.7 10*3/uL (ref 0.1–1.0)
Monocytes Relative: 5 %
Neutro Abs: 10.6 10*3/uL — ABNORMAL HIGH (ref 1.7–7.7)
Neutrophils Relative %: 81 %
Platelets: 363 10*3/uL (ref 150–400)
RBC: 5.18 MIL/uL (ref 4.22–5.81)
RDW: 14.3 % (ref 11.5–15.5)
WBC: 13.2 10*3/uL — ABNORMAL HIGH (ref 4.0–10.5)
nRBC: 0 % (ref 0.0–0.2)

## 2019-12-03 LAB — COMPREHENSIVE METABOLIC PANEL
ALT: 65 U/L — ABNORMAL HIGH (ref 0–44)
AST: 78 U/L — ABNORMAL HIGH (ref 15–41)
Albumin: 2.9 g/dL — ABNORMAL LOW (ref 3.5–5.0)
Alkaline Phosphatase: 78 U/L (ref 38–126)
Anion gap: 13 (ref 5–15)
BUN: 28 mg/dL — ABNORMAL HIGH (ref 6–20)
CO2: 26 mmol/L (ref 22–32)
Calcium: 8.6 mg/dL — ABNORMAL LOW (ref 8.9–10.3)
Chloride: 99 mmol/L (ref 98–111)
Creatinine, Ser: 1.02 mg/dL (ref 0.61–1.24)
GFR calc Af Amer: >60 mL/min (ref >60)
GFR calc non Af Amer: >60 mL/min (ref >60)
Glucose, Bld: 245 mg/dL — ABNORMAL HIGH (ref 70–99)
Potassium: 4.8 mmol/L (ref 3.5–5.1)
Sodium: 138 mmol/L (ref 135–145)
Total Bilirubin: 0.3 mg/dL (ref 0.3–1.2)
Total Protein: 6.4 g/dL — ABNORMAL LOW (ref 6.5–8.1)

## 2019-12-03 LAB — GLUCOSE, CAPILLARY
Glucose-Capillary: 181 mg/dL — ABNORMAL HIGH (ref 70–99)
Glucose-Capillary: 327 mg/dL — ABNORMAL HIGH (ref 70–99)
Glucose-Capillary: 373 mg/dL — ABNORMAL HIGH (ref 70–99)
Glucose-Capillary: 338 mg/dL — ABNORMAL HIGH (ref 70–99)

## 2019-12-03 LAB — D-DIMER, QUANTITATIVE: D-Dimer, Quant: 0.9 ug/mL-FEU — ABNORMAL HIGH (ref 0.00–0.50)

## 2019-12-03 LAB — BRAIN NATRIURETIC PEPTIDE: B Natriuretic Peptide: 277 pg/mL — ABNORMAL HIGH (ref 0.0–100.0)

## 2019-12-03 LAB — C-REACTIVE PROTEIN: CRP: 13 mg/dL — ABNORMAL HIGH (ref <1.0)

## 2019-12-03 MED ORDER — FUROSEMIDE 10 MG/ML IJ SOLN
40.0000 mg | Freq: Once | INTRAMUSCULAR | Status: AC
  Administered 2019-12-03: 40 mg via INTRAVENOUS
  Filled 2019-12-03: qty 4

## 2019-12-03 MED ORDER — INSULIN DETEMIR 100 UNIT/ML ~~LOC~~ SOLN
34.0000 [IU] | Freq: Two times a day (BID) | SUBCUTANEOUS | Status: DC
  Administered 2019-12-03 – 2019-12-04 (×2): 34 [IU] via SUBCUTANEOUS
  Filled 2019-12-03 (×3): qty 0.34

## 2019-12-03 MED ORDER — FUROSEMIDE 10 MG/ML IJ SOLN
20.0000 mg | Freq: Two times a day (BID) | INTRAMUSCULAR | Status: DC
  Administered 2019-12-04: 20 mg via INTRAVENOUS
  Filled 2019-12-03: qty 2

## 2019-12-03 NOTE — TOC Progression Note (Signed)
Transition of Care [facility]) - Progression Note    Patient Details  Name: INMER NIX MRN: [id number] Date of Birth: January 08, 1986  [facility] of [facility]) CM/SW Contact  Geni Bers, RN Phone Number: 12/03/2019, 9:58 AM  Clinical Narrative:    Pt from home with spouse. Plan to return home. TOC to follow for discharge needs.   Expected Discharge Plan: Home/Self Care Barriers to Discharge: No Barriers Identified  Expected Discharge Plan and Services Expected Discharge Plan: Home/Self Care       Living arrangements for the past 2 months: Single Family Home                                       Social Determinants of Health (SDOH) Interventions    Readmission Risk Interventions No flowsheet data found.

## 2019-12-04 ENCOUNTER — Inpatient Hospital Stay (HOSPITAL_COMMUNITY): Payer: 59

## 2019-12-04 DIAGNOSIS — J9601 Acute respiratory failure with hypoxia: Secondary | ICD-10-CM

## 2019-12-04 LAB — COMPREHENSIVE METABOLIC PANEL
ALT: 76 U/L — ABNORMAL HIGH (ref 0–44)
AST: 81 U/L — ABNORMAL HIGH (ref 15–41)
Albumin: 2.8 g/dL — ABNORMAL LOW (ref 3.5–5.0)
Alkaline Phosphatase: 88 U/L (ref 38–126)
Anion gap: 12 (ref 5–15)
BUN: 25 mg/dL — ABNORMAL HIGH (ref 6–20)
CO2: 30 mmol/L (ref 22–32)
Calcium: 8.4 mg/dL — ABNORMAL LOW (ref 8.9–10.3)
Chloride: 96 mmol/L — ABNORMAL LOW (ref 98–111)
Creatinine, Ser: 0.94 mg/dL (ref 0.61–1.24)
GFR calc Af Amer: >60 mL/min (ref >60)
GFR calc non Af Amer: >60 mL/min (ref >60)
Glucose, Bld: 250 mg/dL — ABNORMAL HIGH (ref 70–99)
Potassium: 4.5 mmol/L (ref 3.5–5.1)
Sodium: 138 mmol/L (ref 135–145)
Total Bilirubin: 0.3 mg/dL (ref 0.3–1.2)
Total Protein: 6.7 g/dL (ref 6.5–8.1)

## 2019-12-04 LAB — CBC WITH DIFFERENTIAL/PLATELET
Abs Immature Granulocytes: 0.11 10*3/uL — ABNORMAL HIGH (ref 0.00–0.07)
Basophils Absolute: 0 10*3/uL (ref 0.0–0.1)
Basophils Relative: 0 %
Eosinophils Absolute: 0 10*3/uL (ref 0.0–0.5)
Eosinophils Relative: 0 %
HCT: 40.1 % (ref 39.0–52.0)
Hemoglobin: 12.1 g/dL — ABNORMAL LOW (ref 13.0–17.0)
Immature Granulocytes: 1 %
Lymphocytes Relative: 11 %
Lymphs Abs: 1.4 10*3/uL (ref 0.7–4.0)
MCH: 23.8 pg — ABNORMAL LOW (ref 26.0–34.0)
MCHC: 30.2 g/dL (ref 30.0–36.0)
MCV: 78.8 fL — ABNORMAL LOW (ref 80.0–100.0)
Monocytes Absolute: 0.8 10*3/uL (ref 0.1–1.0)
Monocytes Relative: 6 %
Neutro Abs: 10.6 10*3/uL — ABNORMAL HIGH (ref 1.7–7.7)
Neutrophils Relative %: 82 %
Platelets: 455 10*3/uL — ABNORMAL HIGH (ref 150–400)
RBC: 5.09 MIL/uL (ref 4.22–5.81)
RDW: 14.2 % (ref 11.5–15.5)
WBC: 12.9 10*3/uL — ABNORMAL HIGH (ref 4.0–10.5)
nRBC: 0.2 % (ref 0.0–0.2)

## 2019-12-04 LAB — C-REACTIVE PROTEIN: CRP: 7.5 mg/dL — ABNORMAL HIGH (ref <1.0)

## 2019-12-04 LAB — D-DIMER, QUANTITATIVE: D-Dimer, Quant: 0.99 ug/mL-FEU — ABNORMAL HIGH (ref 0.00–0.50)

## 2019-12-04 LAB — GLUCOSE, CAPILLARY
Glucose-Capillary: 202 mg/dL — ABNORMAL HIGH (ref 70–99)
Glucose-Capillary: 334 mg/dL — ABNORMAL HIGH (ref 70–99)
Glucose-Capillary: 340 mg/dL — ABNORMAL HIGH (ref 70–99)
Glucose-Capillary: 284 mg/dL — ABNORMAL HIGH (ref 70–99)

## 2019-12-04 LAB — ECHOCARDIOGRAM COMPLETE
Area-P 1/2: 2.54 cm2
Height: 69 in
S' Lateral: 2.9 cm
Weight: 3760 oz

## 2019-12-04 MED ORDER — TRAMADOL HCL 50 MG PO TABS
50.0000 mg | ORAL_TABLET | ORAL | Status: DC
Start: 2019-12-04 — End: 2019-12-04

## 2019-12-04 MED ORDER — FUROSEMIDE 10 MG/ML IJ SOLN
40.0000 mg | Freq: Two times a day (BID) | INTRAMUSCULAR | Status: DC
  Administered 2019-12-04 – 2019-12-06 (×5): 40 mg via INTRAVENOUS
  Filled 2019-12-04 (×5): qty 4

## 2019-12-04 MED ORDER — TRAMADOL HCL 50 MG PO TABS
50.0000 mg | ORAL_TABLET | Freq: Four times a day (QID) | ORAL | Status: DC | PRN
  Administered 2019-12-05: 50 mg via ORAL
  Filled 2019-12-04: qty 1

## 2019-12-04 MED ORDER — TRAMADOL HCL 50 MG PO TABS
50.0000 mg | ORAL_TABLET | ORAL | Status: AC
  Administered 2019-12-04: 50 mg via ORAL
  Filled 2019-12-04: qty 1

## 2019-12-04 MED ORDER — GUAIFENESIN-DM 100-10 MG/5ML PO SYRP
5.0000 mL | ORAL_SOLUTION | ORAL | Status: DC | PRN
  Administered 2019-12-05 – 2019-12-07 (×4): 5 mL via ORAL
  Filled 2019-12-04 (×4): qty 10

## 2019-12-04 MED ORDER — INSULIN DETEMIR 100 UNIT/ML ~~LOC~~ SOLN
38.0000 [IU] | Freq: Two times a day (BID) | SUBCUTANEOUS | Status: DC
  Administered 2019-12-04: 38 [IU] via SUBCUTANEOUS
  Filled 2019-12-04 (×2): qty 0.38

## 2019-12-04 NOTE — Progress Notes (Signed)
PROGRESS NOTE  Aaron Woods BVQ:[id number] DOB: 12-30-1985 DOA: 12/01/2019  PCP: Georgina Quint, MD  Brief History/Interval Summary: 34 year old with past medical history of insulin-dependent diabetes mellitus, hyperlipidemia, obesity presented to the emergency room on 9/9 with cough and shortness of breath.  Patient had recently tested positive for COVID-19 on 9/5.  He is unvaccinated.  Noted to be hypoxic with oxygen saturations in the 70s.  Admitted to the hospital service and started on Remdisivir.  Initially requiring 5 L.  Also noted to have mild volume overload with an elevated BNP of 277.  Patient given a dose of Lasix on 9/10 and had significant diuresis.  By 9/11, patient was down to 2 L nasal cannula.  However over the next 24 h, with exertion, required more oxygen and by morning of 9/12, up to 7 L.  His main complaint is of pain when coughing.  Reason for Visit: Pneumonia due to COVID-19.  Acute respiratory failure with hypoxia  Consultants: None  Procedures: Echocardiogram done 9/12: No evidence of valvular dysfunction, normal ejection fraction.  No diastolic dysfunction.  Antibiotics: Anti-infectives (From admission, onward)   Start     Dose/Rate Route Frequency Ordered Stop   12/02/19 1000  remdesivir 100 mg in sodium chloride 0.9 % 100 mL IVPB       "Followed by" Linked Group Details   100 mg 200 mL/hr over 30 Minutes Intravenous Daily 12/01/19 1219 12/06/19 0959   12/01/19 2000  cefTRIAXone (ROCEPHIN) 2 g in sodium chloride 0.9 % 100 mL IVPB  Status:  Discontinued        2 g 200 mL/hr over 30 Minutes Intravenous Every 24 hours 12/01/19 1857 12/02/19 1229   12/01/19 2000  azithromycin (ZITHROMAX) tablet 500 mg  Status:  Discontinued        500 mg Oral Daily 12/01/19 1857 12/02/19 1229   12/01/19 1300  remdesivir 200 mg in sodium chloride 0.9% 250 mL IVPB       "Followed by" Linked Group Details   200 mg 580 mL/hr over 30 Minutes Intravenous Once 12/01/19 1219  12/01/19 1433        Assessment/Plan:  Acute Hypoxic Resp. Failure/Pneumonia due to COVID-19/  Recent Labs  Lab 12/01/19 1047 12/02/19 0427 12/03/19 0528 12/04/19 0548  DDIMER 1.70* 1.22* 0.90* 0.99*  FERRITIN 825* 814*  --   --   CRP 19.5* 20.9* 13.0* 7.5*  ALT 60* 64* 65* 76*  PROCALCITON 0.17  --   --   --     Objective findings: Fever: Patient was febrile yesterday with a temperature of 101.9. Oxygen requirements: Currently on 4 to 5 L by nasal cannula saturating in the late 80s to early 90s.  COVID 19 Therapeutics: Antibacterials: Procalcitonin 0.17.  Patient's WBC was normal.  Patient noted to be on antibacterials, but no clear indication for acute infection, so stopped on 9/10 Remdesivir: Day 4 Steroids: Solu-Medrol Diuretics: None Baricitinib: Day 4 PUD Prophylaxis: Pepcid DVT Prophylaxis:  Lovenox   Sepsis ruled out  From a respiratory standpoint patient initially was improving, but with exertion, requiring much more oxygen.  Currently on 7 L.  Mildly elevated BNP.  Although echocardiogram unremarkable, might've been mildly volume overloaded on admission, so giving low-dose Lasix.  5sepsis ruled out.  Continue Remdesivir steroids and baricitinib.  No clear indication to continue with antibacterial agents.  CRP trending downward.    The treatment plan and use of medications and known side effects were discussed with patient/family. Some of  the medications used are based on case reports/anecdotal data.  All other medications being used in the management of COVID-19 based on limited study data.  Complete risks and long-term side effects are unknown, however in the best clinical judgment they seem to be of some benefit.  Patient/family wanted to proceed with treatment options provided.  Diabetes mellitus type 2, uncontrolled with hyperglycemia CBGs noted to be significantly elevated.  Most likely due to steroids.  Currently noted to be on 70/30 insulin.  Also on  sliding scale coverage.  Continue to Levemir coverage.  Meal coverage.  HbA1c 8.8.  Mild acute renal failure Increase in BUN and creatinine on 9/10, received gentle hydration.  Now normalized.  Monitor carefully as we are giving him Lasix.  Microcytic anemia Anemia panel reviewed.  Ferritin 814.  Iron 21.  TIBC 271.  Outpatient evaluation.  Transaminitis Secondary to COVID-19.  Check periodically.  Obesity Estimated body mass index is 34.7 kg/m as calculated from the following:   Height as of this encounter: 5\' 9"  (1.753 m).   Weight as of this encounter: 106.6 kg.   DVT Prophylaxis: Lovenox Code Status: Full code Family Communication: Updated wife by phone Disposition Plan: Hopefully return home when improved  Status is: Inpatient  Remains inpatient appropriate because:IV treatments appropriate due to intensity of illness or inability to take PO and Inpatient level of care appropriate due to severity of illness   Dispo: The patient is from: Home              Anticipated d/c is to: Home              Anticipated d/c date is: 9/15              Patient currently is not medically stable to d/c.       Medications:  Scheduled:  albuterol  2 puff Inhalation Q6H   vitamin C  500 mg Oral Daily   baricitinib  4 mg Oral Daily   enoxaparin (LOVENOX) injection  55 mg Subcutaneous Q24H   famotidine  20 mg Oral BID   furosemide  40 mg Intravenous BID   insulin aspart  0-20 Units Subcutaneous TID WC   insulin aspart  8 Units Subcutaneous TID WC   insulin detemir  34 Units Subcutaneous BID   mouth rinse  15 mL Mouth Rinse BID   methylPREDNISolone (SOLU-MEDROL) injection  60 mg Intravenous Q12H   sodium chloride flush  3 mL Intravenous Q12H   zinc sulfate  220 mg Oral Daily   Continuous:  remdesivir 100 mg in NS 100 mL 100 mg (12/04/19 1000)   02/03/20, chlorpheniramine-HYDROcodone, guaiFENesin-dextromethorphan, ondansetron **OR** ondansetron (ZOFRAN) IV,  phenol, traMADol   Objective:  Vital Signs  Vitals:   12/03/19 2119 12/04/19 0517 12/04/19 0831 12/04/19 1300  BP:  (!) 134/95  131/90  Pulse:  99  98  Resp:  18  19  Temp:  98.6 F (37 C)  99.1 F (37.3 C)  TempSrc:  Oral  Oral  SpO2: (!) 89% 91% 93% 90%  Weight:      Height:        Intake/Output Summary (Last 24 hours) at 12/04/2019 1316 Last data filed at 12/04/2019 1139 Gross per 24 hour  Intake 240 ml  Output 2025 ml  Net -1785 ml   Filed Weights   12/01/19 0901  Weight: 106.6 kg    General appearance: Alert and oriented x3, no acute distress HEENT: Normocephalic, atraumatic, mucous membranes are  moist Resp: Decreased breath sounds bibasilar, limited inspiration due to pain when coughing Cardio: Regular rate and rhythm, S1-S2 GI: Soft, nontender, nondistended, positive bowel sounds Extremities: No clubbing or cyanosis, trace pitting edema Psychiatry: Patient is appropriate, no evidence of psychoses Neurologic:No focal neurological deficits.    Lab Results:  Data Reviewed: I have personally reviewed following labs and imaging studies  CBC: Recent Labs  Lab 12/01/19 1047 12/02/19 0427 12/03/19 0528 12/04/19 0548  WBC 5.2 4.2 13.2* 12.9*  NEUTROABS 3.9 2.8 10.6* 10.6*  HGB 12.2* 12.2* 12.6* 12.1*  HCT 38.9* 40.6 41.7 40.1  MCV 77.8* 79.8* 80.5 78.8*  PLT 272 278 363 455*    Basic Metabolic Panel: Recent Labs  Lab 12/01/19 1047 12/02/19 0427 12/03/19 0528 12/04/19 0548  NA 132* 136 138 138  K 4.0 4.4 4.8 4.5  CL 97* 99 99 96*  CO2 GLUCOSE 357* 336* 245* 250*  BUN 20 27* 28* 25*  CREATININE 1.23 1.33* 1.02 0.94  CALCIUM 7.9* 8.4* 8.6* 8.4*  MG  --  2.4  --   --   PHOS  --  3.6  --   --     GFR: Estimated Creatinine Clearance: 130.8 mL/min (by C-G formula based on SCr of 0.94 mg/dL).  Liver Function Tests: Recent Labs  Lab 12/01/19 1047 12/02/19 0427 12/03/19 0528 12/04/19 0548  AST 67* 72* 78* 81*  ALT 60* 64* 65*  76*  ALKPHOS 82 71 78 88  BILITOT 0.3 0.3 0.3 0.3  PROT 6.8 6.7 6.4* 6.7  ALBUMIN 3.1* 2.9* 2.9* 2.8*     HbA1C: No results for input(s): HGBA1C in the last 72 hours.  CBG: Recent Labs  Lab 12/03/19 1207 12/03/19 1728 12/03/19 2024 12/04/19 0817 12/04/19 1255  GLUCAP 327* 373* 338* 202* 334*    Lipid Profile: No results for input(s): CHOL, HDL, LDLCALC, TRIG, CHOLHDL, LDLDIRECT in the last 72 hours.  Anemia Panel: Recent Labs    12/02/19 0427  FERRITIN 814*  TIBC 271  IRON 21*    Recent Results (from the past 240 hour(s))  Blood Culture (routine x 2)     Status: None (Preliminary result)   Collection Time: 12/01/19 10:47 AM   Specimen: BLOOD  Result Value Ref Range Status   Specimen Description   Final    BLOOD LEFT ANTECUBITAL Performed at [facility], [address] [place], [place] [address]    Special Requests   Final    BOTTLES DRAWN AEROBIC AND ANAEROBIC Blood Culture results may not be optimal due to an inadequate volume of blood received in culture bottles Performed at [facility], [address] [place], [place] [address]    Culture   Final    NO GROWTH 2 DAYS Performed at [facility], [address], [place], [place] [address]    Report Status PENDING  Incomplete  Blood Culture (routine x 2)     Status: None (Preliminary result)   Collection Time: 12/01/19 10:47 AM   Specimen: BLOOD  Result Value Ref Range Status   Specimen Description   Final    BLOOD RIGHT ANTECUBITAL Performed at [facility], [address] [place], [place] [address]    Special Requests   Final    BOTTLES DRAWN AEROBIC AND ANAEROBIC Blood Culture results may not be optimal due to an inadequate volume of blood received in culture bottles Performed at [facility], [address] [place], [place] [address]    Culture   Final    NO GROWTH 2 DAYS Performed at [facility],  [address], [place], [place] [address]    Report Status PENDING  Incomplete      Radiology Studies: ECHOCARDIOGRAM COMPLETE  Result Date: 12/04/2019    ECHOCARDIOGRAM REPORT   Patient Name:   NADEEM ROMANOSKI Date of Exam: 12/04/2019 Medical Rec #:  [id number]       Height:       69.0 in Accession #:    [id number]      Weight:       235.0 lb Date of Birth:  [birth year]       BSA:          2.213 m Patient Age:    34 years        BP:           134/95 mmHg Patient Gender: M               HR:           97 bpm. Exam Location:  Inpatient Procedure: 2D Echo Indications:    CHF 428  History:        Patient has no prior history of Echocardiogram examinations.                 Risk Factors:Diabetes. Covid +.  Sonographer:    Celene Skeen RDCS (AE) Referring Phys: [id number] Karen Huhta K [facility]  Sonographer Comments: Image acquisition challenging due to patient body habitus. IMPRESSIONS  1. Left ventricular ejection fraction, by estimation, is 60 to 65%. The left ventricle has normal function. The left ventricle has no regional wall motion abnormalities. There is mild left ventricular hypertrophy. Left ventricular diastolic parameters were normal.  2. Right ventricular systolic function is normal. The right ventricular size is normal.  3. Left atrial size was mildly dilated.  4. The mitral valve is normal in structure. No evidence of mitral valve regurgitation. No evidence of mitral stenosis.  5. The aortic valve was not well visualized. Aortic valve regurgitation is trivial. No aortic stenosis is present.  6. Aortic sinuses appear dilated in some views but only measure 3.6 cm . Aortic dilatation noted.  7. The inferior vena cava is normal in size with greater than 50% respiratory variability, suggesting right atrial pressure of 3 mmHg. FINDINGS  Left Ventricle: Left ventricular ejection fraction, by estimation, is 60 to 65%. The left ventricle has normal function. The left ventricle has no regional wall motion abnormalities. The  left ventricular internal cavity size was normal in size. There is  mild left ventricular hypertrophy. Left ventricular diastolic parameters were normal. Right Ventricle: The right ventricular size is normal. No increase in right ventricular wall thickness. Right ventricular systolic function is normal. Left Atrium: Left atrial size was mildly dilated. Right Atrium: Right atrial size was normal in size. Pericardium: There is no evidence of pericardial effusion. Mitral Valve: The mitral valve is normal in structure. No evidence of mitral valve regurgitation. No evidence of mitral valve stenosis. Tricuspid Valve: The tricuspid valve is normal in structure. Tricuspid valve regurgitation is not demonstrated. No evidence of tricuspid stenosis. Aortic Valve: The aortic valve was not well visualized. Aortic valve regurgitation is trivial. No aortic stenosis is present. Pulmonic Valve: The pulmonic valve was normal in structure. Pulmonic valve regurgitation is not visualized. No evidence of pulmonic stenosis. Aorta: Aortic sinuses appear dilated in some views but only measure 3.6  cm. The aortic root is normal in size and structure and aortic dilatation noted. Venous: The inferior vena cava is normal in size with greater than 50% respiratory variability, suggesting right atrial pressure of 3 mmHg. IAS/Shunts: No atrial level shunt detected by color flow Doppler.  LEFT VENTRICLE PLAX 2D LVIDd:         4.40 cm  Diastology LVIDs:         2.90 cm  LV e' lateral:   12.10 cm/s LV PW:         1.00 cm  LV E/e' lateral: 8.3 LV IVS:        1.20 cm LVOT diam:     2.20 cm LV SV:         71 LV SV Index:   32 LVOT Area:     3.80 cm  LEFT ATRIUM           Index LA diam:      3.80 cm 1.72 cm/m LA Vol (A2C): 36.4 ml 16.45 ml/m  AORTIC VALVE LVOT Vmax:   89.40 cm/s LVOT Vmean:  75.700 cm/s LVOT VTI:    0.188 m  AORTA Ao Root diam: 3.60 cm MITRAL VALVE MV Area (PHT): 2.54 cm     SHUNTS MV Decel Time: 299 msec     Systemic VTI:  0.19 m MV E  velocity: 101.00 cm/s  Systemic Diam: 2.20 cm Charlton HawsPeter Nishan MD Electronically signed by Charlton HawsPeter Nishan MD Signature Date/Time: 12/04/2019/11:48:36 AM    Final        LOS: 3 days   Hollice EspySendil K Keiana Tavella  [facility] Pager on [email or web address]  12/04/2019, 1:16 PM

## 2019-12-04 NOTE — Progress Notes (Signed)
°  Echocardiogram 2D Echocardiogram has been performed.  Aaron Woods 12/04/2019, 11:41 AM

## 2019-12-05 LAB — CBC WITH DIFFERENTIAL/PLATELET
Abs Immature Granulocytes: 0.4 10*3/uL — ABNORMAL HIGH (ref 0.00–0.07)
Basophils Absolute: 0 10*3/uL (ref 0.0–0.1)
Basophils Relative: 0 %
Eosinophils Absolute: 0 10*3/uL (ref 0.0–0.5)
Eosinophils Relative: 0 %
HCT: 40 % (ref 39.0–52.0)
Hemoglobin: 12.3 g/dL — ABNORMAL LOW (ref 13.0–17.0)
Immature Granulocytes: 3 %
Lymphocytes Relative: 12 %
Lymphs Abs: 1.5 10*3/uL (ref 0.7–4.0)
MCH: 24.1 pg — ABNORMAL LOW (ref 26.0–34.0)
MCHC: 30.8 g/dL (ref 30.0–36.0)
MCV: 78.3 fL — ABNORMAL LOW (ref 80.0–100.0)
Monocytes Absolute: 0.9 10*3/uL (ref 0.1–1.0)
Monocytes Relative: 7 %
Neutro Abs: 9.5 10*3/uL — ABNORMAL HIGH (ref 1.7–7.7)
Neutrophils Relative %: 78 %
Platelets: 469 10*3/uL — ABNORMAL HIGH (ref 150–400)
RBC: 5.11 MIL/uL (ref 4.22–5.81)
RDW: 14.2 % (ref 11.5–15.5)
WBC: 12.3 10*3/uL — ABNORMAL HIGH (ref 4.0–10.5)
nRBC: 0.3 % — ABNORMAL HIGH (ref 0.0–0.2)

## 2019-12-05 LAB — COMPREHENSIVE METABOLIC PANEL
ALT: 81 U/L — ABNORMAL HIGH (ref 0–44)
AST: 65 U/L — ABNORMAL HIGH (ref 15–41)
Albumin: 2.9 g/dL — ABNORMAL LOW (ref 3.5–5.0)
Alkaline Phosphatase: 98 U/L (ref 38–126)
Anion gap: 12 (ref 5–15)
BUN: 25 mg/dL — ABNORMAL HIGH (ref 6–20)
CO2: 32 mmol/L (ref 22–32)
Calcium: 8.7 mg/dL — ABNORMAL LOW (ref 8.9–10.3)
Chloride: 94 mmol/L — ABNORMAL LOW (ref 98–111)
Creatinine, Ser: 0.85 mg/dL (ref 0.61–1.24)
GFR calc Af Amer: >60 mL/min (ref >60)
GFR calc non Af Amer: >60 mL/min (ref >60)
Glucose, Bld: 277 mg/dL — ABNORMAL HIGH (ref 70–99)
Potassium: 4.6 mmol/L (ref 3.5–5.1)
Sodium: 138 mmol/L (ref 135–145)
Total Bilirubin: 0.4 mg/dL (ref 0.3–1.2)
Total Protein: 6.4 g/dL — ABNORMAL LOW (ref 6.5–8.1)

## 2019-12-05 LAB — D-DIMER, QUANTITATIVE: D-Dimer, Quant: 2.24 ug/mL-FEU — ABNORMAL HIGH (ref 0.00–0.50)

## 2019-12-05 LAB — GLUCOSE, CAPILLARY
Glucose-Capillary: 262 mg/dL — ABNORMAL HIGH (ref 70–99)
Glucose-Capillary: 329 mg/dL — ABNORMAL HIGH (ref 70–99)
Glucose-Capillary: 352 mg/dL — ABNORMAL HIGH (ref 70–99)
Glucose-Capillary: 384 mg/dL — ABNORMAL HIGH (ref 70–99)

## 2019-12-05 LAB — C-REACTIVE PROTEIN: CRP: 4.9 mg/dL — ABNORMAL HIGH (ref <1.0)

## 2019-12-05 MED ORDER — TRAZODONE HCL 50 MG PO TABS
50.0000 mg | ORAL_TABLET | Freq: Every day | ORAL | Status: DC
  Administered 2019-12-05 – 2019-12-07 (×3): 50 mg via ORAL
  Filled 2019-12-05 (×3): qty 1

## 2019-12-05 MED ORDER — MELATONIN 3 MG PO TABS
6.0000 mg | ORAL_TABLET | Freq: Every evening | ORAL | Status: DC | PRN
  Administered 2019-12-05 – 2019-12-07 (×2): 6 mg via ORAL
  Filled 2019-12-05 (×2): qty 2

## 2019-12-05 MED ORDER — INSULIN ASPART 100 UNIT/ML ~~LOC~~ SOLN
12.0000 [IU] | Freq: Three times a day (TID) | SUBCUTANEOUS | Status: DC
  Administered 2019-12-05 – 2019-12-06 (×4): 12 [IU] via SUBCUTANEOUS

## 2019-12-05 MED ORDER — INSULIN DETEMIR 100 UNIT/ML ~~LOC~~ SOLN
44.0000 [IU] | Freq: Two times a day (BID) | SUBCUTANEOUS | Status: DC
  Administered 2019-12-05 (×2): 44 [IU] via SUBCUTANEOUS
  Filled 2019-12-05 (×3): qty 0.44

## 2019-12-05 MED ORDER — FLUTICASONE PROPIONATE 50 MCG/ACT NA SUSP
2.0000 | Freq: Every day | NASAL | Status: DC
  Administered 2019-12-05 – 2019-12-08 (×4): 2 via NASAL
  Filled 2019-12-05: qty 16

## 2019-12-05 MED ORDER — LINAGLIPTIN 5 MG PO TABS
5.0000 mg | ORAL_TABLET | Freq: Every day | ORAL | Status: DC
  Administered 2019-12-05 – 2019-12-08 (×4): 5 mg via ORAL
  Filled 2019-12-05 (×4): qty 1

## 2019-12-05 MED ORDER — LINAGLIPTIN 5 MG PO TABS: 5.0000 mg | ORAL_TABLET | Freq: Every day | ORAL | Status: DC

## 2019-12-05 NOTE — Progress Notes (Signed)
SATURATION QUALIFICATIONS: (This note is used to comply with regulatory documentation for home oxygen)  Patient Saturations on Room Air at Rest = 78%  Patient Saturations on Room Air while Ambulating = 74%  Patient Saturations on 5 Liters of oxygen while Ambulating = 82%  Please briefly explain why patient needs home oxygen:

## 2019-12-05 NOTE — Progress Notes (Signed)
PROGRESS NOTE    Aaron Woods  ZHG:[id number] DOB: 07/18/1985 DOA: 12/01/2019 PCP: Georgina Quint, MD    Brief Narrative:  Aaron Woods is a 34 year old with past medical history of insulin-dependent diabetes mellitus, hyperlipidemia, obesity presented to the emergency room on 9/9 with cough and shortness of breath.  Patient had recently tested positive for COVID-19 on 9/5.  He is unvaccinated.  Noted to be hypoxic with oxygen saturations in the 70s.  Admitted to the hospital service and started on Remdisivir.  Initially requiring 5 L.  Also noted to have mild volume overload with an elevated BNP of 277.  Patient given a dose of Lasix on 9/10 and had significant diuresis.    Assessment & Plan:   Principal Problem:   Pneumonia due to COVID-19 virus Active Problems:   Uncontrolled type 2 diabetes mellitus with hyperglycemia (HCC)   Acute respiratory failure with hypoxia (HCC)   Sepsis (HCC)   Microcytic anemia   Obesity (BMI 30.0-34.9)   Acute hypoxic respiratory failure secondary to acute Covid-19 viral pneumonia during the ongoing 2020/2021 Covid 19 Pandemic - POA Sepsis ruled out Patient presenting to the ED with cough and progressive shortness of breath.  Recently tested positive for Covid-19 on 11/27/2019, patient unvaccinated.  He was noted to be hypoxic with oxygen saturations in the 70s on arrival.  Chest x-ray notable for diffuse interstitial and airspace opacities consistent with multifocal/viral Covid-19 pneumonia. --COVID test: + 11/27/2019 --CRP 19.5>20.9>13.0>7.5>4.9 --ddimer 1.70>1.22> 0.90> 0.99>2.24  --Remdesivir, plan 5-day course (Day #5/5) --Baricitinib 4mg  PO daily (Day #5/14) --Continue Solumedrol 60mg  IV q12h --Lasix 40mg  IV q12h --prone for 2-3hrs every 12hrs if able --Continue supplemental oxygen, titrate to maintain SPO close as IA on Dr. : From [facility]: Updated on Sabien Umland earlier this morning he is doing good no he still  has some no shortness of breath feels, we can 2 greater than 88%, down to 5 L per nasal cannula. --Continue supportive care with albuterol MDI prn, vitamin C, zinc, Tylenol, antitussives (benzonatate/ Mucinex/Tussionex) --Follow CBC, CMP, D-dimer, ferritin, and CRP daily --strict I/O's --Continue airborne/contact isolation precautions for 3 weeks from the day of diagnosis  The treatment plan and use of medications and known side effects were discussed with patient/family. Some of the medications used are based on case reports/anecdotal data.  All other medications being used in the management of COVID-19 based on limited study data.  Complete risks and long-term side effects are unknown, however in the best clinical judgment they seem to be of some benefit.  Patient wanted to proceed with treatment options provided.  Type 2 diabetes mellitus Hemoglobin A1c 8.8 on 12/01/2019.  On Humalog 75/25 26 units BID, Victoza 1.2 mg subcutaneously daily, and Metformin 500 mg p.o. daily at home. --Levemir 44 units [place] BID --Novolog 12u [place] TIDAC --Tradjenta 5 mg p.o. daily --Insulin sliding scale for further coverage --CBGs QAC/HS  Obesity Body mass index is 34.7 kg/m.  Counseled regarding lifestyle changes/weight loss as this complicates all facets of care.    DVT prophylaxis: Lovenox Code Status: Full code Family Communication: Updated patient spouse, Tarah via telephone this afternoon  Disposition Plan:  Status is: Inpatient  Remains inpatient appropriate because:Ongoing diagnostic testing needed not appropriate for outpatient work up, Unsafe d/c plan, IV treatments appropriate due to intensity of illness or inability to take PO and Inpatient level of care appropriate due to severity of illness   Dispo:  Patient From: Home  Planned Disposition:  Home  Expected discharge date: 12/05/19  Medically stable for discharge: No    Consultants:   None  Procedures:   Transthoracic echocardiogram  9/12  Antimicrobials:   None  COVID 19 Therapeutics: Antibacterials: Procalcitonin 0.17.  Patient's WBC was normal.  Patient noted to be on antibacterials, but no clear indication for acute infection, so stopped on 9/10 Remdesivir: Day 5 Steroids: Solu-Medrol 60mg  IV q12h Diuretics: Lasix 40mg  IV q12h Baricitinib: Day 5 PUD Prophylaxis: Pepcid DVT Prophylaxis:  Lovenox    Subjective: Patient seen and examined bedside, resting comfortably.  Continues with significant shortness of breath especially with exertion.  Also with continued fatigue/weakness.  Oxygen now down to 4.5 L; from 10 L yesterday.  No other questions or concerns at this time.  Denies headache, no visual changes, no chest pain, no palpitations, no abdominal pain, no fever/chills/night sweats, no nausea/vomiting/diarrhea.  No acute events overnight per nursing staff.  Objective: Vitals:   12/05/19 0930 12/05/19 1030 12/05/19 1045 12/05/19 1116  BP:      Pulse:      Resp:      Temp:      TempSrc:      SpO2: 92% (!) 77% 90% (!) 87%  Weight:      Height:        Intake/Output Summary (Last 24 hours) at 12/05/2019 1154 Last data filed at 12/05/2019 0834 Gross per 24 hour  Intake 800 ml  Output 1525 ml  Net -725 ml   Filed Weights   12/01/19 0901  Weight: 106.6 kg    Examination:  General exam: Appears calm and comfortable  Respiratory system: Clear to auscultation. Respiratory effort normal.  On 5 L nasal cannula Cardiovascular system: S1 & S2 heard, RRR. No JVD, murmurs, rubs, gallops or clicks. No pedal edema. Gastrointestinal system: Abdomen is nondistended, soft and nontender. No organomegaly or masses felt. Normal bowel sounds heard. Central nervous system: Alert and oriented. No focal neurological deficits. Extremities: Symmetric 5 x 5 power. Skin: No rashes, lesions or ulcers Psychiatry: Judgement and insight appear normal. Mood & affect appropriate.     Data Reviewed: I have personally reviewed  following labs and imaging studies  CBC: Recent Labs  Lab 12/01/19 1047 12/02/19 0427 12/03/19 0528 12/04/19 0548 12/05/19 0447  WBC 5.2 4.2 13.2* 12.9* 12.3*  NEUTROABS 3.9 2.8 10.6* 10.6* 9.5*  HGB 12.2* 12.2* 12.6* 12.1* 12.3*  HCT 38.9* 40.6 41.7 40.1 40.0  MCV 77.8* 79.8* 80.5 78.8* 78.3*  PLT 272 278 363 455* 469*   Basic Metabolic Panel: Recent Labs  Lab 12/01/19 1047 12/02/19 0427 12/03/19 0528 12/04/19 0548 12/05/19 0447  NA 132* 136 138 138 138  K 4.0 4.4 4.8 4.5 4.6  CL 97* 99 99 96* 94*  CO2 26 23 26 30  32  GLUCOSE 357* 336* 245* 250* 277*  BUN 20 27* 28* 25* 25*  CREATININE 1.23 1.33* 1.02 0.94 0.85  CALCIUM 7.9* 8.4* 8.6* 8.4* 8.7*  MG  --  2.4  --   --   --   PHOS  --  3.6  --   --   --    GFR: Estimated Creatinine Clearance: 144.6 mL/min (by C-G formula based on SCr of 0.85 mg/dL). Liver Function Tests: Recent Labs  Lab 12/01/19 1047 12/02/19 0427 12/03/19 0528 12/04/19 0548 12/05/19 0447  AST 67* 72* 78* 81* 65*  ALT 60* 64* 65* 76* 81*  ALKPHOS 82 71 78 88 98  BILITOT 0.3 0.3 0.3 0.3 0.4  PROT 6.8 6.7 6.4* 6.7 6.4*  ALBUMIN 3.1* 2.9* 2.9* 2.8* 2.9*   No results for input(s): LIPASE, AMYLASE in the last 168 hours. No results for input(s): AMMONIA in the last 168 hours. Coagulation Profile: No results for input(s): INR, PROTIME in the last 168 hours. Cardiac Enzymes: No results for input(s): CKTOTAL, CKMB, CKMBINDEX, TROPONINI in the last 168 hours. BNP (last 3 results) No results for input(s): PROBNP in the last 8760 hours. HbA1C: No results for input(s): HGBA1C in the last 72 hours. CBG: Recent Labs  Lab 12/04/19 1255 12/04/19 1613 12/04/19 2225 12/05/19 0828 12/05/19 1123  GLUCAP 334* 340* 284* 262* 329*   Lipid Profile: No results for input(s): CHOL, HDL, LDLCALC, TRIG, CHOLHDL, LDLDIRECT in the last 72 hours. Thyroid Function Tests: No results for input(s): TSH, T4TOTAL, FREET4, T3FREE, THYROIDAB in the last 72  hours. Anemia Panel: No results for input(s): VITAMINB12, FOLATE, FERRITIN, TIBC, IRON, RETICCTPCT in the last 72 hours. Sepsis Labs: Recent Labs  Lab 12/01/19 1047  PROCALCITON 0.17  LATICACIDVEN 1.4    Recent Results (from the past 240 hour(s))  Blood Culture (routine x 2)     Status: None (Preliminary result)   Collection Time: 12/01/19 10:47 AM   Specimen: BLOOD  Result Value Ref Range Status   Specimen Description   Final    BLOOD LEFT ANTECUBITAL Performed at [facility], [address] [place], [place] [address]    Special Requests   Final    BOTTLES DRAWN AEROBIC AND ANAEROBIC Blood Culture results may not be optimal due to an inadequate volume of blood received in culture bottles Performed at [facility], [address], [place], [place] [address]    Culture   Final    NO GROWTH 3 DAYS Performed at [facility], [address] [place], [place] [address]    Report Status PENDING  Incomplete  Blood Culture (routine x 2)     Status: None (Preliminary result)   Collection Time: 12/01/19 10:47 AM   Specimen: BLOOD  Result Value Ref Range Status   Specimen Description   Final    BLOOD RIGHT ANTECUBITAL Performed at [facility], [address], [place], [place] [address]    Special Requests   Final    BOTTLES DRAWN AEROBIC AND ANAEROBIC Blood Culture results may not be optimal due to an inadequate volume of blood received in culture bottles Performed at [facility] [address] [place], [place] [address]    Culture   Final    NO GROWTH 3 DAYS Performed at [facility], [address], [place], [place] [address]    Report Status PENDING  Incomplete         Radiology Studies: ECHOCARDIOGRAM COMPLETE  Result Date: 12/04/2019    ECHOCARDIOGRAM REPORT   Patient Name:   CLEO VILLAMIZAR Date of Exam: 12/04/2019 Medical Rec #:  [id number]       Height:       69.0 in  Accession #:    [id number]      Weight:       235.0 lb Date of Birth:  December 29, 1985       BSA:          2.213 m Patient Age:    34 years        BP:           134/95 mmHg Patient Gender: M  HR:           97 bpm. Exam Location:  Inpatient Procedure: 2D Echo Indications:    CHF 428  History:        Patient has no prior history of Echocardiogram examinations.                 Risk Factors:Diabetes. Covid +.  Sonographer:    Celene Skeen RDCS (AE) Referring Phys: [id number] SENDIL K [facility]  Sonographer Comments: Image acquisition challenging due to patient body habitus. IMPRESSIONS  1. Left ventricular ejection fraction, by estimation, is 60 to 65%. The left ventricle has normal function. The left ventricle has no regional wall motion abnormalities. There is mild left ventricular hypertrophy. Left ventricular diastolic parameters were normal.  2. Right ventricular systolic function is normal. The right ventricular size is normal.  3. Left atrial size was mildly dilated.  4. The mitral valve is normal in structure. No evidence of mitral valve regurgitation. No evidence of mitral stenosis.  5. The aortic valve was not well visualized. Aortic valve regurgitation is trivial. No aortic stenosis is present.  6. Aortic sinuses appear dilated in some views but only measure 3.6 cm . Aortic dilatation noted.  7. The inferior vena cava is normal in size with greater than 50% respiratory variability, suggesting right atrial pressure of 3 mmHg. FINDINGS  Left Ventricle: Left ventricular ejection fraction, by estimation, is 60 to 65%. The left ventricle has normal function. The left ventricle has no regional wall motion abnormalities. The left ventricular internal cavity size was normal in size. There is  mild left ventricular hypertrophy. Left ventricular diastolic parameters were normal. Right Ventricle: The right ventricular size is normal. No increase in right ventricular wall thickness. Right ventricular systolic function  is normal. Left Atrium: Left atrial size was mildly dilated. Right Atrium: Right atrial size was normal in size. Pericardium: There is no evidence of pericardial effusion. Mitral Valve: The mitral valve is normal in structure. No evidence of mitral valve regurgitation. No evidence of mitral valve stenosis. Tricuspid Valve: The tricuspid valve is normal in structure. Tricuspid valve regurgitation is not demonstrated. No evidence of tricuspid stenosis. Aortic Valve: The aortic valve was not well visualized. Aortic valve regurgitation is trivial. No aortic stenosis is present. Pulmonic Valve: The pulmonic valve was normal in structure. Pulmonic valve regurgitation is not visualized. No evidence of pulmonic stenosis. Aorta: Aortic sinuses appear dilated in some views but only measure 3.6 cm. The aortic root is normal in size and structure and aortic dilatation noted. Venous: The inferior vena cava is normal in size with greater than 50% respiratory variability, suggesting right atrial pressure of 3 mmHg. IAS/Shunts: No atrial level shunt detected by color flow Doppler.  LEFT VENTRICLE PLAX 2D LVIDd:         4.40 cm  Diastology LVIDs:         2.90 cm  LV e' lateral:   12.10 cm/s LV PW:         1.00 cm  LV E/e' lateral: 8.3 LV IVS:        1.20 cm LVOT diam:     2.20 cm LV SV:         71 LV SV Index:   32 LVOT Area:     3.80 cm  LEFT ATRIUM           Index LA diam:      3.80 cm 1.72 cm/m LA Vol (A2C): 36.4 ml 16.45 ml/m  AORTIC  VALVE LVOT Vmax:   89.40 cm/s LVOT Vmean:  75.700 cm/s LVOT VTI:    0.188 m  AORTA Ao Root diam: 3.60 cm MITRAL VALVE MV Area (PHT): 2.54 cm     SHUNTS MV Decel Time: 299 msec     Systemic VTI:  0.19 m MV E velocity: 101.00 cm/s  Systemic Diam: 2.20 cm Charlton Haws MD Electronically signed by Charlton Haws MD Signature Date/Time: 12/04/2019/11:48:36 AM    Final         Scheduled Meds: . albuterol  2 puff Inhalation Q6H  . vitamin C  500 mg Oral Daily  . baricitinib  4 mg Oral Daily  .  enoxaparin (LOVENOX) injection  55 mg Subcutaneous Q24H  . famotidine  20 mg Oral BID  . fluticasone  2 spray Each Nare Daily  . furosemide  40 mg Intravenous BID  . insulin aspart  0-20 Units Subcutaneous TID WC  . insulin aspart  12 Units Subcutaneous TID WC  . insulin detemir  44 Units Subcutaneous BID  . linagliptin  5 mg Oral Daily  . mouth rinse  15 mL Mouth Rinse BID  . methylPREDNISolone (SOLU-MEDROL) injection  60 mg Intravenous Q12H  . sodium chloride flush  3 mL Intravenous Q12H  . traZODone  50 mg Oral QHS  . zinc sulfate  220 mg Oral Daily   Continuous Infusions:   LOS: 4 days    Time spent: 36 minutes spent on chart review, discussion with nursing staff, consultants, updating family and interview/physical exam; more than 50% of that time was spent in counseling and/or coordination of care.    Alvira Philips [place], DO [facility] Available via Epic secure chat 7am-7pm After these hours, please refer to coverage provider listed on [email or web address] 12/05/2019, 11:54 AM

## 2019-12-06 LAB — CBC WITH DIFFERENTIAL/PLATELET
Abs Immature Granulocytes: 0.29 10*3/uL — ABNORMAL HIGH (ref 0.00–0.07)
Basophils Absolute: 0 10*3/uL (ref 0.0–0.1)
Basophils Relative: 0 %
Eosinophils Absolute: 0 10*3/uL (ref 0.0–0.5)
Eosinophils Relative: 0 %
HCT: 39.7 % (ref 39.0–52.0)
Hemoglobin: 12.4 g/dL — ABNORMAL LOW (ref 13.0–17.0)
Immature Granulocytes: 3 %
Lymphocytes Relative: 13 %
Lymphs Abs: 1.4 10*3/uL (ref 0.7–4.0)
MCH: 24.3 pg — ABNORMAL LOW (ref 26.0–34.0)
MCHC: 31.2 g/dL (ref 30.0–36.0)
MCV: 77.7 fL — ABNORMAL LOW (ref 80.0–100.0)
Monocytes Absolute: 0.9 10*3/uL (ref 0.1–1.0)
Monocytes Relative: 9 %
Neutro Abs: 8.2 10*3/uL — ABNORMAL HIGH (ref 1.7–7.7)
Neutrophils Relative %: 75 %
Platelets: 482 10*3/uL — ABNORMAL HIGH (ref 150–400)
RBC: 5.11 MIL/uL (ref 4.22–5.81)
RDW: 13.8 % (ref 11.5–15.5)
WBC: 10.9 10*3/uL — ABNORMAL HIGH (ref 4.0–10.5)
nRBC: 1 % — ABNORMAL HIGH (ref 0.0–0.2)

## 2019-12-06 LAB — COMPREHENSIVE METABOLIC PANEL
ALT: 74 U/L — ABNORMAL HIGH (ref 0–44)
AST: 41 U/L (ref 15–41)
Albumin: 2.7 g/dL — ABNORMAL LOW (ref 3.5–5.0)
Alkaline Phosphatase: 93 U/L (ref 38–126)
Anion gap: 11 (ref 5–15)
BUN: 26 mg/dL — ABNORMAL HIGH (ref 6–20)
CO2: 32 mmol/L (ref 22–32)
Calcium: 8.7 mg/dL — ABNORMAL LOW (ref 8.9–10.3)
Chloride: 94 mmol/L — ABNORMAL LOW (ref 98–111)
Creatinine, Ser: 0.92 mg/dL (ref 0.61–1.24)
GFR calc Af Amer: >60 mL/min (ref >60)
GFR calc non Af Amer: >60 mL/min (ref >60)
Glucose, Bld: 276 mg/dL — ABNORMAL HIGH (ref 70–99)
Potassium: 4.2 mmol/L (ref 3.5–5.1)
Sodium: 137 mmol/L (ref 135–145)
Total Bilirubin: 0.4 mg/dL (ref 0.3–1.2)
Total Protein: 6.1 g/dL — ABNORMAL LOW (ref 6.5–8.1)

## 2019-12-06 LAB — CULTURE, BLOOD (ROUTINE X 2)
Culture: NO GROWTH
Culture: NO GROWTH

## 2019-12-06 LAB — GLUCOSE, CAPILLARY
Glucose-Capillary: 278 mg/dL — ABNORMAL HIGH (ref 70–99)
Glucose-Capillary: 308 mg/dL — ABNORMAL HIGH (ref 70–99)
Glucose-Capillary: 362 mg/dL — ABNORMAL HIGH (ref 70–99)
Glucose-Capillary: 464 mg/dL — ABNORMAL HIGH (ref 70–99)

## 2019-12-06 LAB — D-DIMER, QUANTITATIVE: D-Dimer, Quant: 6.59 ug/mL-FEU — ABNORMAL HIGH (ref 0.00–0.50)

## 2019-12-06 LAB — C-REACTIVE PROTEIN: CRP: 2.7 mg/dL — ABNORMAL HIGH (ref <1.0)

## 2019-12-06 MED ORDER — INSULIN ASPART 100 UNIT/ML ~~LOC~~ SOLN
16.0000 [IU] | Freq: Three times a day (TID) | SUBCUTANEOUS | Status: DC
  Administered 2019-12-06 – 2019-12-07 (×5): 16 [IU] via SUBCUTANEOUS

## 2019-12-06 MED ORDER — WHITE PETROLATUM EX OINT
TOPICAL_OINTMENT | CUTANEOUS | Status: DC | PRN
  Administered 2019-12-06: 1 via TOPICAL
  Filled 2019-12-06 (×2): qty 5

## 2019-12-06 MED ORDER — INSULIN DETEMIR 100 UNIT/ML ~~LOC~~ SOLN
50.0000 [IU] | Freq: Two times a day (BID) | SUBCUTANEOUS | Status: DC
  Administered 2019-12-06 – 2019-12-07 (×3): 50 [IU] via SUBCUTANEOUS
  Filled 2019-12-06 (×4): qty 0.5

## 2019-12-06 NOTE — Progress Notes (Signed)
PROGRESS NOTE    DAMAN STEFFENHAGEN  IWO:[id number] DOB: 17-Oct-1985 DOA: 12/01/2019 PCP: Georgina Quint, MD    Brief Narrative:  AMMAAR ENCINA is a 34 year old with past medical history of insulin-dependent diabetes mellitus, hyperlipidemia, obesity presented to the emergency room on 9/9 with cough and shortness of breath.  Patient had recently tested positive for COVID-19 on 9/5.  He is unvaccinated.  Noted to be hypoxic with oxygen saturations in the 70s.  Admitted to the hospital service and started on Remdisivir.   Assessment & Plan:   Principal Problem:   Pneumonia due to COVID-19 virus Active Problems:   Uncontrolled type 2 diabetes mellitus with hyperglycemia (HCC)   Acute respiratory failure with hypoxia (HCC)   Sepsis (HCC)   Microcytic anemia   Obesity (BMI 30.0-34.9)   Acute hypoxic respiratory failure secondary to acute Covid-19 viral pneumonia during the ongoing 2020/2021 Covid 19 Pandemic - POA Sepsis ruled out Patient presenting to the ED with cough and progressive shortness of breath.  Recently tested positive for Covid-19 on 11/27/2019, patient unvaccinated.  He was noted to be hypoxic with oxygen saturations in the 70s on arrival.  Chest x-ray notable for diffuse interstitial and airspace opacities consistent with multifocal/viral Covid-19 pneumonia. --COVID test: + 11/27/2019 --CRP 19.5>20.9>13.0>7.5>4.9>2.7 --ddimer 1.70>1.22> 0.90> 0.99>2.24>6.59  --completed 5-day course of remdesivir on 12/05/2019 --Baricitinib 4mg  PO daily (Day #6/14) --Continue Solumedrol 60mg  IV q12h --Lasix 40mg  IV q12h; net negative 2.5L past 24h and 3.6L since admission --prone for 2-3hrs every 12hrs if able --Continue supplemental oxygen, titrate to maintain SPO2 greater than 92%, currently on 5L [place] --Continue supportive care with albuterol MDI prn, vitamin C, zinc, Tylenol, antitussives (benzonatate/ Mucinex/Tussionex) --Follow CBC, CMP, D-dimer, ferritin, and CRP daily --strict  I/O's --Continue airborne/contact isolation precautions for 3 weeks from the day of diagnosis  The treatment plan and use of medications and known side effects were discussed with patient/family. Some of the medications used are based on case reports/anecdotal data.  All other medications being used in the management of COVID-19 based on limited study data.  Complete risks and long-term side effects are unknown, however in the best clinical judgment they seem to be of some benefit.  Patient wanted to proceed with treatment options provided.  Type 2 diabetes mellitus Hemoglobin A1c 8.8 on 12/01/2019.  On Humalog 75/25 26 units BID, Victoza 1.2 mg subcutaneously daily, and Metformin 500 mg p.o. daily at home. --Increase Levemir to 50 units [place] BID --Increase Novolog to 16u [place] TIDAC --Tradjenta 5 mg p.o. daily --Insulin sliding scale for further coverage --CBGs QAC/HS  Obesity Body mass index is 34.7 kg/m.  Counseled regarding lifestyle changes/weight loss as this complicates all facets of care.    DVT prophylaxis: Lovenox Code Status: Full code Family Communication: Updated patient spouse, Tarah via telephone this afternoon  Disposition Plan:  Status is: Inpatient  Remains inpatient appropriate because:Ongoing diagnostic testing needed not appropriate for outpatient work up, Unsafe d/c plan, IV treatments appropriate due to intensity of illness or inability to take PO and Inpatient level of care appropriate due to severity of illness   Dispo:  Patient From: Home  Planned Disposition: Home  Expected discharge date: 12/07/19  Medically stable for discharge: No    Consultants:   None  Procedures:   Transthoracic echocardiogram 9/12  Antimicrobials:   None   Subjective: Patient seen and examined bedside, resting comfortably.  Continues with significant shortness of breath especially with exertion.  Also with continued fatigue/weakness.  Continues on 5 L nasal cannula at  rest. No other questions or concerns at this time.  Denies headache, no visual changes, no chest pain, no palpitations, no abdominal pain, no fever/chills/night sweats, no nausea/vomiting/diarrhea.  No acute events overnight per nursing staff.  Objective: Vitals:   12/05/19 1434 12/05/19 1833 12/05/19 2140 12/06/19 0531  BP:   (!) 141/95 115/78  Pulse:   99 (!) 103  Resp:   (!) 22 20  Temp:   98.4 F (36.9 C) 98.4 F (36.9 C)  TempSrc:   Oral Oral  SpO2: 96% 90% (!) 80% (!) 81%  Weight:      Height:        Intake/Output Summary (Last 24 hours) at 12/06/2019 1104 Last data filed at 12/06/2019 0500 Gross per 24 hour  Intake 3 ml  Output 2175 ml  Net -2172 ml   Filed Weights   12/01/19 0901  Weight: 106.6 kg    Examination:  General exam: Appears calm and comfortable  Respiratory system: Clear to auscultation. Respiratory effort normal.  On 5 L nasal cannula Cardiovascular system: S1 & S2 heard, RRR. No JVD, murmurs, rubs, gallops or clicks. No pedal edema. Gastrointestinal system: Abdomen is nondistended, soft and nontender. No organomegaly or masses felt. Normal bowel sounds heard. Central nervous system: Alert and oriented. No focal neurological deficits. Extremities: Symmetric 5 x 5 power. Skin: No rashes, lesions or ulcers Psychiatry: Judgement and insight appear normal. Mood & affect appropriate.     Data Reviewed: I have personally reviewed following labs and imaging studies  CBC: Recent Labs  Lab 12/02/19 0427 12/03/19 0528 12/04/19 0548 12/05/19 0447 12/06/19 0444  WBC 4.2 13.2* 12.9* 12.3* 10.9*  NEUTROABS 2.8 10.6* 10.6* 9.5* 8.2*  HGB 12.2* 12.6* 12.1* 12.3* 12.4*  HCT 40.6 41.7 40.1 40.0 39.7  MCV 79.8* 80.5 78.8* 78.3* 77.7*  PLT 278 363 455* 469* 482*   Basic Metabolic Panel: Recent Labs  Lab 12/02/19 0427 12/03/19 0528 12/04/19 0548 12/05/19 0447 12/06/19 0444  NA 136 138 138 138 137  K 4.4 4.8 4.5 4.6 4.2  CL 99 99 96* 94* 94*  CO2 23  26 30  32 32  GLUCOSE 336* 245* 250* 277* 276*  BUN 27* 28* 25* 25* 26*  CREATININE 1.33* 1.02 0.94 0.85 0.92  CALCIUM 8.4* 8.6* 8.4* 8.7* 8.7*  MG 2.4  --   --   --   --   PHOS 3.6  --   --   --   --    GFR: Estimated Creatinine Clearance: 133.6 mL/min (by C-G formula based on SCr of 0.92 mg/dL). Liver Function Tests: Recent Labs  Lab 12/02/19 0427 12/03/19 0528 12/04/19 0548 12/05/19 0447 12/06/19 0444  AST 72* 78* 81* 65* 41  ALT 64* 65* 76* 81* 74*  ALKPHOS 71 78 88 98 93  BILITOT 0.3 0.3 0.3 0.4 0.4  PROT 6.7 6.4* 6.7 6.4* 6.1*  ALBUMIN 2.9* 2.9* 2.8* 2.9* 2.7*   No results for input(s): LIPASE, AMYLASE in the last 168 hours. No results for input(s): AMMONIA in the last 168 hours. Coagulation Profile: No results for input(s): INR, PROTIME in the last 168 hours. Cardiac Enzymes: No results for input(s): CKTOTAL, CKMB, CKMBINDEX, TROPONINI in the last 168 hours. BNP (last 3 results) No results for input(s): PROBNP in the last 8760 hours. HbA1C: No results for input(s): HGBA1C in the last 72 hours. CBG: Recent Labs  Lab 12/05/19 0828 12/05/19 1123 12/05/19 1719 12/05/19 2142 12/06/19 0751  GLUCAP 262* 329* 352* 384* 278*   Lipid Profile: No results for input(s): CHOL, HDL, LDLCALC, TRIG, CHOLHDL, LDLDIRECT in the last 72 hours. Thyroid Function Tests: No results for input(s): TSH, T4TOTAL, FREET4, T3FREE, THYROIDAB in the last 72 hours. Anemia Panel: No results for input(s): VITAMINB12, FOLATE, FERRITIN, TIBC, IRON, RETICCTPCT in the last 72 hours. Sepsis Labs: Recent Labs  Lab 12/01/19 1047  PROCALCITON 0.17  LATICACIDVEN 1.4    Recent Results (from the past 240 hour(s))  Blood Culture (routine x 2)     Status: None   Collection Time: 12/01/19 10:47 AM   Specimen: BLOOD  Result Value Ref Range Status   Specimen Description   Final    BLOOD LEFT ANTECUBITAL Performed at [facility], [address] [place], [place] [address]     Special Requests   Final    BOTTLES DRAWN AEROBIC AND ANAEROBIC Blood Culture results may not be optimal due to an inadequate volume of blood received in culture bottles Performed at [facility], [address] [place], [place] [address]    Culture   Final    NO GROWTH 5 DAYS Performed at [facility], [address] [place], [place] [address]    Report Status 12/06/2019 FINAL  Final  Blood Culture (routine x 2)     Status: None   Collection Time: 12/01/19 10:47 AM   Specimen: BLOOD  Result Value Ref Range Status   Specimen Description   Final    BLOOD RIGHT ANTECUBITAL Performed at [facility], [address] [place], [place] [address]    Special Requests   Final    BOTTLES DRAWN AEROBIC AND ANAEROBIC Blood Culture results may not be optimal due to an inadequate volume of blood received in culture bottles Performed at [facility], [address], [place], [place] [address]    Culture   Final    NO GROWTH 5 DAYS Performed at [facility], [address] [place], [place] [address]    Report Status 12/06/2019 FINAL  Final         Radiology Studies: ECHOCARDIOGRAM COMPLETE  Result Date: 12/04/2019    ECHOCARDIOGRAM REPORT   Patient Name:   DARUS HERSHMAN Date of Exam: 12/04/2019 Medical Rec #:  [id number]       Height:       69.0 in Accession #:    [id number]      Weight:       235.0 lb Date of Birth:  1985-06-17       BSA:          2.213 m Patient Age:    34 years        BP:           134/95 mmHg Patient Gender: M               HR:           97 bpm. Exam Location:  Inpatient Procedure: 2D Echo Indications:    CHF 428  History:        Patient has no prior history of Echocardiogram examinations.                 Risk Factors:Diabetes. Covid +.  Sonographer:    Celene Skeen RDCS (AE) Referring Phys: [id number] SENDIL K [facility]  Sonographer Comments: Image acquisition challenging due to patient body habitus.  IMPRESSIONS  1. Left ventricular ejection fraction, by estimation,  is 60 to 65%. The left ventricle has normal function. The left ventricle has no regional wall motion abnormalities. There is mild left ventricular hypertrophy. Left ventricular diastolic parameters were normal.  2. Right ventricular systolic function is normal. The right ventricular size is normal.  3. Left atrial size was mildly dilated.  4. The mitral valve is normal in structure. No evidence of mitral valve regurgitation. No evidence of mitral stenosis.  5. The aortic valve was not well visualized. Aortic valve regurgitation is trivial. No aortic stenosis is present.  6. Aortic sinuses appear dilated in some views but only measure 3.6 cm . Aortic dilatation noted.  7. The inferior vena cava is normal in size with greater than 50% respiratory variability, suggesting right atrial pressure of 3 mmHg. FINDINGS  Left Ventricle: Left ventricular ejection fraction, by estimation, is 60 to 65%. The left ventricle has normal function. The left ventricle has no regional wall motion abnormalities. The left ventricular internal cavity size was normal in size. There is  mild left ventricular hypertrophy. Left ventricular diastolic parameters were normal. Right Ventricle: The right ventricular size is normal. No increase in right ventricular wall thickness. Right ventricular systolic function is normal. Left Atrium: Left atrial size was mildly dilated. Right Atrium: Right atrial size was normal in size. Pericardium: There is no evidence of pericardial effusion. Mitral Valve: The mitral valve is normal in structure. No evidence of mitral valve regurgitation. No evidence of mitral valve stenosis. Tricuspid Valve: The tricuspid valve is normal in structure. Tricuspid valve regurgitation is not demonstrated. No evidence of tricuspid stenosis. Aortic Valve: The aortic valve was not well visualized. Aortic valve regurgitation is trivial. No aortic stenosis is  present. Pulmonic Valve: The pulmonic valve was normal in structure. Pulmonic valve regurgitation is not visualized. No evidence of pulmonic stenosis. Aorta: Aortic sinuses appear dilated in some views but only measure 3.6 cm. The aortic root is normal in size and structure and aortic dilatation noted. Venous: The inferior vena cava is normal in size with greater than 50% respiratory variability, suggesting right atrial pressure of 3 mmHg. IAS/Shunts: No atrial level shunt detected by color flow Doppler.  LEFT VENTRICLE PLAX 2D LVIDd:         4.40 cm  Diastology LVIDs:         2.90 cm  LV e' lateral:   12.10 cm/s LV PW:         1.00 cm  LV E/e' lateral: 8.3 LV IVS:        1.20 cm LVOT diam:     2.20 cm LV SV:         71 LV SV Index:   32 LVOT Area:     3.80 cm  LEFT ATRIUM           Index LA diam:      3.80 cm 1.72 cm/m LA Vol (A2C): 36.4 ml 16.45 ml/m  AORTIC VALVE LVOT Vmax:   89.40 cm/s LVOT Vmean:  75.700 cm/s LVOT VTI:    0.188 m  AORTA Ao Root diam: 3.60 cm MITRAL VALVE MV Area (PHT): 2.54 cm     SHUNTS MV Decel Time: 299 msec     Systemic VTI:  0.19 m MV E velocity: 101.00 cm/s  Systemic Diam: 2.20 cm Charlton HawsPeter Nishan MD Electronically signed by Charlton HawsPeter Nishan MD Signature Date/Time: 12/04/2019/11:48:36 AM    Final         Scheduled Meds: . albuterol  2 puff Inhalation Q6H  .  vitamin C  500 mg Oral Daily  . baricitinib  4 mg Oral Daily  . enoxaparin (LOVENOX) injection  55 mg Subcutaneous Q24H  . famotidine  20 mg Oral BID  . fluticasone  2 spray Each Nare Daily  . furosemide  40 mg Intravenous BID  . insulin aspart  0-20 Units Subcutaneous TID WC  . insulin aspart  12 Units Subcutaneous TID WC  . insulin detemir  44 Units Subcutaneous BID  . linagliptin  5 mg Oral Daily  . mouth rinse  15 mL Mouth Rinse BID  . methylPREDNISolone (SOLU-MEDROL) injection  60 mg Intravenous Q12H  . sodium chloride flush  3 mL Intravenous Q12H  . traZODone  50 mg Oral QHS  . zinc sulfate  220 mg Oral Daily    Continuous Infusions:   LOS: 5 days    Time spent: 36 minutes spent on chart review, discussion with nursing staff, consultants, updating family and interview/physical exam; more than 50% of that time was spent in counseling and/or coordination of care.    Alvira Philips [place], DO [facility] Available via Epic secure chat 7am-7pm After these hours, please refer to coverage provider listed on [email or web address] 12/06/2019, 11:04 AM

## 2019-12-07 ENCOUNTER — Inpatient Hospital Stay (HOSPITAL_COMMUNITY): Payer: 59

## 2019-12-07 ENCOUNTER — Encounter (HOSPITAL_COMMUNITY): Payer: Self-pay | Admitting: Internal Medicine

## 2019-12-07 DIAGNOSIS — R7989 Other specified abnormal findings of blood chemistry: Secondary | ICD-10-CM

## 2019-12-07 LAB — D-DIMER, QUANTITATIVE: D-Dimer, Quant: 7.29 ug/mL-FEU — ABNORMAL HIGH (ref 0.00–0.50)

## 2019-12-07 LAB — COMPREHENSIVE METABOLIC PANEL
ALT: 77 U/L — ABNORMAL HIGH (ref 0–44)
AST: 33 U/L (ref 15–41)
Albumin: 2.6 g/dL — ABNORMAL LOW (ref 3.5–5.0)
Alkaline Phosphatase: 99 U/L (ref 38–126)
Anion gap: 11 (ref 5–15)
BUN: 33 mg/dL — ABNORMAL HIGH (ref 6–20)
CO2: 31 mmol/L (ref 22–32)
Calcium: 8.4 mg/dL — ABNORMAL LOW (ref 8.9–10.3)
Chloride: 95 mmol/L — ABNORMAL LOW (ref 98–111)
Creatinine, Ser: 0.9 mg/dL (ref 0.61–1.24)
GFR calc Af Amer: >60 mL/min (ref >60)
GFR calc non Af Amer: >60 mL/min (ref >60)
Glucose, Bld: 384 mg/dL — ABNORMAL HIGH (ref 70–99)
Potassium: 4.4 mmol/L (ref 3.5–5.1)
Sodium: 137 mmol/L (ref 135–145)
Total Bilirubin: 0.4 mg/dL (ref 0.3–1.2)
Total Protein: 5.8 g/dL — ABNORMAL LOW (ref 6.5–8.1)

## 2019-12-07 LAB — CBC
HCT: 37.9 % — ABNORMAL LOW (ref 39.0–52.0)
Hemoglobin: 11.7 g/dL — ABNORMAL LOW (ref 13.0–17.0)
MCH: 24.1 pg — ABNORMAL LOW (ref 26.0–34.0)
MCHC: 30.9 g/dL (ref 30.0–36.0)
MCV: 78.1 fL — ABNORMAL LOW (ref 80.0–100.0)
Platelets: 547 10*3/uL — ABNORMAL HIGH (ref 150–400)
RBC: 4.85 MIL/uL (ref 4.22–5.81)
RDW: 14 % (ref 11.5–15.5)
WBC: 12.1 10*3/uL — ABNORMAL HIGH (ref 4.0–10.5)
nRBC: 0.7 % — ABNORMAL HIGH (ref 0.0–0.2)

## 2019-12-07 LAB — GLUCOSE, CAPILLARY
Glucose-Capillary: 326 mg/dL — ABNORMAL HIGH (ref 70–99)
Glucose-Capillary: 397 mg/dL — ABNORMAL HIGH (ref 70–99)
Glucose-Capillary: 475 mg/dL — ABNORMAL HIGH (ref 70–99)
Glucose-Capillary: 468 mg/dL — ABNORMAL HIGH (ref 70–99)

## 2019-12-07 LAB — C-REACTIVE PROTEIN: CRP: 3.1 mg/dL — ABNORMAL HIGH (ref <1.0)

## 2019-12-07 LAB — GLUCOSE, RANDOM: Glucose, Bld: 427 mg/dL — ABNORMAL HIGH (ref 70–99)

## 2019-12-07 IMAGING — CT CT ANGIO CHEST
3 of 7 series · 19 of 46 positions shown · IV contrast (APPLIED)
Comparison: [DATE] chest radiograph.

CLINICAL DATA: High probability [83] infection. Hypoxia. Up
trending D-dimer.

EXAM:
CT ANGIOGRAPHY CHEST WITH CONTRAST
TECHNIQUE: Multidetector CT imaging of the chest was performed using the
standard protocol during bolus administration of intravenous
contrast. Multiplanar CT image reconstructions and MIPs were
obtained to evaluate the vascular anatomy.
CONTRAST:  100mL OMNIPAQUE IOHEXOL 350 MG/ML SOLN

[Series 5: thins · axial · 0.76mm/px · z∈[-296,-53]mm · 15 of 279 slices shown]
[im 18/279  lung]
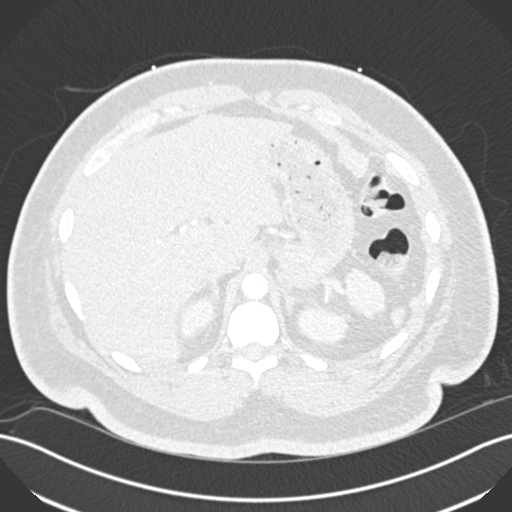
[im 35/279  soft-tissue]
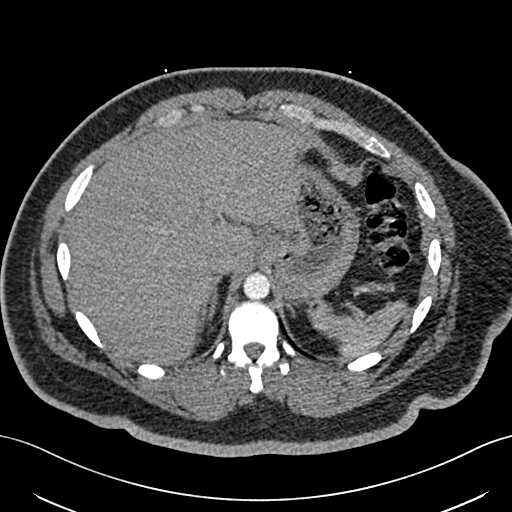
[im 53/279  lung]
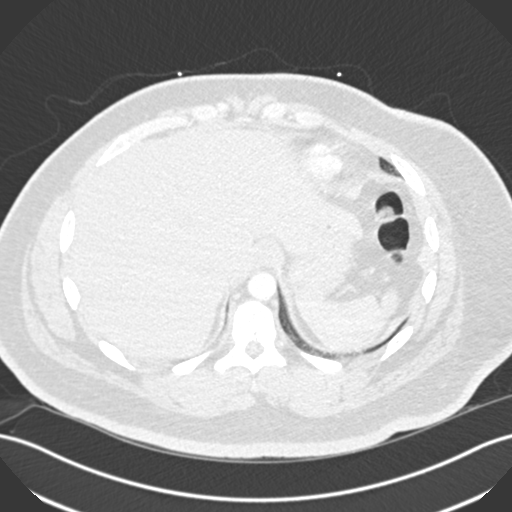
[im 70/279  soft-tissue]
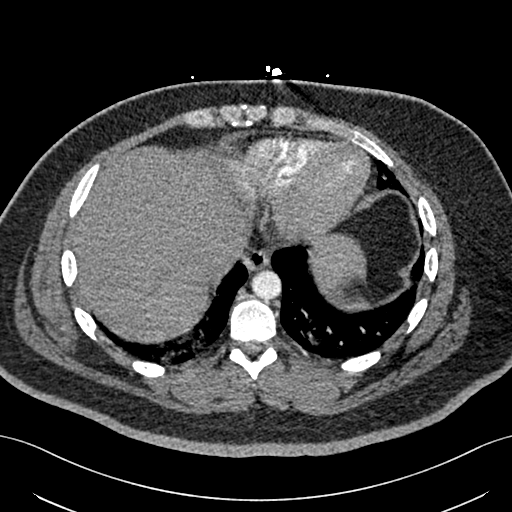
[im 87/279  lung]
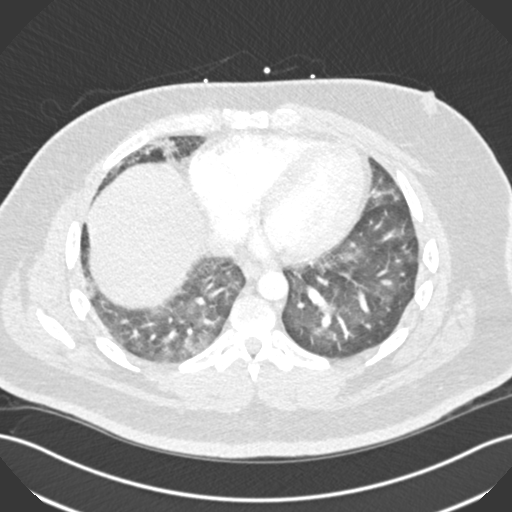
[im 105/279  soft-tissue]
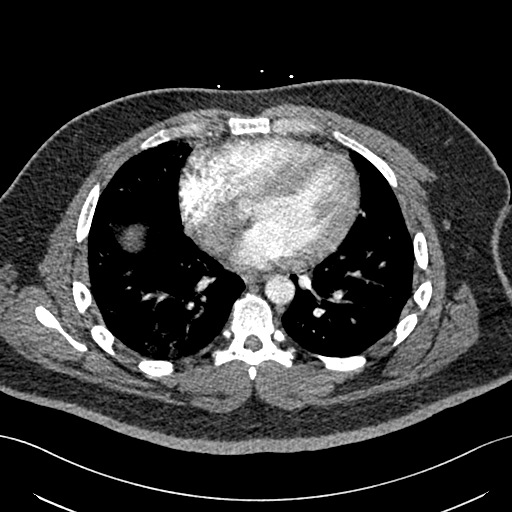
[im 122/279  lung]
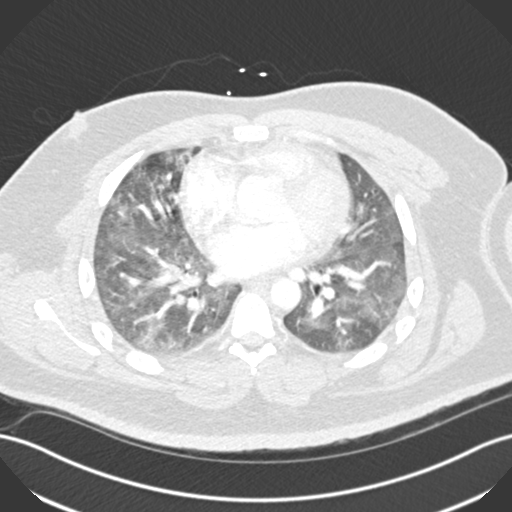
[im 140/279  soft-tissue]
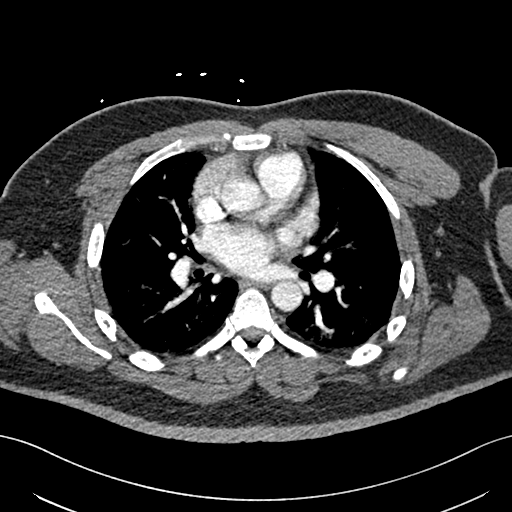
[im 157/279  lung]
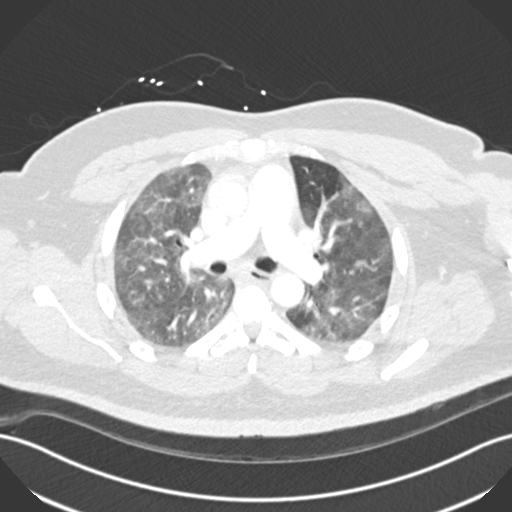
[im 174/279  soft-tissue]
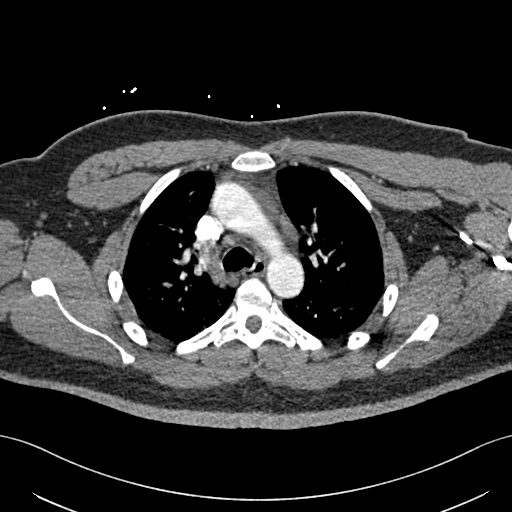
[im 192/279  lung]
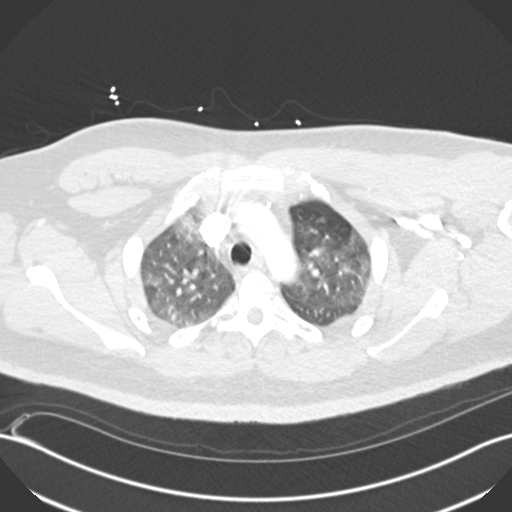
[im 209/279  soft-tissue]
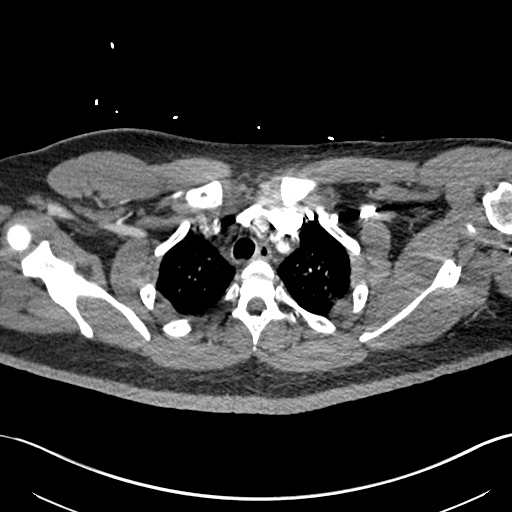
[im 226/279  lung]
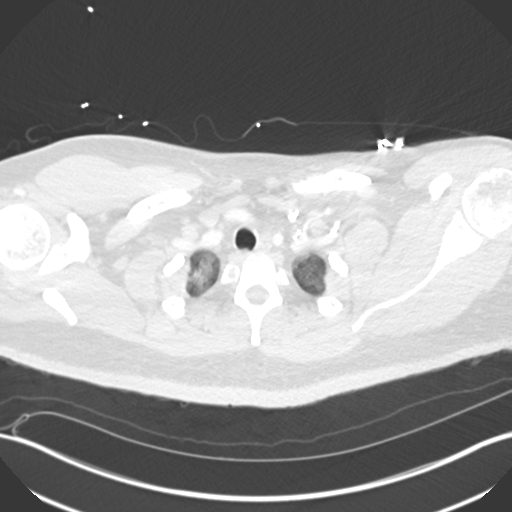
[im 244/279  soft-tissue]
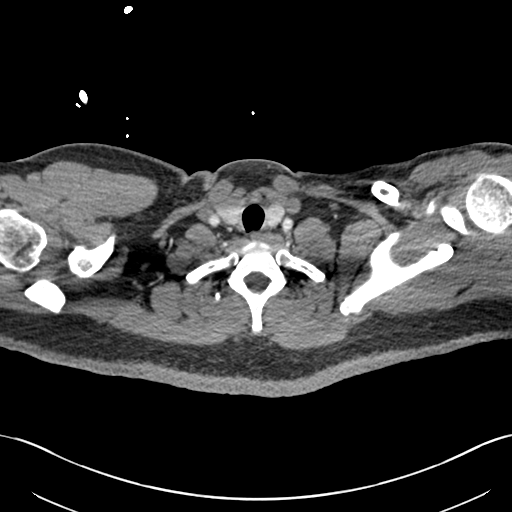
[im 261/279  lung]
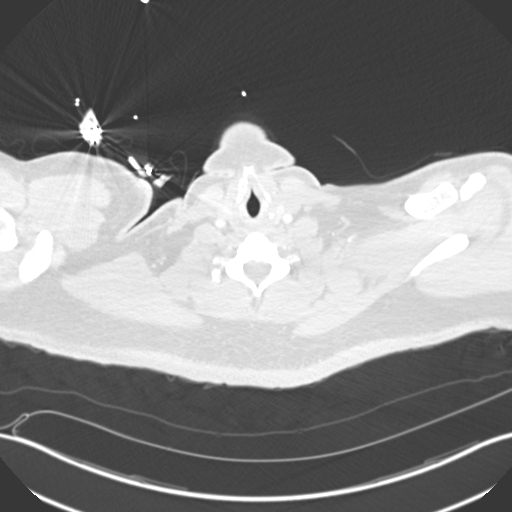

[Series 6: lung · axial · 0.76mm/px · z∈[-279,-245]mm · 2 of 140 slices shown]
[im 18/140  soft-tissue]
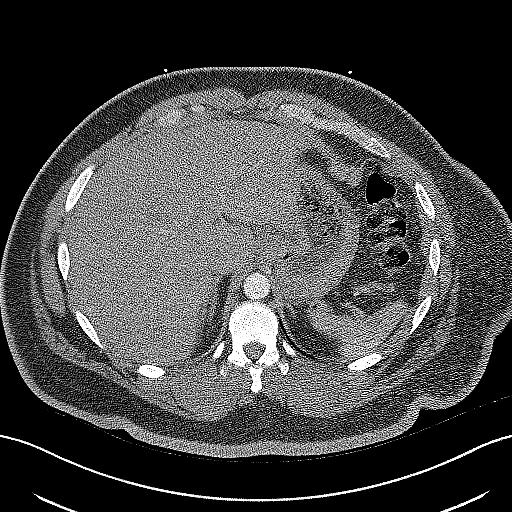
[im 35/140  soft-tissue]
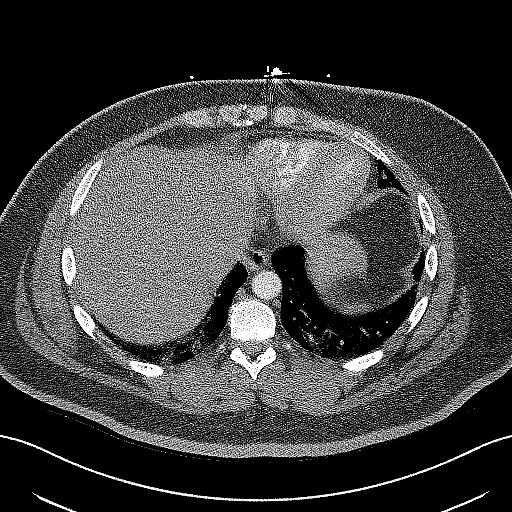

[Series 7: coronal mpr · coronal · 0.56mm/px · 2 of 102 slices shown]
[im 34/102  soft-tissue]
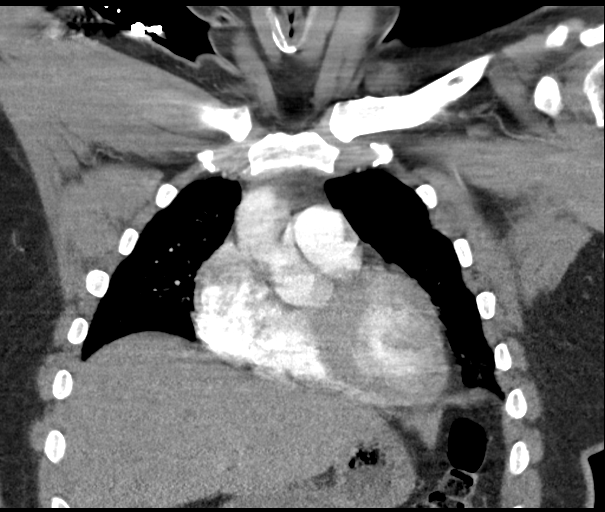
[im 68/102  soft-tissue]
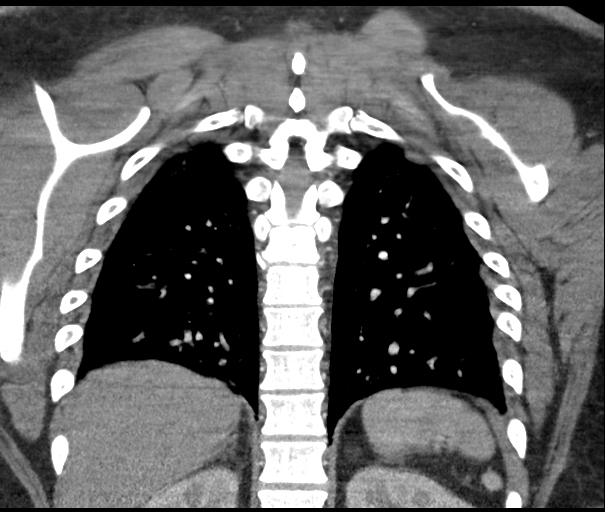

[19 of 46 positions shown; findings below may reference images not displayed]

FINDINGS: Cardiovascular: The study is moderate quality for the evaluation of
pulmonary embolism, with some limitation due to motion degradation.
There are no convincing filling defects in the central, lobar,
segmental or subsegmental pulmonary artery branches to suggest acute
pulmonary embolism. Great vessels are normal in course and caliber.
Normal heart size. No significant pericardial fluid/thickening.

Mediastinum/Nodes: No discrete thyroid nodules. Unremarkable
esophagus. No pathologically enlarged axillary, mediastinal or hilar
lymph nodes.

Lungs/Pleura: No pneumothorax. No pleural effusion. Extensive patchy
ground-glass opacity throughout both lungs involving all lung lobes.
No lung masses or significant pulmonary nodules.

Upper abdomen: No acute abnormality.

Musculoskeletal:  No aggressive appearing focal osseous lesions.

Review of the MIP images confirms the above findings.
IMPRESSION: 1. No evidence of pulmonary embolism.
2. Extensive patchy ground-glass opacity throughout both lungs,
compatible with [83] pneumonia.

## 2019-12-07 MED ORDER — METHYLPREDNISOLONE SODIUM SUCC 125 MG IJ SOLR: 40.0000 mg | Freq: Every day | INTRAMUSCULAR | Status: DC

## 2019-12-07 MED ORDER — INSULIN ASPART 100 UNIT/ML ~~LOC~~ SOLN
15.0000 [IU] | Freq: Once | SUBCUTANEOUS | Status: AC
  Administered 2019-12-07: 15 [IU] via SUBCUTANEOUS

## 2019-12-07 MED ORDER — IOHEXOL 350 MG/ML SOLN
100.0000 mL | Freq: Once | INTRAVENOUS | Status: AC | PRN
  Administered 2019-12-07: 100 mL via INTRAVENOUS

## 2019-12-07 NOTE — Progress Notes (Signed)
Bilateral lower extremity venous duplex has been completed. Preliminary results can be found in CV Proc through chart review.   12/07/19 10:39 AM Olen Cordial RVT

## 2019-12-07 NOTE — Progress Notes (Signed)
PT Cancellation Note  Patient Details Name: Aaron Woods MRN: [id number] DOB: 1985-10-05   Cancelled Treatment:    Reason Eval/Treat Not Completed: PT screened, no needs identified, will sign off  RN reports pt now down to 2L and mobilizing in room well.  RN reports no PT needs at this time.  Please reorder if new needs arise.  PT to sign off.  Kylyn Sookram,KATHrine E 12/07/2019, 1:05 PM Paulino Door, DPT Acute Rehabilitation Services Pager: [phone] Office: [phone]

## 2019-12-07 NOTE — Plan of Care (Signed)

## 2019-12-07 NOTE — Progress Notes (Signed)
Pt sitting in recliner with O2 sat's 98-100% on 2L [place]. RN removed O2 and will continue to monitor patient. RN educated pt that O2 is at the bedside if he feels SOB and needs to put it back on. Pt verbalized understanding.

## 2019-12-07 NOTE — Progress Notes (Signed)
PROGRESS NOTE    SLOAN TAKAGI  ZOX:[id number] DOB: 04/04/1985 DOA: 12/01/2019 PCP: Georgina Quint, MD    Brief Narrative:  Aaron Woods is a 34 year old with past medical history of insulin-dependent diabetes mellitus, hyperlipidemia, obesity presented to the emergency room on 9/9 with cough and shortness of breath. Patient had recently tested positive for COVID-19 on 9/5. He is unvaccinated. Noted to be hypoxic with oxygen saturations in the 70s. Admitted to the hospital service and started on Remdisivir.   Assessment & Plan:   Principal Problem:   Pneumonia due to COVID-19 virus Active Problems:   Uncontrolled type 2 diabetes mellitus with hyperglycemia (HCC)   Acute respiratory failure with hypoxia (HCC)   Sepsis (HCC)   Microcytic anemia   Obesity (BMI 30.0-34.9)  Acute hypoxemic respiratory failure secondary to COVID-19 pneumonia: Sepsis ruled out. Continue to monitor due to significant symptoms  chest physiotherapy, incentive spirometry, deep breathing exercises, sputum induction, mucolytic's and bronchodilators. Supplemental oxygen to keep saturations more than 90%. Covid directed therapy with , steroids, remains on high-dose Solu-Medrol remdesivir, completed 5 days of therapy Baricitinib, day 7/14 Intermittent Lasix. Due to severity of symptoms, patient will need daily inflammatory markers, chest x-rays, liver function test to monitor and direct COVID-19 therapies. D-dimer continues to go up, will check CTA of the chest and lower extremity duplexes.    COVID-19 Labs  Recent Labs    12/05/19 0447 12/06/19 0444 12/07/19 0502  DDIMER 2.24* 6.59* 7.29*  CRP 4.9* 2.7* 3.1*    Lab Results  Component Value Date   SARSCOV2NAA Not Detected 02/10/2019   SpO2: 100 % O2 Flow Rate (L/min): 3 L/min   Type 2 diabetes: A1c 8.8.  On insulin in the hospital.  Blood sugar is still elevated. Continue current doses of insulin.  Taper off steroids with clinical  improvement.  Obesity: BMI 34.  DVT prophylaxis: On Lovenox subcu   Code Status: Full code Family Communication: None.  Patient is talking. Disposition Plan: Status is: Inpatient  Remains inpatient appropriate because:Inpatient level of care appropriate due to severity of illness   Dispo:  Patient From: Home  Planned Disposition: Home  Expected discharge date: 9/16  medically stable for discharge: No          Consultants:   None  Procedures:   None  Antimicrobials:  Anti-infectives (From admission, onward)   Start     Dose/Rate Route Frequency Ordered Stop   12/02/19 1000  remdesivir 100 mg in sodium chloride 0.9 % 100 mL IVPB       "Followed by" Linked Group Details   100 mg 200 mL/hr over 30 Minutes Intravenous Daily 12/01/19 1219 12/05/19 1906   12/01/19 2000  cefTRIAXone (ROCEPHIN) 2 g in sodium chloride 0.9 % 100 mL IVPB  Status:  Discontinued        2 g 200 mL/hr over 30 Minutes Intravenous Every 24 hours 12/01/19 1857 12/02/19 1229   12/01/19 2000  azithromycin (ZITHROMAX) tablet 500 mg  Status:  Discontinued        500 mg Oral Daily 12/01/19 1857 12/02/19 1229   12/01/19 1300  remdesivir 200 mg in sodium chloride 0.9% 250 mL IVPB       "Followed by" Linked Group Details   200 mg 580 mL/hr over 30 Minutes Intravenous Once 12/01/19 1219 12/01/19 1433         Subjective: Patient was seen and examined.  No overnight events.  Still on 3 L of oxygen and has  not mobilized.  He feels his breathing is better than yesterday.  Denies any chest pain or calf pain.  Objective: Vitals:   12/06/19 2100 12/07/19 0504 12/07/19 0730 12/07/19 1200  BP: (!) 138/93 (!) 130/91    Pulse: 100 93    Resp: 20 18    Temp: 98.7 F (37.1 C) 98.5 F (36.9 C)    TempSrc: Oral Oral    SpO2: 93% 94% 96% 100%  Weight:      Height:        Intake/Output Summary (Last 24 hours) at 12/07/2019 1218 Last data filed at 12/07/2019 1000 Gross per 24 hour  Intake 483 ml  Output  1150 ml  Net -667 ml   Filed Weights   12/01/19 0901  Weight: 106.6 kg    Examination:  General exam: Appears calm and comfortable .  Eating breakfast.  Without any distress. Respiratory system: Clear to auscultation. Respiratory effort normal.  On 3 L oxygen. Cardiovascular system: S1 & S2 heard, RRR. No JVD, murmurs, rubs, gallops or clicks. No pedal edema. Gastrointestinal system: Abdomen is nondistended, soft and nontender. No organomegaly or masses felt. Normal bowel sounds heard. Central nervous system: Alert and oriented. No focal neurological deficits. Extremities: Symmetric 5 x 5 power. Skin: No rashes, lesions or ulcers Psychiatry: Judgement and insight appear normal. Mood & affect appropriate.     Data Reviewed: I have personally reviewed following labs and imaging studies  CBC: Recent Labs  Lab 12/02/19 0427 12/02/19 0427 12/03/19 0528 12/04/19 0548 12/05/19 0447 12/06/19 0444 12/07/19 0502  WBC 4.2   < > 13.2* 12.9* 12.3* 10.9* 12.1*  NEUTROABS 2.8  --  10.6* 10.6* 9.5* 8.2*  --   HGB 12.2*   < > 12.6* 12.1* 12.3* 12.4* 11.7*  HCT 40.6   < > 41.7 40.1 40.0 39.7 37.9*  MCV 79.8*   < > 80.5 78.8* 78.3* 77.7* 78.1*  PLT 278   < > 363 455* 469* 482* 547*   < > = values in this interval not displayed.   Basic Metabolic Panel: Recent Labs  Lab 12/02/19 0427 12/02/19 0427 12/03/19 0528 12/04/19 0548 12/05/19 0447 12/06/19 0444 12/07/19 0502  NA 136   < > 138 138 138 137 137  K 4.4   < > 4.8 4.5 4.6 4.2 4.4  CL 99   < > 99 96* 94* 94* 95*  CO2 23   < > 26 30 32 32 31  GLUCOSE 336*   < > 245* 250* 277* 276* 384*  BUN 27*   < > 28* 25* 25* 26* 33*  CREATININE 1.33*   < > 1.02 0.94 0.85 0.92 0.90  CALCIUM 8.4*   < > 8.6* 8.4* 8.7* 8.7* 8.4*  MG 2.4  --   --   --   --   --   --   PHOS 3.6  --   --   --   --   --   --    < > = values in this interval not displayed.   GFR: Estimated Creatinine Clearance: 136.6 mL/min (by C-G formula based on SCr of 0.9  mg/dL). Liver Function Tests: Recent Labs  Lab 12/03/19 0528 12/04/19 0548 12/05/19 0447 12/06/19 0444 12/07/19 0502  AST 78* 81* 65* 41 33  ALT 65* 76* 81* 74* 77*  ALKPHOS 78 88 98 93 99  BILITOT 0.3 0.3 0.4 0.4 0.4  PROT 6.4* 6.7 6.4* 6.1* 5.8*  ALBUMIN 2.9* 2.8* 2.9* 2.7* 2.6*  No results for input(s): LIPASE, AMYLASE in the last 168 hours. No results for input(s): AMMONIA in the last 168 hours. Coagulation Profile: No results for input(s): INR, PROTIME in the last 168 hours. Cardiac Enzymes: No results for input(s): CKTOTAL, CKMB, CKMBINDEX, TROPONINI in the last 168 hours. BNP (last 3 results) No results for input(s): PROBNP in the last 8760 hours. HbA1C: No results for input(s): HGBA1C in the last 72 hours. CBG: Recent Labs  Lab 12/06/19 1247 12/06/19 1625 12/06/19 2059 12/07/19 0834 12/07/19 1108  GLUCAP 308* 362* 464* 326* 397*   Lipid Profile: No results for input(s): CHOL, HDL, LDLCALC, TRIG, CHOLHDL, LDLDIRECT in the last 72 hours. Thyroid Function Tests: No results for input(s): TSH, T4TOTAL, FREET4, T3FREE, THYROIDAB in the last 72 hours. Anemia Panel: No results for input(s): VITAMINB12, FOLATE, FERRITIN, TIBC, IRON, RETICCTPCT in the last 72 hours. Sepsis Labs: Recent Labs  Lab 12/01/19 1047  PROCALCITON 0.17  LATICACIDVEN 1.4    Recent Results (from the past 240 hour(s))  Blood Culture (routine x 2)     Status: None   Collection Time: 12/01/19 10:47 AM   Specimen: BLOOD  Result Value Ref Range Status   Specimen Description   Final    BLOOD LEFT ANTECUBITAL Performed at [facility], [address] [place], [place] [address]    Special Requests   Final    BOTTLES DRAWN AEROBIC AND ANAEROBIC Blood Culture results may not be optimal due to an inadequate volume of blood received in culture bottles Performed at [facility], [address] [place], [place] [address]    Culture   Final    NO GROWTH 5  DAYS Performed at [facility], [address] [place], [place] [address]    Report Status 12/06/2019 FINAL  Final  Blood Culture (routine x 2)     Status: None   Collection Time: 12/01/19 10:47 AM   Specimen: BLOOD  Result Value Ref Range Status   Specimen Description   Final    BLOOD RIGHT ANTECUBITAL Performed at [facility], [address] [place], [place] [address]    Special Requests   Final    BOTTLES DRAWN AEROBIC AND ANAEROBIC Blood Culture results may not be optimal due to an inadequate volume of blood received in culture bottles Performed at [facility], [address] [place], [place] [address]    Culture   Final    NO GROWTH 5 DAYS Performed at [facility], [address] [place], [place] [address]    Report Status 12/06/2019 FINAL  Final         Radiology Studies: VAS US LOWER EXTREMITY VENOUS (DVT)  Result Date: 12/07/2019  Lower Venous DVT Study Indications: Elevated Ddimer.  Risk Factors: COVID 19 positive. Comparison Study: No prior studies. Performing Technologist: Chanda BusingGregory Collins RVT  Examination Guidelines: A complete evaluation includes B-mode imaging, spectral Doppler, color Doppler, and power Doppler as needed of all accessible portions of each vessel. Bilateral testing is considered an integral part of a complete examination. Limited examinations for reoccurring indications may be performed as noted. The reflux portion of the exam is performed with the patient in reverse Trendelenburg.  +---------+---------------+---------+-----------+----------+--------------+ RIGHT    CompressibilityPhasicitySpontaneityPropertiesThrombus Aging +---------+---------------+---------+-----------+----------+--------------+ CFV      Full           Yes      Yes                                 +---------+---------------+---------+-----------+----------+--------------+  SFJ      Full                                                         +---------+---------------+---------+-----------+----------+--------------+ FV Prox  Full                                                        +---------+---------------+---------+-----------+----------+--------------+ FV Mid   Full                                                        +---------+---------------+---------+-----------+----------+--------------+ FV DistalFull                                                        +---------+---------------+---------+-----------+----------+--------------+ PFV      Full                                                        +---------+---------------+---------+-----------+----------+--------------+ POP      Full           Yes      Yes                                 +---------+---------------+---------+-----------+----------+--------------+ PTV      Full                                                        +---------+---------------+---------+-----------+----------+--------------+ PERO     Full                                                        +---------+---------------+---------+-----------+----------+--------------+   +---------+---------------+---------+-----------+----------+--------------+ LEFT     CompressibilityPhasicitySpontaneityPropertiesThrombus Aging +---------+---------------+---------+-----------+----------+--------------+ CFV      Full           Yes      Yes                                 +---------+---------------+---------+-----------+----------+--------------+ SFJ      Full                                                        +---------+---------------+---------+-----------+----------+--------------+  FV Prox  Full                                                        +---------+---------------+---------+-----------+----------+--------------+ FV Mid   Full                                                         +---------+---------------+---------+-----------+----------+--------------+ FV DistalFull                                                        +---------+---------------+---------+-----------+----------+--------------+ PFV      Full                                                        +---------+---------------+---------+-----------+----------+--------------+ POP      Full           Yes      Yes                                 +---------+---------------+---------+-----------+----------+--------------+ PTV      Full                                                        +---------+---------------+---------+-----------+----------+--------------+ PERO     Full                                                        +---------+---------------+---------+-----------+----------+--------------+     Summary: RIGHT: - There is no evidence of deep vein thrombosis in the lower extremity.  - No cystic structure found in the popliteal fossa.  LEFT: - There is no evidence of deep vein thrombosis in the lower extremity.  - No cystic structure found in the popliteal fossa.  *See table(s) above for measurements and observations.    Preliminary         Scheduled Meds: . albuterol  2 puff Inhalation Q6H  . vitamin C  500 mg Oral Daily  . baricitinib  4 mg Oral Daily  . enoxaparin (LOVENOX) injection  55 mg Subcutaneous Q24H  . famotidine  20 mg Oral BID  . fluticasone  2 spray Each Nare Daily  . insulin aspart  0-20 Units Subcutaneous TID WC  . insulin aspart  16 Units Subcutaneous TID WC  . insulin detemir  50 Units Subcutaneous BID  . linagliptin  5 mg Oral Daily  . mouth rinse  15 mL Mouth Rinse BID  . [  START ON 12/08/2019] methylPREDNISolone (SOLU-MEDROL) injection  40 mg Intravenous Daily  . sodium chloride flush  3 mL Intravenous Q12H  . traZODone  50 mg Oral QHS  . zinc sulfate  220 mg Oral Daily   Continuous Infusions:   LOS: 6 days    Time spent: 30  minutes    Dorcas Carrow, MD [facility] Pager [phone]

## 2019-12-07 NOTE — Progress Notes (Signed)
Inpatient Diabetes Program Recommendations  AACE/ADA: New Consensus Statement on Inpatient Glycemic Control (2015)  Target Ranges:  Prepandial:   less than 140 mg/dL      Peak postprandial:   less than 180 mg/dL (1-2 hours)      Critically ill patients:  140 - 180 mg/dL   Lab Results  Component Value Date   GLUCAP 464 (H) 12/06/2019   HGBA1C 8.8 (H) 12/01/2019    Review of Glycemic Control  Diabetes history: DM2 Outpatient Diabetes medications: Humalog 75/25 26 units bid, metformin 500 mg QAM (not taking), Victoza 1.2 mg QD Current orders for Inpatient glycemic control:  Levemir 50 units bid Novolog 0-20 units tid  Tradjenta 5 mg Daily Novolog 16 units tid  HgbA1C 8.8%  Inpatient Diabetes Program Recommendations:     -  Increase Levemir 60 units bid -  Increase Novolog meal coverage 20 units tid -  Increase frequency of Novolog Resistant scale to Q4 hours  Pt very resistant may have to place on IV insulin if glucose levels do not improve.  Thank you. Christena Deem RN, MSN, BC-ADM Inpatient Diabetes Coordinator Team Pager [phone] (8a-5p)

## 2019-12-07 NOTE — Progress Notes (Signed)
RN at bedside. Pt O2 sat 100% on 3L [place]. RN titrated O2 to 2L and encouraged pt to ambulate in room.

## 2019-12-07 NOTE — Progress Notes (Signed)
RN notified Ghimire MD of pt's CBG 475 and stat glucose lab draw 427.   Pt remains on room air with O2 sat of 95%.

## 2019-12-08 LAB — GLUCOSE, CAPILLARY
Glucose-Capillary: 62 mg/dL — ABNORMAL LOW (ref 70–99)
Glucose-Capillary: 146 mg/dL — ABNORMAL HIGH (ref 70–99)
Glucose-Capillary: 177 mg/dL — ABNORMAL HIGH (ref 70–99)

## 2019-12-08 NOTE — Progress Notes (Signed)
RN notified MD Ghimire of pt's CBG in the 60's this morning. Provided pt with orange juice and repeat CBG was 140's. MD stated to hold all AM insulin and that he discontinued his steroids d/t hyperglycemia. Notified pt and RN will continue to monitor.

## 2019-12-08 NOTE — Discharge Summary (Signed)
Physician Discharge Summary  DEAUNDRE ALLSTON WCB:[id number] DOB: 12-Oct-1985 DOA: 12/01/2019  PCP: Horald Pollen, MD  Admit date: 12/01/2019 Discharge date: 12/08/2019  Admitted From: Home Disposition: Home  Recommendations for Outpatient Follow-up:  1. Follow up with PCP in 1-2 weeks 2. Follow-up with your PCP and highly recommend you take COVID-19 vaccination once you improved from current symptoms.  Home Health: Not applicable Equipment/Devices: Not applicable  Discharge Condition: Stable CODE STATUS: Full code Diet recommendation: Low-carb diet  Discharge summary: Aaron Woods a 34 year old with past medical history of insulin-dependent diabetes mellitus, hyperlipidemia, obesity presented to the emergency room on 9/9 with cough and shortness of breath. Patient had recently tested positive for COVID-19 on 9/5. He is unvaccinated. Noted to be hypoxic with oxygen saturations in the 70s. Patient was admitted to the hospital and treated with  Covid directed therapy with remdesivir for 5 days, he was on baricitinib day 8 today, high-dose Solu-Medrol and other supportive treatment.  Patient did good clinical recovery and currently most of the symptoms have improved.  Patient is on room air today.  Ambulated with no supplemental oxygen needed.  Mostly asymptomatic today.  With elevated D-dimer, had CTA of the chest and duplexes of the lower extremities they were negative for presence of thromboembolism.  Plan: Already received 7 days of steroids, symptoms improved, discontinue all the steroids. Baricitinib received for 8 days, now on room air so will discontinue. Discharging home with supportive treatment. Resume all home insulin regimen. Strongly recommended to take COVID-19 vaccination. Due to moderate to severe symptoms and hospitalization, total isolation duration 3 weeks from first positive test.  Discharge Diagnoses:  Principal Problem:   Pneumonia due to COVID-19  virus Active Problems:   Uncontrolled type 2 diabetes mellitus with hyperglycemia (HCC)   Acute respiratory failure with hypoxia (HCC)   Sepsis (HCC)   Microcytic anemia   Obesity (BMI 30.0-34.9)    Discharge Instructions  Discharge Instructions    Call MD for:  difficulty breathing, headache or visual disturbances   Complete by: As directed    Call MD for:  temperature >100.4   Complete by: As directed    Diet Carb Modified   Complete by: As directed    Discharge instructions   Complete by: As directed    Can use over the counter cough medicine and tylenol Isolation until 9/25 I highly recommend you take covid 19 vaccination after 2-3 weeks   Increase activity slowly   Complete by: As directed      Allergies as of 12/08/2019   No Known Allergies     Medication List    STOP taking these medications   atorvastatin 20 MG tablet Commonly known as: LIPITOR   metFORMIN 500 MG tablet Commonly known as: GLUCOPHAGE     TAKE these medications   acetaminophen 325 MG tablet Commonly known as: TYLENOL Take 650 mg by mouth every 6 (six) hours as needed for mild pain or headache.   blood glucose meter kit and supplies by Other route as directed. Dispense based on patient and insurance preference. Use up to four times daily as directed. (FOR ICD-10 E10.9, E11.9).   Insulin Lispro Prot & Lispro (75-25) 100 UNIT/ML Kwikpen Commonly known as: HumaLOG Mix 75/25 KwikPen Inject 26 Units into the skin 2 (two) times daily with a meal.   Insulin Pen Needle 31G X 5 MM Misc 1 Device by Does not apply route 2 (two) times daily.   liraglutide 18 MG/3ML Sopn Commonly  known as: VICTOZA Inject 0.1 mLs (0.6 mg total) into the skin daily. Increase to 1.$RemoveBefor'2mg'[id number]$  daily after first week. What changed:   how much to take  additional instructions       Follow-up Information    Horald Pollen, MD Follow up in 2 week(s).   Specialty: Internal Medicine Contact information: [place] [address] [phone]              No Known Allergies  Consultations:  None   Procedures/Studies: CT ANGIO CHEST PE W OR WO CONTRAST  Result Date: 12/07/2019 CLINICAL DATA:  High probability COVID-19 infection. Hypoxia. Up trending D-dimer. EXAM: CT ANGIOGRAPHY CHEST WITH CONTRAST TECHNIQUE: Multidetector CT imaging of the chest was performed using the standard protocol during bolus administration of intravenous contrast. Multiplanar CT image reconstructions and MIPs were obtained to evaluate the vascular anatomy. CONTRAST:  146mL OMNIPAQUE IOHEXOL 350 MG/ML SOLN COMPARISON:  12/01/2019 chest radiograph. FINDINGS: Cardiovascular: The study is moderate quality for the evaluation of pulmonary embolism, with some limitation due to motion degradation. There are no convincing filling defects in the central, lobar, segmental or subsegmental pulmonary artery branches to suggest acute pulmonary embolism. Great vessels are normal in course and caliber. Normal heart size. No significant pericardial fluid/thickening. Mediastinum/Nodes: No discrete thyroid nodules. Unremarkable esophagus. No pathologically enlarged axillary, mediastinal or hilar lymph nodes. Lungs/Pleura: No pneumothorax. No pleural effusion. Extensive patchy ground-glass opacity throughout both lungs involving all lung lobes. No lung masses or significant pulmonary nodules. Upper abdomen: No acute abnormality. Musculoskeletal:  No aggressive appearing focal osseous lesions. Review of the MIP images confirms the above findings. IMPRESSION: 1. No evidence of pulmonary embolism. 2. Extensive patchy ground-glass opacity throughout both lungs, compatible with COVID-19 pneumonia. Electronically Signed   By: Ilona Sorrel M.D.   On: 12/07/2019 17:19   DG Chest Port 1 View  Result Date: 12/01/2019 CLINICAL DATA:  Shortness of breath COVID positive EXAM: PORTABLE CHEST 1 VIEW COMPARISON:  04/14/2008 FINDINGS: Image rotated to the  RIGHT. Cardiomediastinal contours accentuated by portable technique likely unchanged from prior radiograph. Diffuse interstitial and airspace opacities with near confluent airspace disease in the RIGHT chest. This developed since previous radiograph, interstitial airspace process less pronounced in the LEFT chest. On limited assessment no acute skeletal process. IMPRESSION: Diffuse interstitial and airspace opacities with near confluent airspace disease in the RIGHT chest. This could be seen in the setting of viral pneumonia/COVID-19 infection. Electronically Signed   By: Zetta Bills M.D.   On: 12/01/2019 11:21   ECHOCARDIOGRAM COMPLETE  Result Date: 12/04/2019    ECHOCARDIOGRAM REPORT   Patient Name:   Aaron Woods Date of Exam: 12/04/2019 Medical Rec #:  [id number]       Height:       69.0 in Accession #:    [id number]      Weight:       235.0 lb Date of Birth:  1985-12-01       BSA:          2.213 m Patient Age:    34 years        BP:           134/95 mmHg Patient Gender: M               HR:           97 bpm. Exam Location:  Inpatient Procedure: 2D Echo Indications:    CHF 428  History:  Patient has no prior history of Echocardiogram examinations.                 Risk Factors:Diabetes. Covid +.  Sonographer:    Jannett Celestine RDCS (AE) Referring Phys: [id number] SENDIL K [facility]  Sonographer Comments: Image acquisition challenging due to patient body habitus. IMPRESSIONS  1. Left ventricular ejection fraction, by estimation, is 60 to 65%. The left ventricle has normal function. The left ventricle has no regional wall motion abnormalities. There is mild left ventricular hypertrophy. Left ventricular diastolic parameters were normal.  2. Right ventricular systolic function is normal. The right ventricular size is normal.  3. Left atrial size was mildly dilated.  4. The mitral valve is normal in structure. No evidence of mitral valve regurgitation. No evidence of mitral stenosis.  5. The aortic valve was not  well visualized. Aortic valve regurgitation is trivial. No aortic stenosis is present.  6. Aortic sinuses appear dilated in some views but only measure 3.6 cm . Aortic dilatation noted.  7. The inferior vena cava is normal in size with greater than 50% respiratory variability, suggesting right atrial pressure of 3 mmHg. FINDINGS  Left Ventricle: Left ventricular ejection fraction, by estimation, is 60 to 65%. The left ventricle has normal function. The left ventricle has no regional wall motion abnormalities. The left ventricular internal cavity size was normal in size. There is  mild left ventricular hypertrophy. Left ventricular diastolic parameters were normal. Right Ventricle: The right ventricular size is normal. No increase in right ventricular wall thickness. Right ventricular systolic function is normal. Left Atrium: Left atrial size was mildly dilated. Right Atrium: Right atrial size was normal in size. Pericardium: There is no evidence of pericardial effusion. Mitral Valve: The mitral valve is normal in structure. No evidence of mitral valve regurgitation. No evidence of mitral valve stenosis. Tricuspid Valve: The tricuspid valve is normal in structure. Tricuspid valve regurgitation is not demonstrated. No evidence of tricuspid stenosis. Aortic Valve: The aortic valve was not well visualized. Aortic valve regurgitation is trivial. No aortic stenosis is present. Pulmonic Valve: The pulmonic valve was normal in structure. Pulmonic valve regurgitation is not visualized. No evidence of pulmonic stenosis. Aorta: Aortic sinuses appear dilated in some views but only measure 3.6 cm. The aortic root is normal in size and structure and aortic dilatation noted. Venous: The inferior vena cava is normal in size with greater than 50% respiratory variability, suggesting right atrial pressure of 3 mmHg. IAS/Shunts: No atrial level shunt detected by color flow Doppler.  LEFT VENTRICLE PLAX 2D LVIDd:         4.40 cm   Diastology LVIDs:         2.90 cm  LV e' lateral:   12.10 cm/s LV PW:         1.00 cm  LV E/e' lateral: 8.3 LV IVS:        1.20 cm LVOT diam:     2.20 cm LV SV:         71 LV SV Index:   32 LVOT Area:     3.80 cm  LEFT ATRIUM           Index LA diam:      3.80 cm 1.72 cm/m LA Vol (A2C): 36.4 ml 16.45 ml/m  AORTIC VALVE LVOT Vmax:   89.40 cm/s LVOT Vmean:  75.700 cm/s LVOT VTI:    0.188 m  AORTA Ao Root diam: 3.60 cm MITRAL VALVE MV Area (PHT): 2.54 cm  SHUNTS MV Decel Time: 299 msec     Systemic VTI:  0.19 m MV E velocity: 101.00 cm/s  Systemic Diam: 2.20 cm Jenkins Rouge MD Electronically signed by Jenkins Rouge MD Signature Date/Time: 12/04/2019/11:48:36 AM    Final    VAS [place] LOWER EXTREMITY VENOUS (DVT)  Result Date: 12/07/2019  Lower Venous DVT Study Indications: Elevated Ddimer.  Risk Factors: COVID 19 positive. Comparison Study: No prior studies. Performing Technologist: Oliver Hum RVT  Examination Guidelines: A complete evaluation includes B-mode imaging, spectral Doppler, color Doppler, and power Doppler as needed of all accessible portions of each vessel. Bilateral testing is considered an integral part of a complete examination. Limited examinations for reoccurring indications may be performed as noted. The reflux portion of the exam is performed with the patient in reverse Trendelenburg.  +---------+---------------+---------+-----------+----------+--------------+ RIGHT    CompressibilityPhasicitySpontaneityPropertiesThrombus Aging +---------+---------------+---------+-----------+----------+--------------+ CFV      Full           Yes      Yes                                 +---------+---------------+---------+-----------+----------+--------------+ SFJ      Full                                                        +---------+---------------+---------+-----------+----------+--------------+ FV Prox  Full                                                         +---------+---------------+---------+-----------+----------+--------------+ FV Mid   Full                                                        +---------+---------------+---------+-----------+----------+--------------+ FV DistalFull                                                        +---------+---------------+---------+-----------+----------+--------------+ PFV      Full                                                        +---------+---------------+---------+-----------+----------+--------------+ POP      Full           Yes      Yes                                 +---------+---------------+---------+-----------+----------+--------------+ PTV      Full                                                        +---------+---------------+---------+-----------+----------+--------------+  PERO     Full                                                        +---------+---------------+---------+-----------+----------+--------------+   +---------+---------------+---------+-----------+----------+--------------+ LEFT     CompressibilityPhasicitySpontaneityPropertiesThrombus Aging +---------+---------------+---------+-----------+----------+--------------+ CFV      Full           Yes      Yes                                 +---------+---------------+---------+-----------+----------+--------------+ SFJ      Full                                                        +---------+---------------+---------+-----------+----------+--------------+ FV Prox  Full                                                        +---------+---------------+---------+-----------+----------+--------------+ FV Mid   Full                                                        +---------+---------------+---------+-----------+----------+--------------+ FV DistalFull                                                         +---------+---------------+---------+-----------+----------+--------------+ PFV      Full                                                        +---------+---------------+---------+-----------+----------+--------------+ POP      Full           Yes      Yes                                 +---------+---------------+---------+-----------+----------+--------------+ PTV      Full                                                        +---------+---------------+---------+-----------+----------+--------------+ PERO     Full                                                        +---------+---------------+---------+-----------+----------+--------------+  Summary: RIGHT: - There is no evidence of deep vein thrombosis in the lower extremity.  - No cystic structure found in the popliteal fossa.  LEFT: - There is no evidence of deep vein thrombosis in the lower extremity.  - No cystic structure found in the popliteal fossa.  *See table(s) above for measurements and observations. Electronically signed by [place] Barban MD on 12/07/2019 at 5:21:41 PM.    Final    (Echo, Carotid, EGD, Colonoscopy, ERCP)    Subjective: Patient seen and examined.  Up about in the room.  Ready to go home.  On room air since last night.   Discharge Exam: Vitals:   12/07/19 2047 12/08/19 0457  BP: (!) 143/96 112/75  Pulse: 96 92  Resp: 18 18  Temp: 98.9 F (37.2 C) 98.6 F (37 C)  SpO2: 94% 91%   Vitals:   12/07/19 1200 12/07/19 1426 12/07/19 2047 12/08/19 0457  BP:  128/81 (!) 143/96 112/75  Pulse:  97 96 92  Resp:  $Remo'20 18 18  'cJrDT$ Temp:  98.2 F (36.8 C) 98.9 F (37.2 C) 98.6 F (37 C)  TempSrc:  Oral Oral Oral  SpO2: 100% 93% 94% 91%  Weight:      Height:        General: Pt is alert, awake, not in acute distress Cardiovascular: RRR, S1/S2 +, no rubs, no gallops Respiratory: CTA bilaterally, no wheezing, no rhonchi, no added sounds. Abdominal: Soft, NT, ND, bowel sounds + Extremities: no  edema, no cyanosis    The results of significant diagnostics from this hospitalization (including imaging, microbiology, ancillary and laboratory) are listed below for reference.     Microbiology: Recent Results (from the past 240 hour(s))  Blood Culture (routine x 2)     Status: None   Collection Time: 12/01/19 10:47 AM   Specimen: BLOOD  Result Value Ref Range Status   Specimen Description   Final    BLOOD LEFT ANTECUBITAL Performed at [facility] [address] [place], [place] [address]    Special Requests   Final    BOTTLES DRAWN AEROBIC AND ANAEROBIC Blood Culture results may not be optimal due to an inadequate volume of blood received in culture bottles Performed at [facility] [address] [place], [place] [address]    Culture   Final    NO GROWTH 5 DAYS Performed at [facility], [place] [address], [place], [place] [address]    Report Status 12/06/2019 FINAL  Final  Blood Culture (routine x 2)     Status: None   Collection Time: 12/01/19 10:47 AM   Specimen: BLOOD  Result Value Ref Range Status   Specimen Description   Final    BLOOD RIGHT ANTECUBITAL Performed at [facility] [address], [place], [place] [address]    Special Requests   Final    BOTTLES DRAWN AEROBIC AND ANAEROBIC Blood Culture results may not be optimal due to an inadequate volume of blood received in culture bottles Performed at [facility] [address], [place],  [address]    Culture   Final    NO GROWTH 5 DAYS Performed at [facility], [place] [address] [place],  [address]    Report Status 12/06/2019 FINAL  Final     Labs: BNP (last 3 results) Recent Labs    12/03/19 0528  BNP 578.4*   Basic Metabolic Panel: Recent Labs  Lab 12/02/19 0427 12/02/19  6606 12/03/19 0528 12/03/19 0528 12/04/19 0548 12/05/19 0447 12/06/19 0444 12/07/19 0502 12/07/19 1726  NA 136   <  > 138  --  138 138 137 137  --   K 4.4   < > 4.8  --  4.5 4.6 4.2 4.4  --   CL 99   < > 99  --  96* 94* 94* 95*  --   CO2 23   < > 26  --  30 32 32 31  --   GLUCOSE 336*   < > 245*   < > 250* 277* 276* 384* 427*  BUN 27*   < > 28*  --  25* 25* 26* 33*  --   CREATININE 1.33*   < > 1.02  --  0.94 0.85 0.92 0.90  --   CALCIUM 8.4*   < > 8.6*  --  8.4* 8.7* 8.7* 8.4*  --   MG 2.4  --   --   --   --   --   --   --   --   PHOS 3.6  --   --   --   --   --   --   --   --    < > = values in this interval not displayed.   Liver Function Tests: Recent Labs  Lab 12/03/19 0528 12/04/19 0548 12/05/19 0447 12/06/19 0444 12/07/19 0502  AST 78* 81* 65* 41 33  ALT 65* 76* 81* 74* 77*  ALKPHOS 78 88 98 93 99  BILITOT 0.3 0.3 0.4 0.4 0.4  PROT 6.4* 6.7 6.4* 6.1* 5.8*  ALBUMIN 2.9* 2.8* 2.9* 2.7* 2.6*   No results for input(s): LIPASE, AMYLASE in the last 168 hours. No results for input(s): AMMONIA in the last 168 hours. CBC: Recent Labs  Lab 12/02/19 0427 12/02/19 0427 12/03/19 0528 12/04/19 0548 12/05/19 0447 12/06/19 0444 12/07/19 0502  WBC 4.2   < > 13.2* 12.9* 12.3* 10.9* 12.1*  NEUTROABS 2.8  --  10.6* 10.6* 9.5* 8.2*  --   HGB 12.2*   < > 12.6* 12.1* 12.3* 12.4* 11.7*  HCT 40.6   < > 41.7 40.1 40.0 39.7 37.9*  MCV 79.8*   < > 80.5 78.8* 78.3* 77.7* 78.1*  PLT 278   < > 363 455* 469* 482* 547*   < > = values in this interval not displayed.   Cardiac Enzymes: No results for input(s): CKTOTAL, CKMB, CKMBINDEX, TROPONINI in the last 168 hours. BNP: Invalid input(s): POCBNP CBG: Recent Labs  Lab 12/07/19 1108 12/07/19 1632 12/07/19 2044 12/08/19 0754 12/08/19 0836  GLUCAP 397* 475* 468* 62* 146*   D-Dimer Recent Labs    12/06/19 0444 12/07/19 0502  DDIMER 6.59* 7.29*   Hgb A1c No results for input(s): HGBA1C in the last 72 hours. Lipid Profile No results for input(s): CHOL, HDL, LDLCALC, TRIG, CHOLHDL, LDLDIRECT in the last 72 hours. Thyroid function studies No  results for input(s): TSH, T4TOTAL, T3FREE, THYROIDAB in the last 72 hours.  Invalid input(s): FREET3 Anemia work up No results for input(s): VITAMINB12, FOLATE, FERRITIN, TIBC, IRON, RETICCTPCT in the last 72 hours. Urinalysis No results found for: COLORURINE, APPEARANCEUR, [place], [place], [place], [place], [place], [place], PROTEINUR, UROBILINOGEN, NITRITE, LEUKOCYTESUR Sepsis Labs Invalid input(s): PROCALCITONIN,  WBC,  LACTICIDVEN Microbiology Recent Results (from the past 240 hour(s))  Blood Culture (routine x 2)     Status: None   Collection Time: 12/01/19 10:47 AM   Specimen: BLOOD  Result Value  Ref Range Status   Specimen Description   Final    BLOOD LEFT ANTECUBITAL Performed at [facility] [address] [place], [place] [address]    Special Requests   Final    BOTTLES DRAWN AEROBIC AND ANAEROBIC Blood Culture results may not be optimal due to an inadequate volume of blood received in culture bottles Performed at [facility] [address] [place], [place] [address]    Culture   Final    NO GROWTH 5 DAYS Performed at [facility], [place] [address], [place], [place] [address]    Report Status 12/06/2019 FINAL  Final  Blood Culture (routine x 2)     Status: None   Collection Time: 12/01/19 10:47 AM   Specimen: BLOOD  Result Value Ref Range Status   Specimen Description   Final    BLOOD RIGHT ANTECUBITAL Performed at [facility] [address], [place], [place] [address]    Special Requests   Final    BOTTLES DRAWN AEROBIC AND ANAEROBIC Blood Culture results may not be optimal due to an inadequate volume of blood received in culture bottles Performed at [facility] [address], [place], [place] [address]    Culture   Final    NO GROWTH 5 DAYS Performed at [facility], [place] [address] [place], [place] [address]    Report Status 12/06/2019 FINAL  Final      Time coordinating discharge: 45 minutes  SIGNED:   Barb Merino, MD  [facility] 12/08/2019, 11:19 AM

## 2019-12-08 NOTE — Progress Notes (Signed)
RN reviewed pt's discharge instructions. Pt being discharged home on room air with O2 sat of 94% when ambulating in room. . Tele removed. IV removed. VSS. All questions addressed.

## 2019-12-08 NOTE — Plan of Care (Signed)
  Problem: Education: Goal: Knowledge of risk factors and measures for prevention of condition will improve Outcome: Adequate for Discharge   Problem: Coping: Goal: Psychosocial and spiritual needs will be supported Outcome: Adequate for Discharge   Problem: Education: Goal: Knowledge of General Education information will improve Description: Including pain rating scale, medication(s)/side effects and non-pharmacologic comfort measures Outcome: Adequate for Discharge   Problem: Respiratory: Goal: Will maintain a patent airway Outcome: Completed/Met Goal: Complications related to the disease process, condition or treatment will be avoided or minimized Outcome: Completed/Met

## 2019-12-08 NOTE — TOC Progression Note (Signed)
Transition of Care [facility]) - Progression Note    Patient Details  Name: Aaron Woods MRN: [id number] Date of Birth: [birth year]  Transition of Care [facility]) CM/SW Contact  Geni Bers, RN Phone Number: 12/08/2019, 10:40 AM  Clinical Narrative:     Pt will not need HH O2.   Expected Discharge Plan: Home/Self Care Barriers to Discharge: No Barriers Identified  Expected Discharge Plan and Services Expected Discharge Plan: Home/Self Care       Living arrangements for the past 2 months: Single Family Home Expected Discharge Date: 12/08/19                                     Social Determinants of Health (SDOH) Interventions    Readmission Risk Interventions No flowsheet data found.

## 2019-12-12 ENCOUNTER — Ambulatory Visit: Payer: 59 | Admitting: Internal Medicine

## 2019-12-21 ENCOUNTER — Other Ambulatory Visit: Payer: Self-pay | Admitting: Emergency Medicine

## 2019-12-21 DIAGNOSIS — E1165 Type 2 diabetes mellitus with hyperglycemia: Secondary | ICD-10-CM

## 2019-12-23 ENCOUNTER — Other Ambulatory Visit: Payer: Self-pay | Admitting: Internal Medicine

## 2019-12-23 DIAGNOSIS — E1165 Type 2 diabetes mellitus with hyperglycemia: Secondary | ICD-10-CM

## 2019-12-23 MED ORDER — INSULIN LISPRO PROT & LISPRO (75-25 MIX) 100 UNIT/ML KWIKPEN: 26.0000 [IU] | PEN_INJECTOR | Freq: Two times a day (BID) | SUBCUTANEOUS | 0 refills | Status: DC

## 2019-12-23 MED ORDER — LIRAGLUTIDE 18 MG/3ML ~~LOC~~ SOPN: 1.2000 mg | PEN_INJECTOR | Freq: Every day | SUBCUTANEOUS | 0 refills | Status: DC

## 2019-12-23 MED ORDER — INSULIN PEN NEEDLE 31G X 5 MM MISC: 0 refills | Status: DC

## 2019-12-23 NOTE — Telephone Encounter (Signed)
Medication Refill Request  Did you call your pharmacy and request this refill first? Yes  . If patient has not contacted pharmacy first, instruct them to do so for future refills.  . Remind them that contacting the pharmacy for their refill is the quickest method to get the refill.  . Refill policy also stated that it will take anywhere between 24-72 hours to receive the refill.    Name of medication? Victoza, humalog, and insulin pen needles  Is this a 90 day supply? yes  Name and location of pharmacy?  [facility] [address] [place] - [address]  [address] [place] [address]  Phone:  [phone] Fax:  [phone]

## 2019-12-23 NOTE — Telephone Encounter (Signed)
Enough refills sent to pharmacy to get patient through until next appointment.

## 2019-12-27 ENCOUNTER — Ambulatory Visit: Payer: 59 | Admitting: Emergency Medicine

## 2019-12-28 ENCOUNTER — Ambulatory Visit (INDEPENDENT_AMBULATORY_CARE_PROVIDER_SITE_OTHER): Payer: 59 | Admitting: Internal Medicine

## 2019-12-28 ENCOUNTER — Ambulatory Visit: Payer: 59 | Admitting: Emergency Medicine

## 2019-12-28 ENCOUNTER — Encounter: Payer: Self-pay | Admitting: Internal Medicine

## 2019-12-28 ENCOUNTER — Other Ambulatory Visit: Payer: Self-pay

## 2019-12-28 ENCOUNTER — Encounter: Payer: Self-pay | Admitting: Emergency Medicine

## 2019-12-28 VITALS — BP 134/85 | HR 92 | Temp 97.9°F | Ht 69.0 in | Wt 237.4 lb

## 2019-12-28 VITALS — BP 144/92 | HR 84 | Ht 69.0 in | Wt 232.8 lb

## 2019-12-28 DIAGNOSIS — E785 Hyperlipidemia, unspecified: Secondary | ICD-10-CM

## 2019-12-28 DIAGNOSIS — E113413 Type 2 diabetes mellitus with severe nonproliferative diabetic retinopathy with macular edema, bilateral: Secondary | ICD-10-CM | POA: Diagnosis not present

## 2019-12-28 DIAGNOSIS — E1165 Type 2 diabetes mellitus with hyperglycemia: Secondary | ICD-10-CM

## 2019-12-28 DIAGNOSIS — Z794 Long term (current) use of insulin: Secondary | ICD-10-CM

## 2019-12-28 DIAGNOSIS — Z6835 Body mass index (BMI) 35.0-35.9, adult: Secondary | ICD-10-CM | POA: Diagnosis not present

## 2019-12-28 DIAGNOSIS — Z8616 Personal history of COVID-19: Secondary | ICD-10-CM

## 2019-12-28 DIAGNOSIS — J1282 Pneumonia due to coronavirus disease 2019: Secondary | ICD-10-CM

## 2019-12-28 DIAGNOSIS — E1169 Type 2 diabetes mellitus with other specified complication: Secondary | ICD-10-CM | POA: Diagnosis not present

## 2019-12-28 DIAGNOSIS — U071 COVID-19: Secondary | ICD-10-CM

## 2019-12-28 DIAGNOSIS — Z09 Encounter for follow-up examination after completed treatment for conditions other than malignant neoplasm: Secondary | ICD-10-CM

## 2019-12-28 LAB — POCT GLUCOSE (DEVICE FOR HOME USE): Glucose Fasting, POC: 252 mg/dL — AB (ref 70–99)

## 2019-12-28 MED ORDER — ATORVASTATIN CALCIUM 20 MG PO TABS: 20.0000 mg | ORAL_TABLET | Freq: Every day | ORAL | 3 refills | Status: DC

## 2019-12-28 NOTE — Progress Notes (Signed)
Aaron Woods 34 y.o.   Chief Complaint  Patient presents with  . Hospitalization Follow-up    Covid F/u ... feeling better (just saw dm dr this am)   ASSESSMENT / PLAN / RECOMMENDATIONS:   1) ) Type2Diabetes Mellitus, Poorlycontrolled, With retinopathic complications - Most recent A1c of8.8%. Goal A1c <7.0%.    -His A1c is now from 11.0% -Patient had Woods steroid injection for symptoms of possible Covid, he attributes his recent hyperglycemia to this.  I have encouraged him to contact us with hypoglycemia so we can adjust his insulin appropriately. - We again discussed add-on therapy with SGLT-2 inhibitors but the pt declined at this time  - He is intolerant to metformin    MEDICATIONS: -Increase Humalog  Mix 32 units With Breakfast and  with Supper  - Continue Victoza 1.2 mg daily    EDUCATION / INSTRUCTIONS:  BG monitoring instructions: Patient is instructed to check his blood sugars 2 times Woods day, before breakfast and supper.  Call [facility] if: BG persistently < 70   I reviewed the Rule of 15 for the treatment of hypoglycemia in detail with the patient. Literature supplied.    2) Diabetic complications:   Eye: Does have known diabetic retinopathy.  Patient urged to contact his ophthalmologist for Woods follow-up as he has not been seen in 10 months.  Neuro/ Feet: Does not have known diabetic peripheral neuropathy .   Renal: Patient does not have known baseline CKD. He   is not on an ACEI/ARB at present.   3) Dyslipidemia: We switched crestor to lipitor due to c/o headaches with crestor  but today he tells me he doesn't take Lipitor either . Pt attributes headaches to metformin as well., We again discussed the cardiovascular benefits of statins. - I have again encouraged him to restart lipitor    Medication Atorvastatin 20 mg daily    F/U in 6 months    Signed electronically by: Aaron Guise, MD  [facility]  [facility]  HISTORY OF PRESENT ILLNESS: This is Woods 34 y.o. male here for follow-up of hospital admission.  Admitted on 12/01/2019 with Covid pneumonia and discharged on 12/08/2019. Recovering well and slowly improving.  Has no complaints or medical concerns today. Diabetic.  Seen by his endocrinologist earlier today.  Office visit note reviewed.  See above. Lab Results  Component Value Date   HGBA1C 8.8 (H) 12/01/2019    Physician Discharge Summary  Aaron Woods:[id number] DOB: Aaron Woods DOA: 12/01/2019  PCP: Aaron Pollen, MD  Admit date: 12/01/2019 Discharge date: 12/08/2019  Admitted From: Home Disposition: Home  Recommendations for Outpatient Follow-up:  1. Follow up with PCP in 1-2 weeks 2. Follow-up with your PCP and highly recommend you take COVID-19 vaccination once you improved from current symptoms.  Home Health: Not applicable Equipment/Devices: Not applicable  Discharge Condition: Stable CODE STATUS: Full code Diet recommendation: Low-carb diet  Discharge summary: Aaron Woods 34 year old with past medical history of insulin-dependent diabetes mellitus, hyperlipidemia, obesity presented to the emergency room on 9/9 with cough and shortness of breath. Patient had recently tested positive for COVID-19 on 9/5. He is unvaccinated. Noted to be hypoxic with oxygen saturations in the 70s. Patient was admitted to the hospital and treated with  Covid directed therapy with remdesivir for 5 days, he was on baricitinib day 8 today, high-dose Solu-Medrol and other supportive treatment.  Patient did good clinical recovery and currently most of the  symptoms have improved.  Patient is on room air today.  Ambulated with no supplemental oxygen needed.  Mostly asymptomatic today.  With elevated D-dimer, had CTA of the chest and duplexes of the lower extremities they were negative for presence of thromboembolism.  Plan: Already  received 7 days of steroids, symptoms improved, discontinue all the steroids. Baricitinib received for 8 days, now on room air so will discontinue. Discharging home with supportive treatment. Resume all home insulin regimen. Strongly recommended to take COVID-19 vaccination. Due to moderate to severe symptoms and hospitalization, total isolation duration 3 weeks from first positive test.  Discharge Diagnoses:  Principal Problem:   Pneumonia due to COVID-19 virus Active Problems:   Uncontrolled type 2 diabetes mellitus with hyperglycemia (HCC)   Acute respiratory failure with hypoxia (HCC)   Sepsis (HCC)   Microcytic anemia   Obesity (BMI 30.0-34.9)    HPI   Prior to Admission medications   Medication Sig Start Date End Date Taking? Authorizing Provider  acetaminophen (TYLENOL) 325 MG tablet Take 650 mg by mouth every 6 (six) hours as needed for mild pain or headache.   Yes [provider]  atorvastatin (LIPITOR) 20 MG tablet Take 1 tablet (20 mg total) by mouth daily. 12/28/19  Yes Aaron Woods, Aaron Crazier, MD  blood glucose meter kit and supplies by Other route as directed. Dispense based on patient and insurance preference. Use up to four times daily as directed. (FOR ICD-10 E10.9, E11.9).   Yes [provider]  Insulin Lispro Prot & Lispro (HUMALOG MIX 75/25 KWIKPEN) (75-25) 100 UNIT/ML Kwikpen Inject 26 Units into the skin 2 (two) times daily with Woods meal. 12/23/19  Yes Aaron Woods, Aaron Crazier, MD  Insulin Pen Needle 31G X 5 MM MISC Use 1-4 times daily as directed. 12/23/19  Yes Aaron Woods, Aaron Crazier, MD  liraglutide (VICTOZA) 18 MG/3ML SOPN Inject 1.2 mg into the skin daily. 12/23/19  Yes Aaron Woods, Aaron Crazier, MD    No Known Allergies  Patient Active Problem List   Diagnosis Date Noted  . Microcytic anemia 12/01/2019  . Obesity (BMI 30.0-34.9) 12/01/2019  . 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 Dyslipidemia 01/04/2019  . Dyslipidemia associated with type 2 diabetes mellitus ([place]) 11/30/2018  . Uncontrolled type 2 diabetes mellitus with hyperglycemia ([place]) 11/02/2018    Past Medical History:  Diagnosis Date  . Diabetes mellitus     No past surgical history on file.  Social History   Socioeconomic History  . Marital status: Married    Spouse name: Not on file  . Number of children: Not on file  . Years of education: Not on file  . Highest education level: Not on file  Occupational History  . Not on file  Tobacco Use  . Smoking status: Never Smoker  . Smokeless tobacco: Never Used  Substance and Sexual Activity  . Alcohol use: No  . Drug use: No  . Sexual activity: Not on file  Other Topics Concern  . Not on file  Social History Narrative  . Not on file   Social Determinants of Health   Financial Resource Strain:   . Difficulty of Paying Living Expenses: Not on file  Food Insecurity:   . Worried About Charity fundraiser in the Last Year: Not on file  . Ran Out of Food in the Last Year: Not on file  Transportation Needs:   .  Lack of Transportation (Medical): Not on file  . Lack of Transportation (Non-Medical): Not on file  Physical Activity:   . Days of Exercise per Week: Not on file  . Minutes of Exercise per Session: Not on file  Stress:   . Feeling of Stress : Not on file  Social Connections:   . Frequency of Communication with Friends and Family: Not on file  . Frequency of Social Gatherings with Friends and Family: Not on file  . Attends Religious Services: Not on file  . Active Member of Clubs or Organizations: Not on file  . Attends Archivist Meetings: Not on file  . Marital Status: Not on file  Intimate Partner Violence:   . Fear of Current or Ex-Partner: Not on file  . Emotionally Abused: Not on file  . Physically Abused: Not on file  .  Sexually Abused: Not on file    Family History  Problem Relation Age of Onset  . Diabetes Mother      Review of Systems  Constitutional: Negative.  Negative for chills and fever.  HENT: Negative.  Negative for congestion and sore throat.   Respiratory: Negative.  Negative for cough and shortness of breath.   Cardiovascular: Negative.  Negative for chest pain and palpitations.  Gastrointestinal: Negative.  Negative for abdominal pain, diarrhea, nausea and vomiting.  Genitourinary: Negative.  Negative for dysuria and hematuria.  Musculoskeletal: Negative.  Negative for back pain, myalgias and neck pain.  Skin: Negative.  Negative for rash.  Neurological: Negative.  Negative for dizziness and headaches.  Endo/Heme/Allergies: Negative.   All other systems reviewed and are negative.  Today's Vitals   12/28/19 1508  BP: 134/85  Pulse: 92  Temp: 97.9 F (36.6 C)  TempSrc: Temporal  SpO2: 94%  Weight: 237 lb 6.4 oz (107.7 kg)  Height: _0  (1.753 m)   Body mass index is 35.06 kg/m.   Physical Exam Vitals reviewed.  Constitutional:      Appearance: Normal appearance.  HENT:     Head: Normocephalic.  Eyes:     Extraocular Movements: Extraocular movements intact.     Conjunctiva/sclera: Conjunctivae normal.     Pupils: Pupils are equal, round, and reactive to light.  Cardiovascular:     Rate and Rhythm: Normal rate and regular rhythm.     Pulses: Normal pulses.     Heart sounds: Normal heart sounds.  Pulmonary:     Effort: Pulmonary effort is normal.     Breath sounds: Normal breath sounds.  Musculoskeletal:        General: Normal range of motion.     Cervical back: Normal range of motion and neck supple.  Skin:    General: Skin is warm and dry.     Capillary Refill: Capillary refill takes less than 2 seconds.  Neurological:     General: No focal deficit present.     Mental Status: He is alert and oriented to person, place, and time.  Psychiatric:        Mood and  Affect: Mood normal.        Behavior: Behavior normal.      ASSESSMENT & PLAN: Clinically stable.  No medical concerns identified during this visit.  Continue present medications.  No changes. Follow-up in 3 to 6 months. Fares was seen today for hospitalization follow-up.  Diagnoses and all orders for this visit:  Hospital discharge follow-up  Body mass index (BMI) of 35.0-35.9 in adult  Dyslipidemia associated with type 2  diabetes mellitus ([place])  Uncontrolled type 2 diabetes mellitus with hyperglycemia ([place])  Pneumonia due to COVID-19 virus Comments: Much improved    Patient Instructions       If you have lab work done today you will be contacted with your lab results within the next 2 weeks.  If you have not heard from [place] then please contact us. The fastest way to get your results is to register for My Chart.   IF you received an x-ray today, you will receive an invoice from [facility]. Please contact [facility] at [phone] with questions or concerns regarding your invoice.   IF you received labwork today, you will receive an invoice from [facility] . Please contact [facility] at [phone] with questions or concerns regarding your invoice.   Our billing staff will not be able to assist you with questions regarding bills from these companies.  You will be contacted with the lab results as soon as they are available. The fastest way to get your results is to activate your My Chart account. Instructions are located on the last page of this paperwork. If you have not heard from [place] regarding the results in 2 weeks, please contact this office.      Health Maintenance, Male Adopting Woods healthy lifestyle and getting preventive care are important in promoting health and wellness. Ask your health care provider about:  The right schedule for you to have regular tests and exams.  Things you can do on your own to prevent diseases and keep yourself  healthy. What should I know about diet, weight, and exercise? Eat Woods healthy diet   Eat Woods diet that includes plenty of vegetables, fruits, low-fat dairy products, and lean protein.  Do not eat Woods lot of foods that are high in solid fats, added sugars, or sodium. Maintain Woods healthy weight Body mass index (BMI) is Woods measurement that can be used to identify possible weight problems. It estimates body fat based on height and weight. Your health care provider can help determine your BMI and help you achieve or maintain Woods healthy weight. Get regular exercise Get regular exercise. This is one of the most important things you can do for your health. Most adults should:  Exercise for at least 150 minutes each week. The exercise should increase your heart rate and make you sweat (moderate-intensity exercise).  Do strengthening exercises at least twice Woods week. This is in addition to the moderate-intensity exercise.  Spend less time sitting. Even light physical activity can be beneficial. Watch cholesterol and blood lipids Have your blood tested for lipids and cholesterol at 36 years of age, then have this test every 5 years. You may need to have your cholesterol levels checked more often if:  Your lipid or cholesterol levels are high.  You are older than 36 years of age.  You are at high risk for heart disease. What should I know about cancer screening? Many types of cancers can be detected early and may often be prevented. Depending on your health history and family history, you may need to have cancer screening at various ages. This may include screening for:  Colorectal cancer.  Prostate cancer.  Skin cancer.  Lung cancer. What should I know about heart disease, diabetes, and high blood pressure? Blood pressure and heart disease  High blood pressure causes heart disease and increases the risk of stroke. This is more likely to develop in people who have high blood pressure readings, are  of African descent,  or are overweight.  Talk with your health care provider about your target blood pressure readings.  Have your blood pressure checked: ? Every 3-5 years if you are 99-17 years of age. ? Every year if you are 58 years old or older.  If you are between the ages of 61 and 48 and are Woods current or former smoker, ask your health care provider if you should have Woods one-time screening for abdominal aortic aneurysm (AAA). Diabetes Have regular diabetes screenings. This checks your fasting blood sugar level. Have the screening done:  Once every three years after age 56 if you are at Woods normal weight and have Woods low risk for diabetes.  More often and at Woods younger age if you are overweight or have Woods high risk for diabetes. What should I know about preventing infection? Hepatitis B If you have Woods higher risk for hepatitis B, you should be screened for this virus. Talk with your health care provider to find out if you are at risk for hepatitis B infection. Hepatitis C Blood testing is recommended for:  Everyone born from [birth year] through 1967.  Anyone with known risk factors for hepatitis C. Sexually transmitted infections (STIs)  You should be screened each year for STIs, including gonorrhea and chlamydia, if: ? You are sexually active and are younger than 35 years of age. ? You are older than 36 years of age and your health care provider tells you that you are at risk for this type of infection. ? Your sexual activity has changed since you were last screened, and you are at increased risk for chlamydia or gonorrhea. Ask your health care provider if you are at risk.  Ask your health care provider about whether you are at high risk for HIV. Your health care provider may recommend Woods prescription medicine to help prevent HIV infection. If you choose to take medicine to prevent HIV, you should first get tested for HIV. You should then be tested every 3 months for as long as you are taking  the medicine. Follow these instructions at home: Lifestyle  Do not use any products that contain nicotine or tobacco, such as cigarettes, e-cigarettes, and chewing tobacco. If you need help quitting, ask your health care provider.  Do not use street drugs.  Do not share needles.  Ask your health care provider for help if you need support or information about quitting drugs. Alcohol use  Do not drink alcohol if your health care provider tells you not to drink.  If you drink alcohol: ? Limit how much you have to 0-2 drinks Woods day. ? Be aware of how much alcohol is in your drink. In the [place] one drink equals one 12 oz bottle of beer (355 mL), one 5 oz glass of wine (148 mL), or one 1 oz glass of hard liquor (44 mL). General instructions  Schedule regular health, dental, and eye exams.  Stay current with your vaccines.  Tell your health care provider if: ? You often feel depressed. ? You have ever been abused or do not feel safe at home. Summary  Adopting Woods healthy lifestyle and getting preventive care are important in promoting health and wellness.  Follow your health care provider's instructions about healthy diet, exercising, and getting tested or screened for diseases.  Follow your health care provider's instructions on monitoring your cholesterol and blood pressure. This information is not intended to replace advice given to you by your health care provider. Make sure you  discuss any questions you have with your health care provider. Document Revised: 03/03/2018 Document Reviewed: 03/03/2018 Elsevier Patient Education  2020 Elsevier Inc.     Agustina Caroli, MD [facility]

## 2019-12-28 NOTE — Progress Notes (Signed)
Name: Aaron Woods  Age/ Sex: 34 y.o., male   MRN/ DOB: [id number], 06/08/1985     PCP: Horald Pollen, MD   Reason for Endocrinology Evaluation: Type 2 Diabetes Mellitus  Initial Endocrine Consultative Visit: 01/04/2019    PATIENT IDENTIFIER: Aaron Woods is a 34 y.o. male with a past medical history of T2DM and Dyslipidemia. The patient has followed with Endocrinology clinic since 01/04/2019 for consultative assistance with management of his diabetes.  DIABETIC HISTORY:  Aaron Woods was diagnosed with T2DM in 2006. Pt intolerant to BID dosing of Metformin. His hemoglobin A1c has ranged from 7.0 % , peaking at 13.4% in 2020.  On his initial visit to our clinic his A1c was 13.4%. He was on Levemir, victoza and Metformin. We replaced Levemir with Novolog Mix. He stopped Metformin by 06/2019 due to intolerance issues    Lives with wife , 3 kids (12,10,8)   Works in park enforcement   SUBJECTIVE:   During the last visit (07/19/2019): A1c 11.0%. Continued Victoza,  and humalog mix.   Today (12/28/2019): Aaron Woods  is here for a follow up on diabetes management.   He checks his blood sugars 1  times daily, preprandial to breakfast . The patient has not had hypoglycemic episodes .  The patient did not bring his meter today.     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 Idolina Primer)  Denies:  neuropathy and CKD   Last eye exam: Completed 04/14/2019  Macrovascular complications:    Denies: CAD, PVD, CVA    HISTORY:  Past Medical History:  Past Medical History:  Diagnosis Date  . Diabetes mellitus     Past Surgical History: No past surgical history on file.  Social History:  reports that he has never  smoked. He has never used smokeless tobacco. He reports that he does not drink alcohol and does not use drugs. Family History:  Family History  Problem Relation Age of Onset  . Diabetes Mother      HOME MEDICATIONS: Allergies as of 12/28/2019   No Known Allergies     Medication List       Accurate as of December 28, 2019  7:36 AM. If you have any questions, ask your nurse or doctor.        acetaminophen 325 MG tablet Commonly known as: TYLENOL Take 650 mg by mouth every 6 (six) hours as needed for mild pain or headache.   blood glucose meter kit and supplies by Other route as directed. Dispense based on patient and insurance preference. Use up to four times daily as directed. (FOR ICD-10 E10.9, E11.9).   Insulin Lispro Prot & Lispro (75-25) 100 UNIT/ML Kwikpen Commonly known as: HumaLOG Mix 75/25 KwikPen Inject 26 Units into the skin 2 (two) times daily with a meal.   Insulin Pen Needle 31G X 5 MM Misc Use 1-4 times daily as directed.   liraglutide 18 MG/3ML Sopn Commonly known as: VICTOZA Inject 1.2 mg into the skin daily.        OBJECTIVE:   Vital Signs: BP (!) 144/92 (BP Location: Left Arm, Patient Position: Sitting, Cuff Size: Normal)   Pulse 84   Ht 5' 9"  (1.753 m)  Wt 232 lb 12.8 oz (105.6 kg)   SpO2 96%   BMI 34.38 kg/m   Wt Readings from Last 3 Encounters:  12/28/19 232 lb 12.8 oz (105.6 kg)  12/01/19 235 lb (106.6 kg)  07/19/19 233 lb 9.6 oz (106 kg)     Exam: General: Pt appears well and is in NAD  Lungs: Clear with good BS bilat with no rales, rhonchi, or wheezes  Heart: RRR with normal S1 and S2 and no gallops; no murmurs; no rub  Abdomen:  soft, nontender, without masses or organomegaly palpable  Extremities: Trace pretibial edema.   Neuro: MS is good with appropriate affect, pt is alert and Ox3    DM foot exam: 12/28/2019  The skin of the feet is intact without sores or ulcerations. The pedal pulses are 2+ on right and 2+ on left. The  sensation is intact to a screening 5.07, 10 gram monofilament bilaterally       DATA REVIEWED:  Lab Results  Component Value Date   HGBA1C 8.8 (H) 12/01/2019   HGBA1C 11.0 (A) 07/19/2019   HGBA1C 11.3 (A) 04/20/2019   Lab Results  Component Value Date   MICROALBUR 0.50 04/11/2009   LDLCALC 151 (H) 11/02/2018   CREATININE 0.90 12/07/2019    Lab Results  Component Value Date   CHOL 210 (H) 11/02/2018   HDL 42 11/02/2018   LDLCALC 151 (H) 11/02/2018   TRIG 81 12/01/2019   CHOLHDL 5.0 11/02/2018        In-office BG was 252 mg/dL (postprandial) ASSESSMENT / PLAN / RECOMMENDATIONS:   1) ) Type 2 Diabetes Mellitus, Poorly controlled, With retinopathic complications - Most recent A1c of 8.8 %. Goal A1c < 7.0 %.     -His A1c is now from 11.0% -Patient had a steroid injection for symptoms of possible Covid, he attributes his recent hyperglycemia to this.  I have encouraged him to contact us with hypoglycemia so we can adjust his insulin appropriately. - We again discussed add-on therapy with SGLT-2 inhibitors but the pt declined at this time  - He is intolerant to metformin    MEDICATIONS: -Increase Humalog  Mix 32 units With Breakfast and  with Supper  - Continue Victoza 1.2 mg daily    EDUCATION / INSTRUCTIONS:  BG monitoring instructions: Patient is instructed to check his blood sugars 2 times a day, before breakfast and supper.  Call [facility] if: BG persistently < 70  . I reviewed the Rule of 15 for the treatment of hypoglycemia in detail with the patient. Literature supplied.    2) Diabetic complications:   Eye: Does have known diabetic retinopathy.  Patient urged to contact his ophthalmologist for a follow-up as he has not been seen in 10 months.  Neuro/ Feet: Does not have known diabetic peripheral neuropathy .   Renal: Patient does not have known baseline CKD. He   is not on an ACEI/ARB at present.   3) Dyslipidemia: We switched crestor  to lipitor due to c/o headaches with crestor  but today he tells me he doesn't take Lipitor either . Pt attributes headaches to metformin as well., We again discussed the cardiovascular benefits of statins. - I have again encouraged him to restart lipitor    Medication Atorvastatin 20 mg daily    F/U in 6 months    Signed electronically by: Mack Guise, MD  [facility]  [facility] [address], [place], [place] [address] Phone:  [phone] FAX: [phone]   CC: Horald Pollen, MD [place] [address] Phone: [phone]  Fax: [phone]  Return to Endocrinology clinic as below: Future Appointments  Date Time Provider [place]  12/28/2019  3:00 PM Horald Pollen, MD PCP-PCP [facility]

## 2019-12-28 NOTE — Patient Instructions (Addendum)
   If you have lab work done today you will be contacted with your lab results within the next 2 weeks.  If you have not heard from us then please contact us. The fastest way to get your results is to register for My Chart.   IF you received an x-ray today, you will receive an invoice from [facility]. Please contact [facility] at [phone] with questions or concerns regarding your invoice.   IF you received labwork today, you will receive an invoice from [facility]. Please contact [facility] at [phone] with questions or concerns regarding your invoice.   Our billing staff will not be able to assist you with questions regarding bills from these companies.  You will be contacted with the lab results as soon as they are available. The fastest way to get your results is to activate your My Chart account. Instructions are located on the last page of this paperwork. If you have not heard from us regarding the results in 2 weeks, please contact this office.      Health Maintenance, Male Adopting a healthy lifestyle and getting preventive care are important in promoting health and wellness. Ask your health care provider about:  The right schedule for you to have regular tests and exams.  Things you can do on your own to prevent diseases and keep yourself healthy. What should I know about diet, weight, and exercise? Eat a healthy diet   Eat a diet that includes plenty of vegetables, fruits, low-fat dairy products, and lean protein.  Do not eat a lot of foods that are high in solid fats, added sugars, or sodium. Maintain a healthy weight Body mass index (BMI) is a measurement that can be used to identify possible weight problems. It estimates body fat based on height and weight. Your health care provider can help determine your BMI and help you achieve or maintain a healthy weight. Get regular exercise Get regular exercise. This is one of the most important things you  can do for your health. Most adults should:  Exercise for at least 150 minutes each week. The exercise should increase your heart rate and make you sweat (moderate-intensity exercise).  Do strengthening exercises at least twice a week. This is in addition to the moderate-intensity exercise.  Spend less time sitting. Even light physical activity can be beneficial. Watch cholesterol and blood lipids Have your blood tested for lipids and cholesterol at 36 years of age, then have this test every 5 years. You may need to have your cholesterol levels checked more often if:  Your lipid or cholesterol levels are high.  You are older than 36 years of age.  You are at high risk for heart disease. What should I know about cancer screening? Many types of cancers can be detected early and may often be prevented. Depending on your health history and family history, you may need to have cancer screening at various ages. This may include screening for:  Colorectal cancer.  Prostate cancer.  Skin cancer.  Lung cancer. What should I know about heart disease, diabetes, and high blood pressure? Blood pressure and heart disease  High blood pressure causes heart disease and increases the risk of stroke. This is more likely to develop in people who have high blood pressure readings, are of African descent, or are overweight.  Talk with your health care provider about your target blood pressure readings.  Have your blood pressure checked: ? Every 3-5 years if you are 18-39   years of age. ? Every year if you are 40 years old or older.  If you are between the ages of 65 and 75 and are a current or former smoker, ask your health care provider if you should have a one-time screening for abdominal aortic aneurysm (AAA). Diabetes Have regular diabetes screenings. This checks your fasting blood sugar level. Have the screening done:  Once every three years after age 45 if you are at a normal weight and have  a low risk for diabetes.  More often and at a younger age if you are overweight or have a high risk for diabetes. What should I know about preventing infection? Hepatitis B If you have a higher risk for hepatitis B, you should be screened for this virus. Talk with your health care provider to find out if you are at risk for hepatitis B infection. Hepatitis C Blood testing is recommended for:  Everyone born from 1945 through 1965.  Anyone with known risk factors for hepatitis C. Sexually transmitted infections (STIs)  You should be screened each year for STIs, including gonorrhea and chlamydia, if: ? You are sexually active and are younger than 36 years of age. ? You are older than 36 years of age and your health care provider tells you that you are at risk for this type of infection. ? Your sexual activity has changed since you were last screened, and you are at increased risk for chlamydia or gonorrhea. Ask your health care provider if you are at risk.  Ask your health care provider about whether you are at high risk for HIV. Your health care provider may recommend a prescription medicine to help prevent HIV infection. If you choose to take medicine to prevent HIV, you should first get tested for HIV. You should then be tested every 3 months for as long as you are taking the medicine. Follow these instructions at home: Lifestyle  Do not use any products that contain nicotine or tobacco, such as cigarettes, e-cigarettes, and chewing tobacco. If you need help quitting, ask your health care provider.  Do not use street drugs.  Do not share needles.  Ask your health care provider for help if you need support or information about quitting drugs. Alcohol use  Do not drink alcohol if your health care provider tells you not to drink.  If you drink alcohol: ? Limit how much you have to 0-2 drinks a day. ? Be aware of how much alcohol is in your drink. In the [place] one drink equals one 12  oz bottle of beer (355 mL), one 5 oz glass of wine (148 mL), or one 1 oz glass of hard liquor (44 mL). General instructions  Schedule regular health, dental, and eye exams.  Stay current with your vaccines.  Tell your health care provider if: ? You often feel depressed. ? You have ever been abused or do not feel safe at home. Summary  Adopting a healthy lifestyle and getting preventive care are important in promoting health and wellness.  Follow your health care provider's instructions about healthy diet, exercising, and getting tested or screened for diseases.  Follow your health care provider's instructions on monitoring your cholesterol and blood pressure. This information is not intended to replace advice given to you by your health care provider. Make sure you discuss any questions you have with your health care provider. Document Revised: 03/03/2018 Document Reviewed: 03/03/2018 Elsevier Patient Education  2020 [employer]vier Inc.  

## 2019-12-28 NOTE — Patient Instructions (Signed)
-   Humalog  Mix 32 units With Breakfast and 32 units with Supper  - Continue Victoza 1.2 mg daily  - Restart Atorvastatin ( Lipitor) 20 mg at bedtime ( for cholesterol)      HOW TO TREAT LOW BLOOD SUGARS (Blood sugar LESS THAN 70 MG/DL)  Please follow the RULE OF 15 for the treatment of hypoglycemia treatment (when your (blood sugars are less than 70 mg/dL)    STEP 1: Take 15 grams of carbohydrates when your blood sugar is low, which includes:   3-4 GLUCOSE TABS  OR  3-4 OZ OF JUICE OR REGULAR SODA OR  ONE TUBE OF GLUCOSE GEL     STEP 2: RECHECK blood sugar in 15 MINUTES STEP 3: If your blood sugar is still low at the 15 minute recheck --> then, go back to STEP 1 and treat AGAIN with another 15 grams of carbohydrates.

## 2020-01-16 ENCOUNTER — Other Ambulatory Visit: Payer: 59

## 2020-01-16 DIAGNOSIS — Z20822 Contact with and (suspected) exposure to covid-19: Secondary | ICD-10-CM

## 2020-01-17 LAB — NOVEL CORONAVIRUS, NAA: SARS-CoV-2, NAA: NOT DETECTED

## 2020-01-17 LAB — SARS-COV-2, NAA 2 DAY TAT

## 2020-01-23 ENCOUNTER — Other Ambulatory Visit: Payer: 59

## 2020-01-23 DIAGNOSIS — Z20822 Contact with and (suspected) exposure to covid-19: Secondary | ICD-10-CM

## 2020-01-24 LAB — SARS-COV-2, NAA 2 DAY TAT

## 2020-01-24 LAB — NOVEL CORONAVIRUS, NAA: SARS-CoV-2, NAA: NOT DETECTED

## 2020-01-30 ENCOUNTER — Other Ambulatory Visit: Payer: 59

## 2020-01-30 ENCOUNTER — Telehealth: Payer: Self-pay | Admitting: Internal Medicine

## 2020-01-30 ENCOUNTER — Other Ambulatory Visit: Payer: Self-pay | Admitting: Internal Medicine

## 2020-01-30 DIAGNOSIS — E1165 Type 2 diabetes mellitus with hyperglycemia: Secondary | ICD-10-CM

## 2020-01-30 DIAGNOSIS — Z20822 Contact with and (suspected) exposure to covid-19: Secondary | ICD-10-CM

## 2020-01-30 MED ORDER — INSULIN LISPRO PROT & LISPRO (75-25 MIX) 100 UNIT/ML KWIKPEN
32.0000 [IU] | PEN_INJECTOR | Freq: Two times a day (BID) | SUBCUTANEOUS | 3 refills | Status: DC
Start: 2020-01-30 — End: 2021-01-04

## 2020-01-30 MED ORDER — LIRAGLUTIDE 18 MG/3ML ~~LOC~~ SOPN: 1.2000 mg | PEN_INJECTOR | Freq: Every day | SUBCUTANEOUS | 11 refills | Status: DC

## 2020-01-30 NOTE — Telephone Encounter (Signed)
Please advise. Patient stated need refills. Last prescribed on 12/23/2019. Patient is taking 30 units BID of Humalog. However, in the last appointment, it stated increased to 32 units.

## 2020-01-30 NOTE — Telephone Encounter (Signed)
Please let her know to increase insulin to 32 units with breakfast and supper Both refilled     Thanks

## 2020-01-30 NOTE — Telephone Encounter (Signed)
Spoken to patient and notified Dr Shamleffer's comments. Verbalized understanding.   

## 2020-01-30 NOTE — Telephone Encounter (Signed)
Patient called re: Patient's PHARM (listed below) told Patient they have sent Dr. Lonzo Cloud RX requests for the following medications with no response from our office. PHARM told patient he needs to talk to Dr. Lonzo Cloud to request the following RX's:  Insulin Lispro Prot & Lispro (HUMALOG MIX 75/25 KWIKPEN) (75-25) 100 UNIT/ML Kwikpen (Patient states Dr. Lonzo Cloud increased dosage of Humalog to 30 units) AND liraglutide (VICTOZA) 18 MG/3ML SOPN  Be sent to: [facility] [address], [place] - [address] Phone:  [phone]  Fax:  [phone]

## 2020-01-31 LAB — SARS-COV-2, NAA 2 DAY TAT

## 2020-01-31 LAB — NOVEL CORONAVIRUS, NAA: SARS-CoV-2, NAA: NOT DETECTED

## 2020-02-06 ENCOUNTER — Other Ambulatory Visit: Payer: 59

## 2020-02-06 DIAGNOSIS — Z20822 Contact with and (suspected) exposure to covid-19: Secondary | ICD-10-CM

## 2020-02-07 LAB — SARS-COV-2, NAA 2 DAY TAT

## 2020-02-07 LAB — NOVEL CORONAVIRUS, NAA: SARS-CoV-2, NAA: NOT DETECTED

## 2020-02-13 ENCOUNTER — Other Ambulatory Visit: Payer: 59

## 2020-02-14 ENCOUNTER — Other Ambulatory Visit: Payer: 59

## 2020-02-14 DIAGNOSIS — Z20822 Contact with and (suspected) exposure to covid-19: Secondary | ICD-10-CM

## 2020-02-15 LAB — NOVEL CORONAVIRUS, NAA: SARS-CoV-2, NAA: NOT DETECTED

## 2020-02-15 LAB — SARS-COV-2, NAA 2 DAY TAT

## 2020-02-20 ENCOUNTER — Other Ambulatory Visit: Payer: 59

## 2020-02-20 DIAGNOSIS — Z20822 Contact with and (suspected) exposure to covid-19: Secondary | ICD-10-CM

## 2020-02-21 ENCOUNTER — Ambulatory Visit: Payer: 59 | Attending: Internal Medicine

## 2020-02-21 DIAGNOSIS — Z23 Encounter for immunization: Secondary | ICD-10-CM

## 2020-02-21 NOTE — Progress Notes (Signed)
   Covid-19 Vaccination Clinic  Name:  Aaron Woods    MRN: [id number] DOB: [birth year]  02/21/2020  [name] was observed post Covid-19 immunization for 15 minutes without incident. He was provided with Vaccine Information Sheet and instruction to access the V-Safe system.   [name] was instructed to call 911 with any severe reactions post vaccine: Marland Kitchen Difficulty breathing  . Swelling of face and throat  . A fast heartbeat  . A bad rash all over body  . Dizziness and weakness   Immunizations Administered    Name Date Dose VIS Date Route   Pfizer COVID-19 Vaccine 02/21/2020  3:58 PM 0.3 mL 01/11/2020 Intramuscular   Manufacturer: [employer], [employer]   Lot: [id number]   NDC: [address]-6

## 2020-02-22 LAB — SARS-COV-2, NAA 2 DAY TAT

## 2020-02-22 LAB — NOVEL CORONAVIRUS, NAA: SARS-CoV-2, NAA: NOT DETECTED

## 2020-02-27 ENCOUNTER — Other Ambulatory Visit: Payer: 59

## 2020-02-27 DIAGNOSIS — Z20822 Contact with and (suspected) exposure to covid-19: Secondary | ICD-10-CM

## 2020-02-29 LAB — SARS-COV-2, NAA 2 DAY TAT

## 2020-02-29 LAB — NOVEL CORONAVIRUS, NAA: SARS-CoV-2, NAA: NOT DETECTED

## 2020-03-05 ENCOUNTER — Other Ambulatory Visit: Payer: Self-pay | Admitting: Internal Medicine

## 2020-03-05 ENCOUNTER — Other Ambulatory Visit: Payer: 59

## 2020-03-05 DIAGNOSIS — Z20822 Contact with and (suspected) exposure to covid-19: Secondary | ICD-10-CM

## 2020-03-06 LAB — NOVEL CORONAVIRUS, NAA: SARS-CoV-2, NAA: NOT DETECTED

## 2020-03-06 LAB — SARS-COV-2, NAA 2 DAY TAT

## 2020-03-12 ENCOUNTER — Other Ambulatory Visit: Payer: 59

## 2020-03-12 DIAGNOSIS — Z20822 Contact with and (suspected) exposure to covid-19: Secondary | ICD-10-CM

## 2020-03-14 LAB — SARS-COV-2, NAA 2 DAY TAT

## 2020-03-14 LAB — NOVEL CORONAVIRUS, NAA: SARS-CoV-2, NAA: NOT DETECTED

## 2020-03-20 ENCOUNTER — Ambulatory Visit: Payer: 59 | Attending: Internal Medicine

## 2020-03-20 DIAGNOSIS — Z23 Encounter for immunization: Secondary | ICD-10-CM

## 2020-03-20 NOTE — Progress Notes (Signed)
   Covid-19 Vaccination Clinic  Name:  CANDIDO FLOTT    MRN: [id number] DOB: 03-08-1986  03/20/2020  Mr. Lamp was observed post Covid-19 immunization for 15 minutes without incident. He was provided with Vaccine Information Sheet and instruction to access the V-Safe system.   Mr. Ivery was instructed to call 911 with any severe reactions post vaccine: Marland Kitchen Difficulty breathing  . Swelling of face and throat  . A fast heartbeat  . A bad rash all over body  . Dizziness and weakness   Immunizations Administered    Name Date Dose VIS Date Route   Pfizer COVID-19 Vaccine 03/20/2020  3:09 PM 0.3 mL 01/11/2020 Intramuscular   Manufacturer: [employer], [employer]   Lot: [id number]   NDC: [address]-9

## 2020-06-28 ENCOUNTER — Ambulatory Visit: Payer: 59 | Admitting: Internal Medicine

## 2020-10-12 ENCOUNTER — Encounter: Payer: Self-pay | Admitting: Internal Medicine

## 2020-10-12 ENCOUNTER — Other Ambulatory Visit: Payer: Self-pay

## 2020-10-12 ENCOUNTER — Ambulatory Visit: Payer: 59 | Admitting: Internal Medicine

## 2020-10-12 VITALS — BP 178/118 | HR 72 | Temp 98.4°F | Resp 18 | Ht 69.0 in | Wt 257.8 lb

## 2020-10-12 DIAGNOSIS — Z794 Long term (current) use of insulin: Secondary | ICD-10-CM

## 2020-10-12 DIAGNOSIS — E113413 Type 2 diabetes mellitus with severe nonproliferative diabetic retinopathy with macular edema, bilateral: Secondary | ICD-10-CM

## 2020-10-12 DIAGNOSIS — E1169 Type 2 diabetes mellitus with other specified complication: Secondary | ICD-10-CM

## 2020-10-12 DIAGNOSIS — I10 Essential (primary) hypertension: Secondary | ICD-10-CM | POA: Diagnosis not present

## 2020-10-12 DIAGNOSIS — E785 Hyperlipidemia, unspecified: Secondary | ICD-10-CM

## 2020-10-12 DIAGNOSIS — E1159 Type 2 diabetes mellitus with other circulatory complications: Secondary | ICD-10-CM | POA: Insufficient documentation

## 2020-10-12 LAB — CBC WITH DIFFERENTIAL/PLATELET
Basophils Absolute: 0 10*3/uL (ref 0.0–0.1)
Basophils Relative: 0.7 % (ref 0.0–3.0)
Eosinophils Absolute: 0.1 10*3/uL (ref 0.0–0.7)
Eosinophils Relative: 1.8 % (ref 0.0–5.0)
HCT: 37 % — ABNORMAL LOW (ref 39.0–52.0)
Hemoglobin: 11.8 g/dL — ABNORMAL LOW (ref 13.0–17.0)
Lymphocytes Relative: 41.1 % (ref 12.0–46.0)
Lymphs Abs: 2.9 10*3/uL (ref 0.7–4.0)
MCHC: 31.8 g/dL (ref 30.0–36.0)
MCV: 76 fl — ABNORMAL LOW (ref 78.0–100.0)
Monocytes Absolute: 0.7 10*3/uL (ref 0.1–1.0)
Monocytes Relative: 9.1 % (ref 3.0–12.0)
Neutro Abs: 3.4 10*3/uL (ref 1.4–7.7)
Neutrophils Relative %: 47.3 % (ref 43.0–77.0)
Platelets: 284 10*3/uL (ref 150.0–400.0)
RBC: 4.87 Mil/uL (ref 4.22–5.81)
RDW: 14.2 % (ref 11.5–15.5)
WBC: 7.2 10*3/uL (ref 4.0–10.5)

## 2020-10-12 LAB — BASIC METABOLIC PANEL
BUN: 17 mg/dL (ref 6–23)
CO2: 32 mEq/L (ref 19–32)
Calcium: 9.1 mg/dL (ref 8.4–10.5)
Chloride: 100 mEq/L (ref 96–112)
Creatinine, Ser: 0.93 mg/dL (ref 0.40–1.50)
GFR: 105.02 mL/min (ref >60.00)
Glucose, Bld: 105 mg/dL — ABNORMAL HIGH (ref 70–99)
Potassium: 4 mEq/L (ref 3.5–5.1)
Sodium: 136 mEq/L (ref 135–145)

## 2020-10-12 LAB — MICROALBUMIN / CREATININE URINE RATIO
Creatinine,U: 117.2 mg/dL
Microalb Creat Ratio: 16.7 mg/g (ref 0.0–30.0)
Microalb, Ur: 19.5 mg/dL — ABNORMAL HIGH (ref 0.0–1.9)

## 2020-10-12 LAB — URINALYSIS, ROUTINE W REFLEX MICROSCOPIC
Bilirubin Urine: NEGATIVE
Hgb urine dipstick: NEGATIVE
Ketones, ur: NEGATIVE
Leukocytes,Ua: NEGATIVE
Nitrite: NEGATIVE
Specific Gravity, Urine: 1.015 (ref 1.000–1.030)
Urine Glucose: NEGATIVE
Urobilinogen, UA: 0.2 (ref 0.0–1.0)
pH: 7 (ref 5.0–8.0)

## 2020-10-12 LAB — LIPID PANEL
Cholesterol: 165 mg/dL (ref 0–200)
HDL: 33.6 mg/dL — ABNORMAL LOW (ref >39.00)
LDL Cholesterol: 98 mg/dL (ref 0–99)
NonHDL: 131.61
Total CHOL/HDL Ratio: 5
Triglycerides: 167 mg/dL — ABNORMAL HIGH (ref 0.0–149.0)
VLDL: 33.4 mg/dL (ref 0.0–40.0)

## 2020-10-12 LAB — HEPATIC FUNCTION PANEL
ALT: 56 U/L — ABNORMAL HIGH (ref 0–53)
AST: 31 U/L (ref 0–37)
Albumin: 3.7 g/dL (ref 3.5–5.2)
Alkaline Phosphatase: 75 U/L (ref 39–117)
Bilirubin, Direct: 0.1 mg/dL (ref 0.0–0.3)
Total Bilirubin: 0.4 mg/dL (ref 0.2–1.2)
Total Protein: 6.3 g/dL (ref 6.0–8.3)

## 2020-10-12 LAB — HEMOGLOBIN A1C: Hgb A1c MFr Bld: 9.5 % — ABNORMAL HIGH (ref 4.6–6.5)

## 2020-10-12 LAB — TSH: TSH: 2.58 u[IU]/mL (ref 0.35–5.50)

## 2020-10-12 MED ORDER — TELMISARTAN-HCTZ 40-12.5 MG PO TABS: 1.0000 | ORAL_TABLET | Freq: Every day | ORAL | 3 refills | Status: DC

## 2020-10-12 MED ORDER — ROSUVASTATIN CALCIUM 20 MG PO TABS: 20.0000 mg | ORAL_TABLET | Freq: Every day | ORAL | 3 refills | Status: DC

## 2020-10-12 NOTE — Progress Notes (Signed)
Chief Complaint: follow up HTN uncontrolled recently with wife, hld, DM       HPI:  Aaron Woods is a 35 y.o. male here with c/o drinking more fluids recently to try to help effect wt loss and seems to be retaining fluid per wife all over; pt denies symptoms, Pt denies chest pain, increased sob or doe, wheezing, orthopnea, PND, palpitations, dizziness or syncope.  Pt has hx of covid pneumonia hospd for several days 2021 but puffiness all over only started recently.  Has been trying to follow lower cholesterol diet, has been intolerant of lipitor with HA and constipation, but willing to try different statin.  Has been only very recently lost to f/u with endo for DM as missed his appt April 2022.   Pt denies polydipsia, polyuria, or new focal neuro s/s.   Pt denies fever, wt loss, night sweats, loss of appetite, or other constitutional symptoms          Wt Readings from Last 3 Encounters:  10/12/20 257 lb 12.8 oz (116.9 kg)  12/28/19 237 lb 6.4 oz (107.7 kg)  12/28/19 232 lb 12.8 oz (105.6 kg)   BP Readings from Last 3 Encounters:  10/12/20 (!) 178/118  12/28/19 134/85  12/28/19 (!) 144/92         Past Medical History:  Diagnosis Date   Diabetes mellitus    History reviewed. No pertinent surgical history.  reports that he has never smoked. He has never used smokeless tobacco. He reports that he does not drink alcohol and does not use drugs. family history includes Diabetes in his mother. No Known Allergies Current Outpatient Medications on File Prior to Visit  Medication Sig Dispense Refill   blood glucose meter kit and supplies by Other route as directed. Dispense based on patient and insurance preference. Use up to four times daily as directed. (FOR ICD-10 E10.9, E11.9).     Insulin Lispro Prot & Lispro (HUMALOG MIX 75/25 KWIKPEN) (75-25) 100 UNIT/ML Kwikpen Inject 32 Units into the skin 2 (two) times daily with a meal. 60 mL 3   Insulin Pen Needle (B-D UF III MINI PEN NEEDLES)  31G X 5 MM MISC USE 1 PEN NEEDLE WITH INSULIN PEN ONCE TO FOUR TIMES DAILY AS DIRECTED 100 each 5   liraglutide (VICTOZA) 18 MG/3ML SOPN Inject 1.2 mg into the skin daily. 9 mL 11   acetaminophen (TYLENOL) 325 MG tablet Take 650 mg by mouth every 6 (six) hours as needed for mild pain or headache. (Patient not taking: Reported on 10/12/2020)     No current facility-administered medications on file prior to visit.        ROS:  All others reviewed and negative.  Objective        PE:  BP (!) 178/118   Pulse 72   Temp 98.4 F (36.9 C) (Oral)   Resp 18   Ht 5' 9"  (1.753 m)   Wt 257 lb 12.8 oz (116.9 kg)   SpO2 96%   BMI 38.07 kg/m                 Constitutional: Pt appears in NAD               HENT: Head: NCAT.                Right Ear: External ear normal.                 Left Ear:  External ear normal.                Eyes: . Pupils are equal, round, and reactive to light. Conjunctivae and EOM are normal               Nose: without d/c or deformity               Neck: Neck supple. Gross normal ROM               Cardiovascular: Normal rate and regular rhythm.                 Pulmonary/Chest: Effort normal and breath sounds without rales or wheezing.                Abd:  Soft, NT, ND, + BS, no organomegaly               Neurological: Pt is alert. At baseline orientation, motor grossly intact               Skin: Skin is warm. No rashes, no other new lesions, LE edema - trace bilat LEs               Psychiatric: Pt behavior is normal without agitation   Micro: none  Cardiac tracings I have personally interpreted today:  none  Pertinent Radiological findings (summarize): none   Lab Results  Component Value Date   WBC 7.2 10/12/2020   HGB 11.8 (L) 10/12/2020   HCT 37.0 (L) 10/12/2020   PLT 284.0 10/12/2020   GLUCOSE 105 (H) 10/12/2020   CHOL 165 10/12/2020   TRIG 167.0 (H) 10/12/2020   HDL 33.60 (L) 10/12/2020   LDLCALC 98 10/12/2020   ALT 56 (H) 10/12/2020   AST 31 10/12/2020    NA 136 10/12/2020   K 4.0 10/12/2020   CL 100 10/12/2020   CREATININE 0.93 10/12/2020   BUN 17 10/12/2020   CO2 32 10/12/2020   TSH 2.58 10/12/2020   HGBA1C 9.5 (H) 10/12/2020   MICROALBUR 19.5 (H) 10/12/2020   Assessment/Plan:  Aaron Woods is a 35 y.o. Black or African American [2] male with  has a past medical history of Diabetes mellitus.  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, with long-term current use of insulin ([place]) Uncontrolled at last a1c, for cont same tx for now but check a1c, and pt plans to fu with endo  Sept 9 2021 - A1c 8.8  Hypertension Mod to severe uncontrolled, for start micardis HCT, check BP at home and next visit, for bmp with labs today, f/u with PCP in 3 wks  BP Readings from Last 3 Encounters:  10/12/20 (!) 178/118  12/28/19 134/85  12/28/19 (!) 144/92    Dyslipidemia associated with type 2 diabetes mellitus ([place]) Uncontrolled, and lipitor intolerant, to start crestor 20 qd in 1 wk and continue to monitor for side effects; goal ldl < 70  Lab Results  Component Value Date   CHOL 165 10/12/2020   CHOL 210 (H) 11/02/2018   CHOL 169 12/14/2009   Lab Results  Component Value Date   HDL 33.60 (L) 10/12/2020   HDL 42 11/02/2018   HDL 42 12/14/2009   Lab Results  Component Value Date   LDLCALC 98 10/12/2020   LDLCALC 151 (H) 11/02/2018   LDLCALC 114 (H) 12/14/2009   Lab Results  Component Value Date   TRIG 167.0 (H) 10/12/2020   TRIG  81 12/01/2019   TRIG 84 11/02/2018   Lab Results  Component Value Date   CHOLHDL 5 10/12/2020   CHOLHDL 5.0 11/02/2018   CHOLHDL 4.0 Ratio 12/14/2009   No results found for: LDLDIRECT  Followup: Return in about 3 weeks (around 11/02/2020).  Cathlean Cower, MD 10/12/2020 10:42 PM [facility]

## 2020-10-12 NOTE — Patient Instructions (Signed)
Please take all new medication as prescribed - the generic micardis HCT 40-12.5 mg per day  After 5 days, check the BP twice per day, and bring with you to the next appt  Also, after 1 week, please start the generic crestor 20 mg per day  Please continue all other medications as before, and refills have been done if requested.  Please have the pharmacy call with any other refills you may need.  Please continue your efforts at being more active, low cholesterol diet, and weight control.  Please keep your appointments with your specialists as you may have planned - Dr Brooks Sailors  Please also see Dr Alvy Bimler in 3 weeks  Please go to the LAB at the blood drawing area for the tests to be done  You will be contacted by phone if any changes need to be made immediately.  Otherwise, you will receive a letter about your results with an explanation, but please check with MyChart first.  Please remember to sign up for MyChart if you have not done so, as this will be important to you in the future with finding out test results, communicating by private email, and scheduling acute appointments online when needed.

## 2020-10-12 NOTE — Assessment & Plan Note (Addendum)
Uncontrolled at last a1c, for cont same tx for now but check a1c, and pt plans to fu with endo  Sept 9 2021 - A1c 8.8

## 2020-10-12 NOTE — Assessment & Plan Note (Addendum)
Mod to severe uncontrolled, for start micardis HCT, check BP at home and next visit, for bmp with labs today, f/u with PCP in 3 wks  BP Readings from Last 3 Encounters:  10/12/20 (!) 178/118  12/28/19 134/85  12/28/19 (!) 144/92

## 2020-10-12 NOTE — Assessment & Plan Note (Addendum)
Uncontrolled, and lipitor intolerant, to start crestor 20 qd in 1 wk and continue to monitor for side effects; goal ldl < 70  Lab Results  Component Value Date   CHOL 165 10/12/2020   CHOL 210 (H) 11/02/2018   CHOL 169 12/14/2009   Lab Results  Component Value Date   HDL 33.60 (L) 10/12/2020   HDL 42 11/02/2018   HDL 42 12/14/2009   Lab Results  Component Value Date   LDLCALC 98 10/12/2020   LDLCALC 151 (H) 11/02/2018   LDLCALC 114 (H) 12/14/2009   Lab Results  Component Value Date   TRIG 167.0 (H) 10/12/2020   TRIG 81 12/01/2019   TRIG 84 11/02/2018   Lab Results  Component Value Date   CHOLHDL 5 10/12/2020   CHOLHDL 5.0 11/02/2018   CHOLHDL 4.0 Ratio 12/14/2009   No results found for: LDLDIRECT

## 2020-10-14 ENCOUNTER — Encounter: Payer: Self-pay | Admitting: Internal Medicine

## 2020-10-27 ENCOUNTER — Other Ambulatory Visit: Payer: Self-pay | Admitting: Internal Medicine

## 2020-11-05 ENCOUNTER — Ambulatory Visit: Payer: 59 | Admitting: Emergency Medicine

## 2020-11-22 ENCOUNTER — Ambulatory Visit (INDEPENDENT_AMBULATORY_CARE_PROVIDER_SITE_OTHER): Payer: 59

## 2020-11-22 ENCOUNTER — Other Ambulatory Visit: Payer: Self-pay

## 2020-11-22 ENCOUNTER — Encounter: Payer: Self-pay | Admitting: Emergency Medicine

## 2020-11-22 ENCOUNTER — Ambulatory Visit: Payer: 59 | Admitting: Emergency Medicine

## 2020-11-22 VITALS — BP 130/80 | HR 81 | Temp 98.4°F | Ht 69.0 in | Wt 255.0 lb

## 2020-11-22 DIAGNOSIS — E1159 Type 2 diabetes mellitus with other circulatory complications: Secondary | ICD-10-CM | POA: Diagnosis not present

## 2020-11-22 DIAGNOSIS — E785 Hyperlipidemia, unspecified: Secondary | ICD-10-CM

## 2020-11-22 DIAGNOSIS — E113413 Type 2 diabetes mellitus with severe nonproliferative diabetic retinopathy with macular edema, bilateral: Secondary | ICD-10-CM

## 2020-11-22 DIAGNOSIS — I152 Hypertension secondary to endocrine disorders: Secondary | ICD-10-CM

## 2020-11-22 DIAGNOSIS — S39011A Strain of muscle, fascia and tendon of abdomen, initial encounter: Secondary | ICD-10-CM

## 2020-11-22 DIAGNOSIS — R29818 Other symptoms and signs involving the nervous system: Secondary | ICD-10-CM | POA: Insufficient documentation

## 2020-11-22 DIAGNOSIS — E1169 Type 2 diabetes mellitus with other specified complication: Secondary | ICD-10-CM

## 2020-11-22 DIAGNOSIS — S39013A Strain of muscle, fascia and tendon of pelvis, initial encounter: Secondary | ICD-10-CM

## 2020-11-22 DIAGNOSIS — Z794 Long term (current) use of insulin: Secondary | ICD-10-CM

## 2020-11-22 IMAGING — DX DG HIP (WITH OR WITHOUT PELVIS) 2-3V*R*
3 series · 3 of 3 positions shown · non-contrast
Comparison: None.

CLINICAL DATA: Right hip pain for several months.

EXAM:
DG HIP (WITH OR WITHOUT PELVIS) 2-3V RIGHT

[pelvis ap]
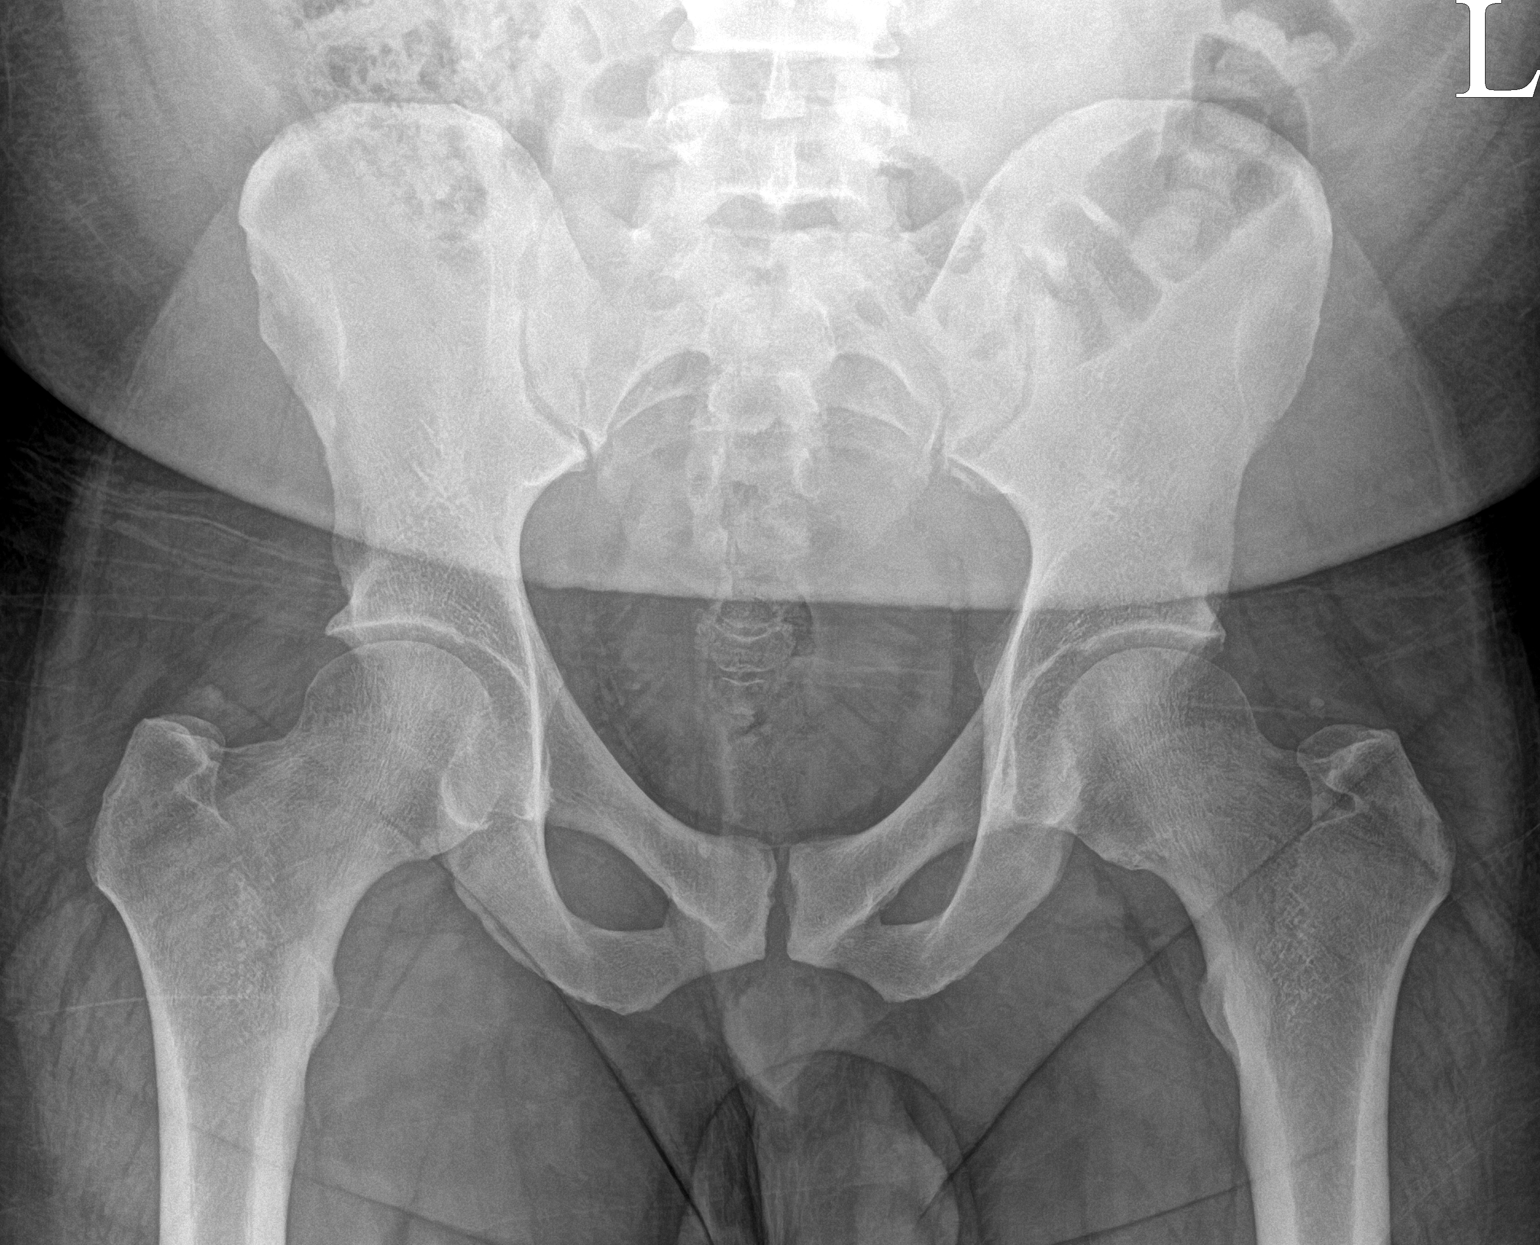

[hip ap]
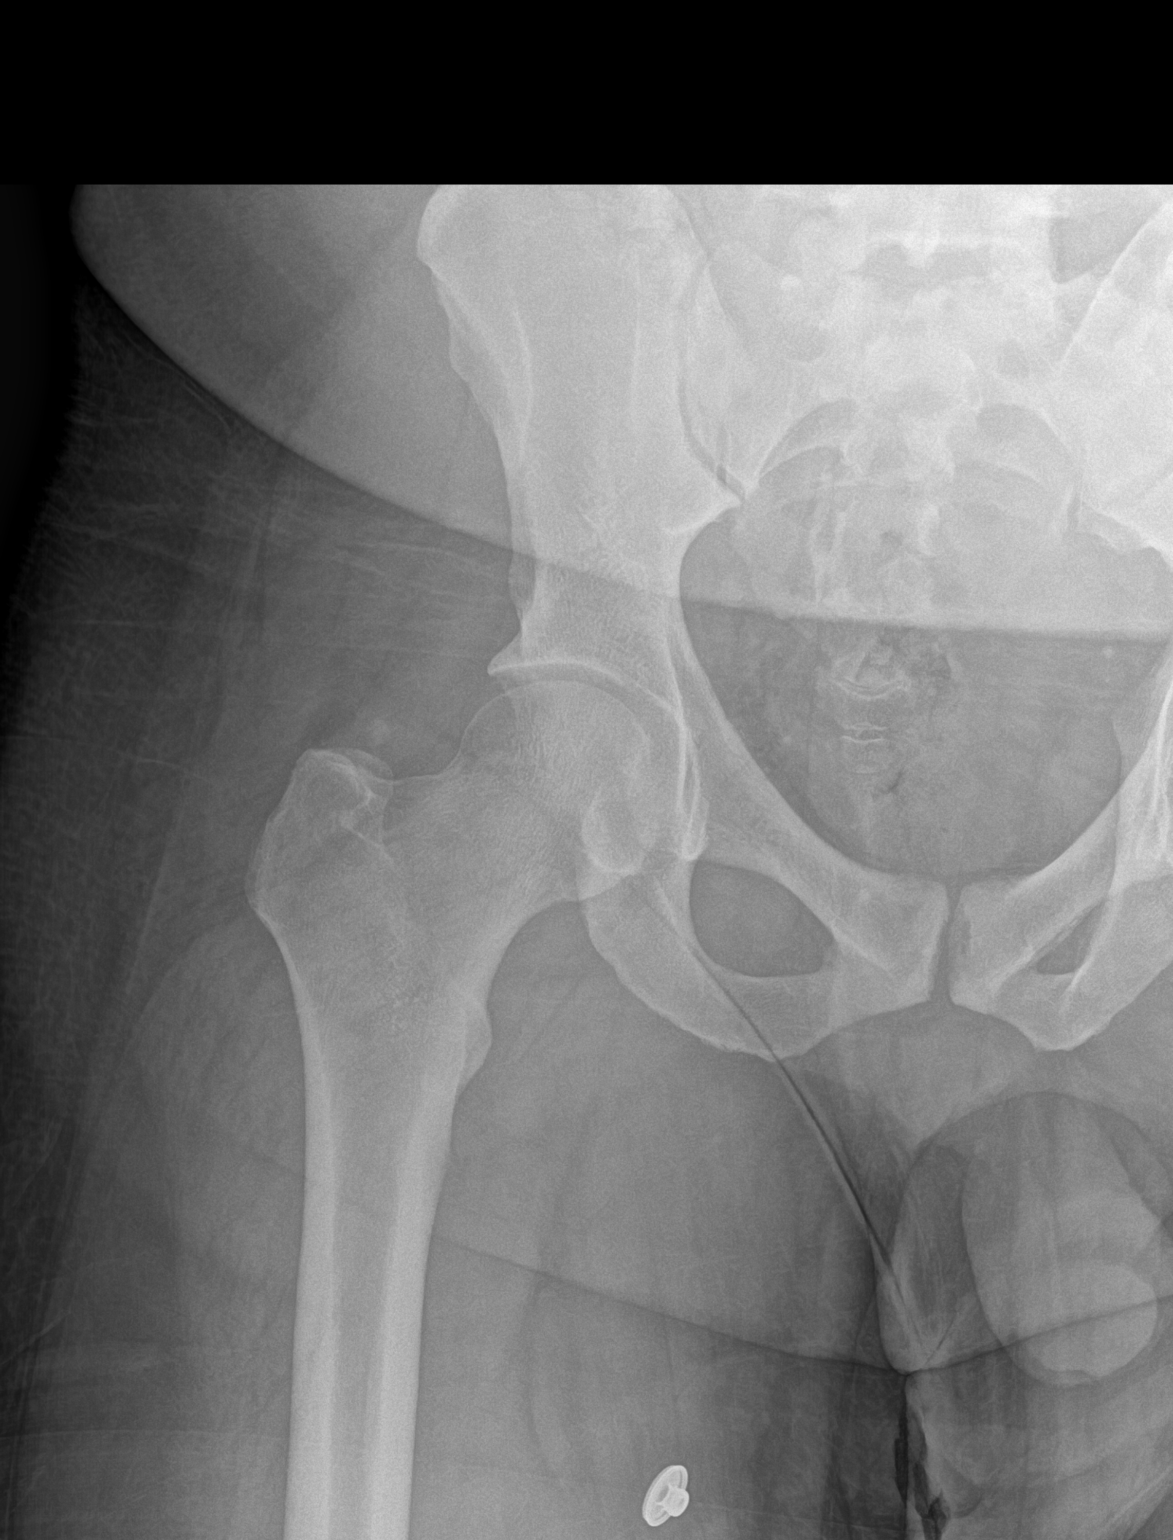

[hip frog leg]
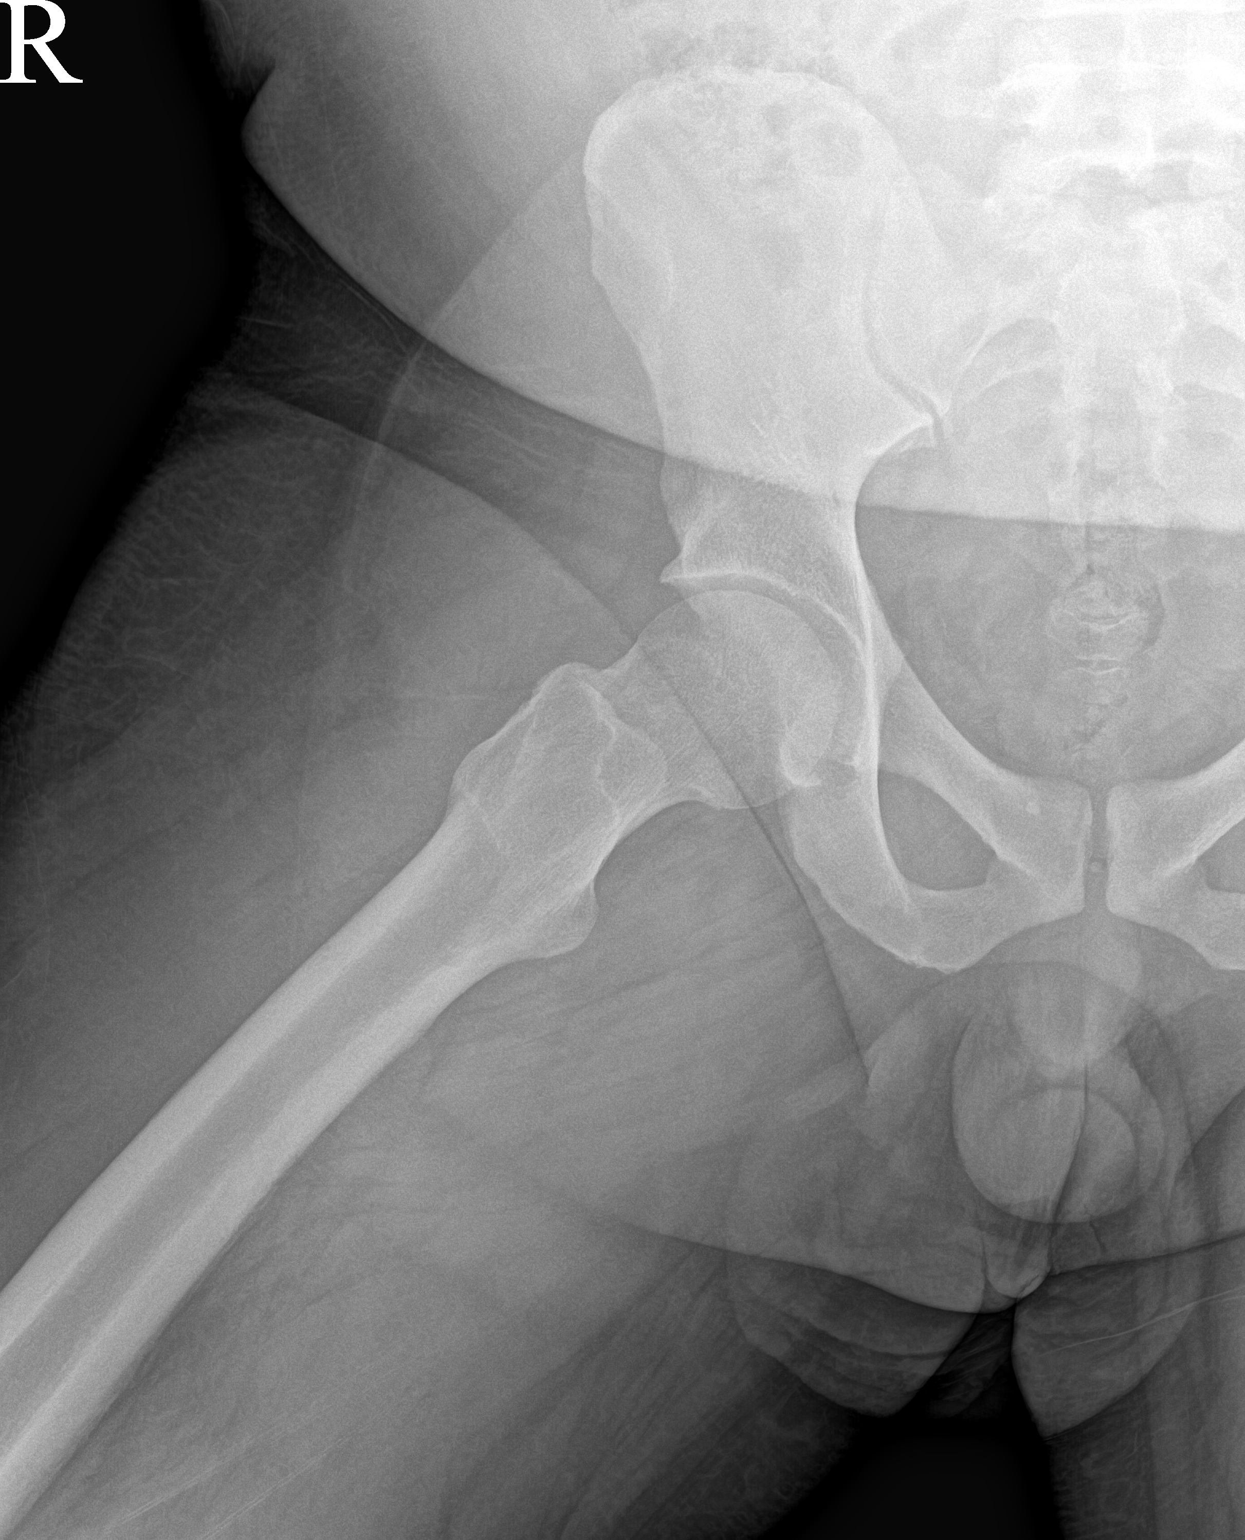

[3 of 3 positions shown; findings below may reference images not displayed]

FINDINGS: There is no evidence of hip fracture or dislocation. There is no
evidence of arthropathy or other focal bone abnormality.
IMPRESSION: Negative.

## 2020-11-22 NOTE — Assessment & Plan Note (Signed)
Diet and nutrition discussed.  Continue rosuvastatin 20 mg daily. Lab Results  Component Value Date   CHOL 165 10/12/2020   HDL 33.60 (L) 10/12/2020   LDLCALC 98 10/12/2020   TRIG 167.0 (H) 10/12/2020   CHOLHDL 5 10/12/2020

## 2020-11-22 NOTE — Assessment & Plan Note (Signed)
Loud snoring.  Daytime sleepiness.  Apnea episodes witnessed by wife.  Patient most likely has sleep apnea.  Needs referral for sleep studies.

## 2020-11-22 NOTE — Assessment & Plan Note (Signed)
Well-controlled hypertension on Micardis HCTZ 40-12.5 mg daily. Normal blood pressure readings at home. Dietary approaches to stop hypertension discussed. Uncontrolled diabetes with high glucose numbers at home Lab Results  Component Value Date   HGBA1C 9.5 (H) 10/12/2020  Will increase insulin lispro to 35 units twice a day.  Continue Victoza 1.2 mg daily. Diet and nutrition discussed. Continue rosuvastatin 20 mg daily. Follow-up in 2 months.

## 2020-11-22 NOTE — Progress Notes (Signed)
Aaron Woods 35 y.o.   Chief Complaint  Patient presents with   Hypertension    Follow up. Pt also would like to discuss a referral for a sleep study. Pt also has right hip pain, x 2 mon    HISTORY OF PRESENT ILLNESS: This is a 35 y.o. male with history of diabetes and hypertension here for follow-up. Last office visit with me 12/28/2019. Recently seen by Dr. Jenny Reichmann on 10/12/2020 Started on Micardis HCT 40-12.5 mg for uncontrolled hypertension.  Working well.  Normal blood pressure readings at home. Presently on rosuvastatin and tolerating it well 20 mg daily. Diabetes: On Victoza 1.2 mg daily and insulin lispro 32 units twice a day. Morning glucose 220s afternoon glucose 280s No hypoglycemic attacks. Today has 2 other complaints: 1.  Right hip pain for 2 months since injury while playing with his son 2.  Possible sleep apnea.  Apnea episodes witnessed by wife.  Loud snoring.  Needs referral to sleep study.  Lab Results  Component Value Date   HGBA1C 9.5 (H) 10/12/2020  Last endocrinology visit 12/28/2019, assessment and plan as follows:  ASSESSMENT / PLAN / RECOMMENDATIONS:    1) ) Type 2 Diabetes Mellitus, Poorly controlled, With retinopathic complications - Most recent A1c of 8.8 %. Goal A1c < 7.0 %.       -His A1c is now from 11.0% -Patient had a steroid injection for symptoms of possible Covid, he attributes his recent hyperglycemia to this.  I have encouraged him to contact us with hypoglycemia so we can adjust his insulin appropriately. - We again discussed add-on therapy with SGLT-2 inhibitors but the pt declined at this time  - He is intolerant to metformin      MEDICATIONS: -Increase Humalog  Mix 32 units With Breakfast and  with Supper  - Continue Victoza 1.2 mg daily      EDUCATION / INSTRUCTIONS: BG monitoring instructions: Patient is instructed to check his blood sugars 2 times a day, before breakfast and supper. Call [facility] if: BG  persistently < 70  I reviewed the Rule of 15 for the treatment of hypoglycemia in detail with the patient. Literature supplied.       2) Diabetic complications:  Eye: Does have known diabetic retinopathy.  Patient urged to contact his ophthalmologist for a follow-up as he has not been seen in 10 months. Neuro/ Feet: Does not have known diabetic peripheral neuropathy .  Renal: Patient does not have known baseline CKD. He   is not on an ACEI/ARB at present.    3) Dyslipidemia: We switched crestor to lipitor due to c/o headaches with crestor  but today he tells me he doesn't take Lipitor either . Pt attributes headaches to metformin as well., We again discussed the cardiovascular benefits of statins. - I have again encouraged him to restart lipitor      Medication Atorvastatin 20 mg daily       F/U in 6 months      Signed electronically by: Mack Guise, MD   [facility]  [facility] [address] [place], [place] [address] Phone: [phone] FAX: [phone]     Hypertension Pertinent negatives include no chest pain, headaches, palpitations or shortness of breath.    Prior to Admission medications   Medication Sig Start Date End Date Taking? Authorizing Provider  blood glucose meter kit and supplies by Other route as directed. Dispense based on patient and insurance preference. Use up  to four times daily as directed. (FOR ICD-10 E10.9, E11.9).   Yes [provider]  Insulin Lispro Prot & Lispro (HUMALOG MIX 75/25 KWIKPEN) (75-25) 100 UNIT/ML Kwikpen Inject 32 Units into the skin 2 (two) times daily with a meal. 01/30/20  Yes Shamleffer, Melanie Crazier, MD  Insulin Pen Needle (B-D UF III MINI PEN NEEDLES) 31G X 5 MM MISC USE 1 PEN NEEDLE 1 TO 4 TIMES DAILY 10/29/20  Yes Shamleffer, Melanie Crazier, MD  liraglutide (VICTOZA) 18 MG/3ML SOPN Inject 1.2 mg into the skin daily. 01/30/20  Yes Shamleffer, Melanie Crazier, MD   rosuvastatin (CRESTOR) 20 MG tablet Take 1 tablet (20 mg total) by mouth daily. 10/12/20  Yes Biagio Borg, MD  telmisartan-hydrochlorothiazide (MICARDIS HCT) 40-12.5 MG tablet Take 1 tablet by mouth daily. 10/12/20  Yes Biagio Borg, MD  acetaminophen (TYLENOL) 325 MG tablet Take 650 mg by mouth every 6 (six) hours as needed for mild pain or headache. 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  Social History   Socioeconomic History   Marital status: Married    Spouse name: Not on file   Number of children: Not on file   Years of education: Not on file   Highest education level: Not on file  Occupational History   Not on file  Tobacco Use   Smoking status: Never   Smokeless tobacco: Never  Substance and Sexual Activity   Alcohol use: No   Drug use: No   Sexual activity: Not on file  Other Topics Concern   Not on file  Social History Narrative   Not on file   Social Determinants of Health   Financial Resource Strain: Not on file  Food Insecurity: Not on file  Transportation Needs: Not on file  Physical Activity: Not on file  Stress: Not on file  Social Connections: Not on file  Intimate Partner Violence: Not on file    Family History  Problem Relation Age of Onset   Diabetes Mother      Review of Systems  Constitutional: Negative.  Negative for chills and fever.  HENT: Negative.  Negative for congestion and  sore throat.   Respiratory: Negative.  Negative for cough and shortness of breath.   Cardiovascular: Negative.  Negative for chest pain and palpitations.  Gastrointestinal:  Negative for abdominal pain, diarrhea, nausea and vomiting.  Genitourinary: Negative.  Negative for dysuria and hematuria.  Skin: Negative.  Negative for rash.  Neurological:  Negative for dizziness and headaches.  All other systems reviewed and are negative.  Today's Vitals   11/22/20 0908  BP: (!) 142/86  Pulse: 81  Temp: 98.4 F (36.9 C)  TempSrc: Oral  SpO2: 97%  Weight: 255 lb (115.7 kg)  Height: _0  (1.753 m)   Body mass index is 37.66 kg/m. BP Readings from Last 3 Encounters:  11/22/20 (!) 142/86  10/12/20 (!) 178/118  12/28/19 134/85  Wt Readings from Last 3 Encounters:  11/22/20 255 lb (115.7 kg)  10/12/20 257 lb 12.8 oz (116.9 kg)  12/28/19 237 lb 6.4 oz (107.7 kg)     Physical Exam Vitals reviewed.  Constitutional:      Appearance: He is obese.  HENT:     Head: Normocephalic.  Eyes:     Extraocular Movements: Extraocular movements intact.     Conjunctiva/sclera: Conjunctivae normal.     Pupils: Pupils are equal, round, and reactive to light.  Cardiovascular:     Rate and Rhythm: Normal rate and regular rhythm.     Pulses: Normal pulses.     Heart sounds: Normal heart sounds.  Pulmonary:     Effort: Pulmonary effort is normal.     Breath sounds: Normal breath sounds.  Abdominal:     Palpations: Abdomen is soft.     Tenderness: There is no abdominal tenderness.  Musculoskeletal:        General: Normal range of motion.     Cervical back: Normal range of motion and neck supple.     Comments: Right hip: Nontender with full range of motion although complaining of pain  Skin:    General: Skin is warm and dry.     Capillary Refill: Capillary refill takes less than 2 seconds.  Neurological:     General: No focal deficit present.     Mental Status: He is alert and oriented to  person, place, and time.  Psychiatric:        Mood and Affect: Mood normal.        Behavior: Behavior normal.     ASSESSMENT & PLAN: Hypertension associated with diabetes ([place]) Well-controlled hypertension on Micardis HCTZ 40-12.5 mg daily. Normal blood pressure readings at home. Dietary approaches to stop hypertension discussed. Uncontrolled diabetes with high glucose numbers at home Lab Results  Component Value Date   HGBA1C 9.5 (H) 10/12/2020  Will increase insulin lispro to 35 units twice a day.  Continue Victoza 1.2 mg daily. Diet and nutrition discussed. Continue rosuvastatin 20 mg daily. Follow-up in 2 months.   Dyslipidemia associated with type 2 diabetes mellitus ([place]) Diet and nutrition discussed.  Continue rosuvastatin 20 mg daily. Lab Results  Component Value Date   CHOL 165 10/12/2020   HDL 33.60 (L) 10/12/2020   LDLCALC 98 10/12/2020   TRIG 167.0 (H) 10/12/2020   CHOLHDL 5 10/12/2020     Suspected sleep apnea Loud snoring.  Daytime sleepiness.  Apnea episodes witnessed by wife.  Patient most likely has sleep apnea.  Needs referral for sleep studies.  [place] was seen today for hypertension.  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 Eating healthy foods in the right amounts, at about the  same times every day, can help you: Control your blood glucose. Lower your risk of heart disease. Improve your blood pressure. Reach or maintain a healthy weight. What can affect my meal plan? Every person with diabetes is different, and each person has different needs for a meal plan. Your health care provider may recommend that you work with a dietitian to make a meal plan that is best for you. Your meal plan may vary depending on factors such as: The calories you need. The medicines you take. Your weight. Your blood glucose, blood pressure, and cholesterol levels. Your activity level. Other health conditions you have, such as heart or kidney disease. How do carbohydrates affect me? Carbohydrates, also called carbs, affect your blood glucose level more than any other type of food. Eating carbs naturally raises the amount of glucose in your blood. Carb counting is a method for keeping track of how many carbs you eat. Counting carbs is important to keep your blood glucose at a healthy level, especially if you use insulin or take certain oral diabetes medicines. It is important to know how many carbs you can safely have in each meal. This is different for every person. Your dietitian can help you calculate how many carbs you should have at each meal and for each snack. How does alcohol affect me? Alcohol can cause a sudden decrease in blood glucose (hypoglycemia), especially if you use insulin or take certain oral diabetes medicines. Hypoglycemia can be a life-threatening condition. Symptoms of hypoglycemia, such as sleepiness, dizziness, and confusion, are similar to symptoms of having too much alcohol. Do not drink alcohol if: Your health care provider tells you not to drink. You are pregnant, may be pregnant, or are planning to become pregnant. If you drink alcohol: Do not drink on an empty  stomach. Limit how much you use to: 0-1 drink a day for women. 0-2 drinks a day for men. Be aware of how much alcohol is in your drink. In the [place] one drink equals one 12 oz bottle of beer (355 mL), one 5 oz glass of wine (148 mL), or one 1 oz glass of hard liquor (44 mL). Keep yourself hydrated with water, diet soda, or unsweetened iced tea. Keep in mind that regular soda, juice, and other mixers may contain a lot of sugar and must be counted as carbs. What are tips for following this plan? Reading food labels Start by checking the serving size on the "Nutrition Facts" label of packaged foods and drinks. The amount of calories, carbs, fats, and other nutrients listed on the label is based on one serving of the item. Many items contain more than one serving per package. Check the total grams (g) of carbs in one serving. You can calculate the number of servings of carbs in one serving by dividing the total carbs by 15. For example, if a food has 30 g of total carbs per serving, it would be equal to 2 servings of carbs. Check the number of grams (g) of saturated fats and trans fats in one serving. Choose foods that have a low amount or none of these fats. Check the number of milligrams (mg) of salt (sodium) in one serving. Most people should limit total sodium intake to less than 2,300 mg per day. Always check the nutrition information of foods labeled as "low-fat" or "nonfat." These foods may be higher in added sugar or refined carbs and should be avoided. Talk to your dietitian to identify your daily goals  for nutrients listed on the label. Shopping Avoid buying canned, pre-made, or processed foods. These foods tend to be high in fat, sodium, and added sugar. Shop around the outside edge of the grocery store. This is where you will most often find fresh fruits and vegetables, bulk grains, fresh meats, and fresh dairy. Cooking Use low-heat cooking methods, such as baking, instead of high-heat  cooking methods like deep frying. Cook using healthy oils, such as olive, canola, or sunflower oil. Avoid cooking with butter, cream, or high-fat meats. Meal planning Eat meals and snacks regularly, preferably at the same times every day. Avoid going long periods of time without eating. Eat foods that are high in fiber, such as fresh fruits, vegetables, beans, and whole grains. Talk with your dietitian about how many servings of carbs you can eat at each meal. Eat 4-6 oz (112-168 g) of lean protein each day, such as lean meat, chicken, fish, eggs, or tofu. One ounce (oz) of lean protein is equal to: 1 oz (28 g) of meat, chicken, or fish. 1 egg.  cup (62 g) of tofu. Eat some foods each day that contain healthy fats, such as avocado, nuts, seeds, and fish. What foods should I eat? Fruits Berries. Apples. Oranges. Peaches. Apricots. Plums. Grapes. Mango. Papaya. Pomegranate. Kiwi. Cherries. Vegetables Lettuce. Spinach. Leafy greens, including kale, chard, collard greens, and mustard greens. Beets. Cauliflower. Cabbage. Broccoli. Carrots. Green beans. Tomatoes. Peppers. Onions. Cucumbers. Brussels sprouts. Grains Whole grains, such as whole-wheat or whole-grain bread, crackers, tortillas, cereal, and pasta. Unsweetened oatmeal. Quinoa. Brown or wild rice. Meats and other proteins Seafood. Poultry without skin. Lean cuts of poultry and beef. Tofu. Nuts. Seeds. Dairy Low-fat or fat-free dairy products such as milk, yogurt, and cheese. The items listed above may not be a complete list of foods and beverages you can eat. Contact a dietitian for more information. What foods should I avoid? Fruits Fruits canned with syrup. Vegetables Canned vegetables. Frozen vegetables with butter or cream sauce. Grains Refined white flour and flour products such as bread, pasta, snack foods, and cereals. Avoid all processed foods. Meats and other proteins Fatty cuts of meat. Poultry with skin. Breaded or  fried meats. Processed meat. Avoid saturated fats. Dairy Full-fat yogurt, cheese, or milk. Beverages Sweetened drinks, such as soda or iced tea. The items listed above may not be a complete list of foods and beverages you should avoid. Contact a dietitian for more information. Questions to ask a health care provider Do I need to meet with a diabetes educator? Do I need to meet with a dietitian? What number can I call if I have questions? When are the best times to check my blood glucose? Where to find more information: American Diabetes Association: diabetes.org Academy of Nutrition and Dietetics: www.eatright.[employer] of Diabetes and Digestive and Kidney Diseases: [employer] Association of Diabetes Care and Education Specialists: www.diabeteseducator.org Summary It is important to have healthy eating habits because your blood sugar (glucose) levels are greatly affected by what you eat and drink. A healthy meal plan will help you control your blood glucose and maintain a healthy lifestyle. Your health care provider may recommend that you work with a dietitian to make a meal plan that is best for you. Keep in mind that carbohydrates (carbs) and alcohol have immediate effects on your blood glucose levels. It is important to count carbs and to use alcohol carefully. This information is not intended to replace advice given to you by your health care  provider. Make sure you discuss any questions you have with your health care provider. Document Revised: 02/15/2019 Document Reviewed: 02/15/2019 Elsevier Patient Education  2021 [place], MD [facility]

## 2020-11-22 NOTE — Patient Instructions (Signed)
Increase insulin lispro to 35 units twice a day Continue Victoza 1.2 mg daily Follow-up in 3 months Diabetes Mellitus and Nutrition, Adult When you have diabetes, or diabetes mellitus, it is very important to have healthy eating habits because your blood sugar (glucose) levels are greatly affected by what you eat and drink. Eating healthy foods in the right amounts, at about the same times every day, can help you: Control your blood glucose. Lower your risk of heart disease. Improve your blood pressure. Reach or maintain a healthy weight. What can affect my meal plan? Every person with diabetes is different, and each person has different needs for a meal plan. Your health care provider may recommend that you work with a dietitian to make a meal plan that is best for you. Your meal plan may vary depending on factors such as: The calories you need. The medicines you take. Your weight. Your blood glucose, blood pressure, and cholesterol levels. Your activity level. Other health conditions you have, such as heart or kidney disease. How do carbohydrates affect me? Carbohydrates, also called carbs, affect your blood glucose level more than any other type of food. Eating carbs naturally raises the amount of glucose in your blood. Carb counting is a method for keeping track of how many carbs you eat. Counting carbs is important to keep your blood glucose at a healthy level, especially if you use insulin or take certain oral diabetes medicines. It is important to know how many carbs you can safely have in each meal. This is different for every person. Your dietitian can help you calculate how many carbs you should have at each meal and for each snack. How does alcohol affect me? Alcohol can cause a sudden decrease in blood glucose (hypoglycemia), especially if you use insulin or take certain oral diabetes medicines. Hypoglycemia can be a life-threatening condition. Symptoms of hypoglycemia, such as  sleepiness, dizziness, and confusion, are similar to symptoms of having too much alcohol. Do not drink alcohol if: Your health care provider tells you not to drink. You are pregnant, may be pregnant, or are planning to become pregnant. If you drink alcohol: Do not drink on an empty stomach. Limit how much you use to: 0-1 drink a day for women. 0-2 drinks a day for men. Be aware of how much alcohol is in your drink. In the [place] one drink equals one 12 oz bottle of beer (355 mL), one 5 oz glass of wine (148 mL), or one 1 oz glass of hard liquor (44 mL). Keep yourself hydrated with water, diet soda, or unsweetened iced tea. Keep in mind that regular soda, juice, and other mixers may contain a lot of sugar and must be counted as carbs. What are tips for following this plan? Reading food labels Start by checking the serving size on the "Nutrition Facts" label of packaged foods and drinks. The amount of calories, carbs, fats, and other nutrients listed on the label is based on one serving of the item. Many items contain more than one serving per package. Check the total grams (g) of carbs in one serving. You can calculate the number of servings of carbs in one serving by dividing the total carbs by 15. For example, if a food has 30 g of total carbs per serving, it would be equal to 2 servings of carbs. Check the number of grams (g) of saturated fats and trans fats in one serving. Choose foods that have a low amount or none of  these fats. Check the number of milligrams (mg) of salt (sodium) in one serving. Most people should limit total sodium intake to less than 2,300 mg per day. Always check the nutrition information of foods labeled as "low-fat" or "nonfat." These foods may be higher in added sugar or refined carbs and should be avoided. Talk to your dietitian to identify your daily goals for nutrients listed on the label. Shopping Avoid buying canned, pre-made, or processed foods. These foods  tend to be high in fat, sodium, and added sugar. Shop around the outside edge of the grocery store. This is where you will most often find fresh fruits and vegetables, bulk grains, fresh meats, and fresh dairy. Cooking Use low-heat cooking methods, such as baking, instead of high-heat cooking methods like deep frying. Cook using healthy oils, such as olive, canola, or sunflower oil. Avoid cooking with butter, cream, or high-fat meats. Meal planning Eat meals and snacks regularly, preferably at the same times every day. Avoid going long periods of time without eating. Eat foods that are high in fiber, such as fresh fruits, vegetables, beans, and whole grains. Talk with your dietitian about how many servings of carbs you can eat at each meal. Eat 4-6 oz (112-168 g) of lean protein each day, such as lean meat, chicken, fish, eggs, or tofu. One ounce (oz) of lean protein is equal to: 1 oz (28 g) of meat, chicken, or fish. 1 egg.  cup (62 g) of tofu. Eat some foods each day that contain healthy fats, such as avocado, nuts, seeds, and fish. What foods should I eat? Fruits Berries. Apples. Oranges. Peaches. Apricots. Plums. Grapes. Mango. Papaya. Pomegranate. Kiwi. Cherries. Vegetables Lettuce. Spinach. Leafy greens, including kale, chard, collard greens, and mustard greens. Beets. Cauliflower. Cabbage. Broccoli. Carrots. Green beans. Tomatoes. Peppers. Onions. Cucumbers. Brussels sprouts. Grains Whole grains, such as whole-wheat or whole-grain bread, crackers, tortillas, cereal, and pasta. Unsweetened oatmeal. Quinoa. Brown or wild rice. Meats and other proteins Seafood. Poultry without skin. Lean cuts of poultry and beef. Tofu. Nuts. Seeds. Dairy Low-fat or fat-free dairy products such as milk, yogurt, and cheese. The items listed above may not be a complete list of foods and beverages you can eat. Contact a dietitian for more information. What foods should I avoid? Fruits Fruits canned with  syrup. Vegetables Canned vegetables. Frozen vegetables with butter or cream sauce. Grains Refined white flour and flour products such as bread, pasta, snack foods, and cereals. Avoid all processed foods. Meats and other proteins Fatty cuts of meat. Poultry with skin. Breaded or fried meats. Processed meat. Avoid saturated fats. Dairy Full-fat yogurt, cheese, or milk. Beverages Sweetened drinks, such as soda or iced tea. The items listed above may not be a complete list of foods and beverages you should avoid. Contact a dietitian for more information. Questions to ask a health care provider Do I need to meet with a diabetes educator? Do I need to meet with a dietitian? What number can I call if I have questions? When are the best times to check my blood glucose? Where to find more information: American Diabetes Association: diabetes.org Academy of Nutrition and Dietetics: www.eatright.[employer] of Diabetes and Digestive and Kidney Diseases: [employer] Association of Diabetes Care and Education Specialists: www.diabeteseducator.org Summary It is important to have healthy eating habits because your blood sugar (glucose) levels are greatly affected by what you eat and drink. A healthy meal plan will help you control your blood glucose and maintain a healthy lifestyle.  Your health care provider may recommend that you work with a dietitian to make a meal plan that is best for you. Keep in mind that carbohydrates (carbs) and alcohol have immediate effects on your blood glucose levels. It is important to count carbs and to use alcohol carefully. This information is not intended to replace advice given to you by your health care provider. Make sure you discuss any questions you have with your health care provider. Document Revised: 02/15/2019 Document Reviewed: 02/15/2019 Elsevier Patient Education  2021 [employer].

## 2020-12-18 ENCOUNTER — Other Ambulatory Visit: Payer: Self-pay | Admitting: Internal Medicine

## 2021-01-04 ENCOUNTER — Telehealth: Payer: Self-pay | Admitting: Internal Medicine

## 2021-01-04 MED ORDER — INSULIN LISPRO PROT & LISPRO (75-25 MIX) 100 UNIT/ML KWIKPEN
32.0000 [IU] | PEN_INJECTOR | Freq: Two times a day (BID) | SUBCUTANEOUS | 0 refills | Status: DC
Start: 2021-01-04 — End: 2021-02-28

## 2021-01-04 NOTE — Telephone Encounter (Signed)
Pt calling in to request refills of Victoza and HUMALOG MIX 75/25 KWIKPEN to [facility] - [place], [place] - [address] Until next appt in Dec. Pt contact [phone]

## 2021-01-04 NOTE — Telephone Encounter (Signed)
Script sent  

## 2021-02-11 ENCOUNTER — Institutional Professional Consult (permissible substitution): Payer: 59 | Admitting: Neurology

## 2021-02-15 ENCOUNTER — Other Ambulatory Visit: Payer: Self-pay | Admitting: Internal Medicine

## 2021-02-15 DIAGNOSIS — E1165 Type 2 diabetes mellitus with hyperglycemia: Secondary | ICD-10-CM

## 2021-02-21 ENCOUNTER — Other Ambulatory Visit: Payer: Self-pay

## 2021-02-21 ENCOUNTER — Encounter: Payer: Self-pay | Admitting: Emergency Medicine

## 2021-02-21 ENCOUNTER — Ambulatory Visit: Payer: 59 | Admitting: Emergency Medicine

## 2021-02-21 VITALS — BP 146/86 | HR 80 | Ht 69.0 in | Wt 259.0 lb

## 2021-02-21 DIAGNOSIS — E1169 Type 2 diabetes mellitus with other specified complication: Secondary | ICD-10-CM

## 2021-02-21 DIAGNOSIS — I152 Hypertension secondary to endocrine disorders: Secondary | ICD-10-CM

## 2021-02-21 DIAGNOSIS — E1159 Type 2 diabetes mellitus with other circulatory complications: Secondary | ICD-10-CM | POA: Diagnosis not present

## 2021-02-21 DIAGNOSIS — E1165 Type 2 diabetes mellitus with hyperglycemia: Secondary | ICD-10-CM

## 2021-02-21 DIAGNOSIS — R29818 Other symptoms and signs involving the nervous system: Secondary | ICD-10-CM

## 2021-02-21 DIAGNOSIS — E113413 Type 2 diabetes mellitus with severe nonproliferative diabetic retinopathy with macular edema, bilateral: Secondary | ICD-10-CM | POA: Diagnosis not present

## 2021-02-21 DIAGNOSIS — G8929 Other chronic pain: Secondary | ICD-10-CM

## 2021-02-21 DIAGNOSIS — M25551 Pain in right hip: Secondary | ICD-10-CM

## 2021-02-21 DIAGNOSIS — E785 Hyperlipidemia, unspecified: Secondary | ICD-10-CM

## 2021-02-21 DIAGNOSIS — Z794 Long term (current) use of insulin: Secondary | ICD-10-CM

## 2021-02-21 LAB — POCT GLYCOSYLATED HEMOGLOBIN (HGB A1C): Hemoglobin A1C: 11.4 % — AB (ref 4.0–5.6)

## 2021-02-21 MED ORDER — EMPAGLIFLOZIN 10 MG PO TABS: 10.0000 mg | ORAL_TABLET | Freq: Every day | ORAL | 3 refills | Status: DC

## 2021-02-21 MED ORDER — TELMISARTAN-HCTZ 80-12.5 MG PO TABS: 1.0000 | ORAL_TABLET | Freq: Every day | ORAL | 3 refills | Status: DC

## 2021-02-21 NOTE — Assessment & Plan Note (Signed)
Scheduled for a sleep apnea test next week.

## 2021-02-21 NOTE — Patient Instructions (Signed)

## 2021-02-21 NOTE — Assessment & Plan Note (Signed)
Diet and nutrition discussed.  Continue rosuvastatin 20 mg daily. 

## 2021-02-21 NOTE — Assessment & Plan Note (Signed)
Uncontrolled hypertension with elevated blood pressure readings at home. Will increase dose of Micardis to 80-12 0.5 mg daily. Dietary approaches to stop hypertension discussed. Uncontrolled diabetes with hemoglobin A1c at 11.4.  Has been off Victoza for least a week. Continue twice daily insulin lispro.  Continue Victoza at 1.6 mg daily. Will start Jardiance 10 mg daily. Has appointment with endocrinologist next week. Diet and nutrition discussed. Follow-up in 3 months.

## 2021-02-21 NOTE — Progress Notes (Signed)
Aaron Woods 35 y.o.   Chief Complaint  Patient presents with   Diabetes    3 month f/u    HISTORY OF PRESENT ILLNESS: This is a 35 y.o. male with history of hypertension diabetes here for follow-up. Sees endocrinologist on a regular basis.  Has history of uncontrolled diabetes.  Has appointment for next week. Was off Victoza for about a week.  On insulin twice a day.  No other diabetes medications. Elevated blood glucose readings at home. History of hypertension with elevated blood pressure readings at home. Scheduled for sleep apnea test next week. Continues to take rosuvastatin 20 mg daily. BP Readings from Last 3 Encounters:  02/21/21 (!) 146/86  11/22/20 130/80  10/12/20 (!) 178/118   Lab Results  Component Value Date   HGBA1C 9.5 (H) 10/12/2020   Wt Readings from Last 3 Encounters:  02/21/21 259 lb (117.5 kg)  11/22/20 255 lb (115.7 kg)  10/12/20 257 lb 12.8 oz (116.9 kg)     Diabetes Pertinent negatives for hypoglycemia include no dizziness or headaches. Pertinent negatives for diabetes include no chest pain.    Prior to Admission medications   Medication Sig Start Date End Date Taking? Authorizing Provider  blood glucose meter kit and supplies by Other route as directed. Dispense based on patient and insurance preference. Use up to four times daily as directed. (FOR ICD-10 E10.9, E11.9).   Yes [provider]  Insulin Lispro Prot & Lispro (HUMALOG MIX 75/25 KWIKPEN) (75-25) 100 UNIT/ML Kwikpen Inject 32 Units into the skin 2 (two) times daily with a meal. 01/04/21  Yes Shamleffer, Melanie Crazier, MD  Insulin Pen Needle (B-D UF III MINI PEN NEEDLES) 31G X 5 MM MISC USE 1 PEN NEEDLE ONE TO FOUR TIMES DAILY 12/19/20  Yes Shamleffer, Melanie Crazier, MD  rosuvastatin (CRESTOR) 20 MG tablet Take 1 tablet (20 mg total) by mouth daily. 10/12/20  Yes Biagio Borg, MD  telmisartan-hydrochlorothiazide (MICARDIS HCT) 40-12.5 MG tablet Take 1 tablet by mouth daily.  10/12/20  Yes Biagio Borg, MD  VICTOZA 18 MG/3ML SOPN INJECT 1.2 MG INTO THE SKIN DAILY 02/17/21  Yes Shamleffer, Melanie Crazier, MD  acetaminophen (TYLENOL) 325 MG tablet Take 650 mg by mouth every 6 (six) hours as needed for mild pain or headache. 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  Social History   Socioeconomic History   Marital status: Married    Spouse name: Not on file   Number of children: Not on file   Years of education: Not on file   Highest education level: Not on file  Occupational History   Not on file  Tobacco Use   Smoking status: Never   Smokeless tobacco: Never  Substance and Sexual Activity   Alcohol use: No   Drug use: No   Sexual activity: Not on file  Other Topics  Concern   Not on file  Social History Narrative   Not on file   Social Determinants of Health   Financial Resource Strain: Not on file  Food Insecurity: Not on file  Transportation Needs: Not on file  Physical Activity: Not on file  Stress: Not on file  Social Connections: Not on file  Intimate Partner Violence: Not on file    Family History  Problem Relation Age of Onset   Diabetes Mother      Review of Systems  Constitutional: Negative.  Negative for chills and fever.  HENT: Negative.  Negative for congestion and sore throat.   Respiratory:  Negative.  Negative for cough and shortness of breath.   Cardiovascular: Negative.  Negative for chest pain and palpitations.  Gastrointestinal: Negative.  Negative for abdominal pain, diarrhea, nausea and vomiting.  Genitourinary: Negative.  Negative for dysuria and hematuria.  Musculoskeletal:  Positive for joint pain (Chronic right hip pain).  Skin: Negative.  Negative for rash.  Neurological: Negative.  Negative for dizziness and headaches.  All other systems reviewed and are negative.  Today's Vitals   02/21/21 0844  BP: (!) 146/86  Pulse: 80  SpO2: 98%  Weight: 259 lb (117.5 kg)  Height: _0  (1.753 m)   Body mass index is 38.25 kg/m.  Physical Exam Vitals reviewed.  Constitutional:      Appearance: Normal appearance.  HENT:     Head: Normocephalic.  Eyes:     Extraocular Movements: Extraocular movements intact.     Pupils: Pupils are equal, round, and reactive to light.  Cardiovascular:     Rate and Rhythm: Normal rate and regular rhythm.     Pulses: Normal pulses.     Heart sounds: Normal heart sounds.  Pulmonary:     Effort: Pulmonary effort is normal.     Breath sounds: Normal breath sounds.  Musculoskeletal:        General: Normal range of motion.     Cervical back: Normal range of motion and neck supple.  Skin:    General: Skin is warm and dry.     Capillary Refill: Capillary refill takes less than 2 seconds.  Neurological:     General: No focal deficit present.     Mental Status: He is alert and oriented to person, place, and time.  Psychiatric:        Mood and Affect: Mood normal.        Behavior: Behavior normal.   Results for orders placed or performed in visit on 02/21/21 (from the past 24 hour(s))  POCT glycosylated hemoglobin (Hb A1C)     Status: Abnormal   Collection Time: 02/21/21  8:54 AM  Result Value Ref Range   Hemoglobin A1C 11.4 (A) 4.0 - 5.6 %   HbA1c POC (<> result, manual entry)     HbA1c, POC (prediabetic range)     HbA1c, POC  (controlled diabetic range)        ASSESSMENT & PLAN: A total of 45 minutes was spent with the patient and counseling/coordination of care regarding preparing for this visit, review of most recent office visit notes, review of most recent blood work results including today's hemoglobin A1c, review of all medications and changes made including introduction of Jardiance 10 mg daily, need to follow-up with endocrinologist next week, uncontrolled hypertension and cardiovascular risks associated with this condition, need to increase dose of antihypertensive, education on nutrition, need for testing of sleep apnea, prognosis, documentation and need for follow-up.  Problem List Items Addressed This Visit       Cardiovascular and Mediastinum   Hypertension associated with diabetes ([place])    Uncontrolled hypertension with elevated blood pressure readings at home. Will increase dose of Micardis to 80-12 0.5 mg daily. Dietary approaches to stop hypertension discussed. Uncontrolled diabetes with hemoglobin A1c at 11.4.  Has been off Victoza for least a week. Continue twice daily insulin lispro.  Continue Victoza at 1.6 mg daily. Will start Jardiance 10 mg daily. Has appointment with endocrinologist next week. Diet and nutrition discussed. Follow-up in 3 months.      Relevant Medications   telmisartan-hydrochlorothiazide (MICARDIS HCT) 80-12.5 MG tablet   empagliflozin (JARDIANCE) 10 MG TABS tablet     Endocrine   Dyslipidemia associated with type 2 diabetes mellitus ([place]) - Primary    Diet and nutrition discussed.  Continue rosuvastatin 20 mg daily.       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      Other Visit Diagnoses     Uncontrolled type 2 diabetes mellitus with hyperglycemia ([facility])       Relevant Medications   telmisartan-hydrochlorothiazide (MICARDIS HCT) 80-12.5 MG tablet   empagliflozin (JARDIANCE) 10 MG TABS tablet   Other Relevant Orders   POCT glycosylated hemoglobin (Hb A1C) (Completed)   Chronic right hip pain       Relevant Orders   Ambulatory referral to Orthopedic Surgery      Patient Instructions  Diabetes Mellitus and Nutrition, Adult When you have diabetes, or diabetes mellitus, it is very important to have healthy eating habits because your blood sugar (glucose) levels are greatly affected by what you eat and drink. Eating healthy foods in the right amounts, at about the same times every day, can help you: Manage your blood glucose. Lower your risk of heart disease. Improve your blood pressure. Reach or maintain a healthy weight. What can affect my meal plan? Every person with diabetes is different, and each person has different needs for a meal plan. Your health care provider may recommend that you work with a dietitian to make a meal plan that is best for you. Your meal plan may vary depending on factors such as: The calories you need. The medicines you take. Your weight. Your blood glucose, blood pressure, and cholesterol levels. Your activity level. Other health conditions you have, such as heart or kidney disease. How do carbohydrates affect me? Carbohydrates, also called carbs, affect your blood glucose level more than any other type of food. Eating carbs raises the amount of glucose in your blood. It is important to know how many carbs you can safely have in each meal. This is different for every person. Your dietitian can help you calculate how many  carbs you should have at each meal and for each snack. How does alcohol affect me? Alcohol can cause a decrease in blood glucose (hypoglycemia), especially if you use insulin or take certain diabetes medicines by mouth. Hypoglycemia can be a life-threatening condition. Symptoms of hypoglycemia, such as sleepiness, dizziness, and confusion, are  similar to symptoms of having too much alcohol. Do not drink alcohol if: Your health care provider tells you not to drink. You are pregnant, may be pregnant, or are planning to become pregnant. If you drink alcohol: Limit how much you have to: 0-1 drink a day for women. 0-2 drinks a day for men. Know how much alcohol is in your drink. In the [place] one drink equals one 12 oz bottle of beer (355 mL), one 5 oz glass of wine (148 mL), or one 1 oz glass of hard liquor (44 mL). Keep yourself hydrated with water, diet soda, or unsweetened iced tea. Keep in mind that regular soda, juice, and other mixers may contain a lot of sugar and must be counted as carbs. What are tips for following this plan? Reading food labels Start by checking the serving size on the Nutrition Facts label of packaged foods and drinks. The number of calories and the amount of carbs, fats, and other nutrients listed on the label are based on one serving of the item. Many items contain more than one serving per package. Check the total grams (g) of carbs in one serving. Check the number of grams of saturated fats and trans fats in one serving. Choose foods that have a low amount or none of these fats. Check the number of milligrams (mg) of salt (sodium) in one serving. Most people should limit total sodium intake to less than 2,300 mg per day. Always check the nutrition information of foods labeled as "low-fat" or "nonfat." These foods may be higher in added sugar or refined carbs and should be avoided. Talk to your dietitian to identify your daily goals for nutrients listed on the  label. Shopping Avoid buying canned, pre-made, or processed foods. These foods tend to be high in fat, sodium, and added sugar. Shop around the outside edge of the grocery store. This is where you will most often find fresh fruits and vegetables, bulk grains, fresh meats, and fresh dairy products. Cooking Use low-heat cooking methods, such as baking, instead of high-heat cooking methods, such as deep frying. Cook using healthy oils, such as olive, canola, or sunflower oil. Avoid cooking with butter, cream, or high-fat meats. Meal planning Eat meals and snacks regularly, preferably at the same times every day. Avoid going long periods of time without eating. Eat foods that are high in fiber, such as fresh fruits, vegetables, beans, and whole grains. Eat 4-6 oz (112-168 g) of lean protein each day, such as lean meat, chicken, fish, eggs, or tofu. One ounce (oz) (28 g) of lean protein is equal to: 1 oz (28 g) of meat, chicken, or fish. 1 egg.  cup (62 g) of tofu. Eat some foods each day that contain healthy fats, such as avocado, nuts, seeds, and fish. What foods should I eat? Fruits Berries. Apples. Oranges. Peaches. Apricots. Plums. Grapes. Mangoes. Papayas. Pomegranates. Kiwi. Cherries. Vegetables Leafy greens, including lettuce, spinach, kale, chard, collard greens, mustard greens, and cabbage. Beets. Cauliflower. Broccoli. Carrots. Green beans. Tomatoes. Peppers. Onions. Cucumbers. Brussels sprouts. Grains Whole grains, such as whole-wheat or whole-grain bread, crackers, tortillas, cereal, and pasta. Unsweetened oatmeal. Quinoa. Brown or wild rice. Meats and other proteins Seafood. Poultry without skin. Lean cuts of poultry and beef. Tofu. Nuts. Seeds. Dairy Low-fat or fat-free dairy products such as milk, yogurt, and cheese. The items listed above may not be a complete list of foods and beverages you can eat and drink. Contact a dietitian for more information. What  foods should I  avoid? Fruits Fruits canned with syrup. Vegetables Canned vegetables. Frozen vegetables with butter or cream sauce. Grains Refined white flour and flour products such as bread, pasta, snack foods, and cereals. Avoid all processed foods. Meats and other proteins Fatty cuts of meat. Poultry with skin. Breaded or fried meats. Processed meat. Avoid saturated fats. Dairy Full-fat yogurt, cheese, or milk. Beverages Sweetened drinks, such as soda or iced tea. The items listed above may not be a complete list of foods and beverages you should avoid. Contact a dietitian for more information. Questions to ask a health care provider Do I need to meet with a certified diabetes care and education specialist? Do I need to meet with a dietitian? What number can I call if I have questions? When are the best times to check my blood glucose? Where to find more information: American Diabetes Association: diabetes.org Academy of Nutrition and Dietetics: eatright.[employer] of Diabetes and Digestive and Kidney Diseases: [employer] Association of Diabetes Care & Education Specialists: diabeteseducator.org Summary It is important to have healthy eating habits because your blood sugar (glucose) levels are greatly affected by what you eat and drink. It is important to use alcohol carefully. A healthy meal plan will help you manage your blood glucose and lower your risk of heart disease. Your health care provider may recommend that you work with a dietitian to make a meal plan that is best for you. This information is not intended to replace advice given to you by your health care provider. Make sure you discuss any questions you have with your health care provider. Document Revised: 10/12/2019 Document Reviewed: 10/12/2019 Elsevier Patient Education  2022 [place], MD [facility]

## 2021-02-24 ENCOUNTER — Other Ambulatory Visit: Payer: Self-pay | Admitting: Internal Medicine

## 2021-02-26 ENCOUNTER — Encounter: Payer: Self-pay | Admitting: Orthopaedic Surgery

## 2021-02-26 ENCOUNTER — Other Ambulatory Visit: Payer: Self-pay

## 2021-02-26 ENCOUNTER — Ambulatory Visit: Payer: 59 | Admitting: Orthopaedic Surgery

## 2021-02-26 DIAGNOSIS — S73191A Other sprain of right hip, initial encounter: Secondary | ICD-10-CM

## 2021-02-26 NOTE — Progress Notes (Signed)
   Office Visit Note   Patient: Aaron Woods           Date of Birth: December 16, 1985           MRN: [id number] Visit Date: 02/26/2021              Requested by: Georgina Quint, MD [address] [place],  [place] [address] PCP: Georgina Quint, MD   Assessment & Plan: Visit Diagnoses:  1. Tear of right acetabular labrum, initial encounter     Plan: Impression is right hip labral tear.  At this point, I discussed referral to Dr. Alvester Morin for intra-articular cortisone injection.  If he fails to get relief from this we have discussed MR arthrogram for further assessment of the labrum.  He will follow-up with [place] as needed.  Call with concerns or questions.  Follow-Up Instructions: Return if symptoms worsen or fail to improve.   Orders:  No orders of the defined types were placed in this encounter.  No orders of the defined types were placed in this encounter.     Procedures: No procedures performed   Clinical Data: No additional findings.   Subjective: Chief Complaint  Patient presents with   Right Hip - Pain    HPI patient is a pleasant 35 year old gentleman who comes in today with 6 months of right hip pain.  Around this time, he jumped from his knees to his feet causing a pop and pain to the right lateral hip/groin.  He has had intermittent pain since.  The pain seems to be worse with certain pivoting motions.  He has been taking Aleve without significant relief.  He denies any numbness, tingling or burning.  No pain to the buttocks or anterior thigh.  Review of Systems as detailed in HPI.  All others reviewed and are negative.   Objective: Vital Signs: There were no vitals taken for this visit.  Physical Exam well-developed well-nourished gentleman in no acute distress.  Alert and oriented x3.  Ortho Exam right hip exam shows a markedly positive logroll and FADIR.  Negative straight leg raise.  No focal weakness.  He is neurovascular intact  distally.  Specialty Comments:  No specialty comments available.  Imaging: X-rays reviewed by me in canopy of the right hip showed no acute or structural abnormalities.   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 No pertinent surgical history. Social History   Occupational History   Not on file  Tobacco Use   Smoking status: Never   Smokeless tobacco: Never  Substance and Sexual Activity   Alcohol use: No   Drug use: No   Sexual activity: Not on file

## 2021-02-27 ENCOUNTER — Ambulatory Visit: Payer: 59 | Admitting: Neurology

## 2021-02-27 ENCOUNTER — Encounter: Payer: Self-pay | Admitting: Neurology

## 2021-02-27 VITALS — BP 122/81 | HR 84 | Ht 69.0 in | Wt 250.5 lb

## 2021-02-27 DIAGNOSIS — G4730 Sleep apnea, unspecified: Secondary | ICD-10-CM

## 2021-02-27 DIAGNOSIS — G4710 Hypersomnia, unspecified: Secondary | ICD-10-CM | POA: Diagnosis not present

## 2021-02-27 DIAGNOSIS — R0683 Snoring: Secondary | ICD-10-CM | POA: Insufficient documentation

## 2021-02-27 DIAGNOSIS — G9331 Postviral fatigue syndrome: Secondary | ICD-10-CM | POA: Insufficient documentation

## 2021-02-27 DIAGNOSIS — G4719 Other hypersomnia: Secondary | ICD-10-CM | POA: Insufficient documentation

## 2021-02-27 NOTE — Progress Notes (Signed)
Name: Aaron Woods  Age/ Sex: 35 y.o., male   MRN/ DOB: [id number], 08/05/1985     PCP: Horald Pollen, MD   Reason for Endocrinology Evaluation: Type 2 Diabetes Mellitus  Initial Endocrine Consultative Visit: 01/04/2019    PATIENT IDENTIFIER: Aaron Woods is a 35 y.o. male with a past medical history of T2DM and Dyslipidemia. The patient has followed with Endocrinology clinic since 01/04/2019 for consultative assistance with management of his diabetes.  DIABETIC HISTORY:  Mr. Boley was diagnosed with T2DM in 2006. Pt intolerant to BID dosing of Metformin. His hemoglobin A1c has ranged from 7.0 % , peaking at 13.4% in 2020.  On his initial visit to our clinic his A1c was 13.4%. He was on Levemir, victoza and Metformin. We replaced Levemir with Novolog Mix. He stopped Metformin by 06/2019 due to intolerance issues    Jardiance started by PCP 02/2021  Lives with wife , 3 kids (20, 35 and 33)    Works in park enforcement   SUBJECTIVE:   During the last visit (07/19/2019): A1c 8.8%.  He declined add-on therapy with SGLT2 inhibitors.  Continued Victoza,  and increased humalog mix.      Today (02/28/2021): [name]  is here for a follow up on diabetes management. He has NOT been seen in 14 months.  He checks his blood sugars 2 times daily. The patient has not had hypoglycemic episodes .  The patient did not bring his meter today.   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 Idolina Primer)  Denies:  neuropathy and CKD  Last eye exam: Completed 05/04/2020   Macrovascular complications:     Denies: CAD, PVD, CVA     HISTORY:  Past Medical History:  Past Medical History:  Diagnosis Date   Borderline high blood pressure    Diabetes 1.5, managed as type 2 ([place])    Diabetes mellitus    Past Surgical History: No past surgical history on file. Social History:  reports that he has never smoked. He has never used smokeless tobacco. He reports that he does not drink alcohol and does not use drugs. Family History:  Family History  Problem Relation Age of Onset   Diabetes Mother    Diabetes Brother    Diabetes Brother    Diabetes Brother    Diabetes Maternal Grandmother      HOME MEDICATIONS: Allergies as of 02/28/2021       Reactions   Almond (diagnostic) Itching        Medication List        Accurate as of February 28, 2021  8:04 AM. If you have any questions, ask your nurse or doctor.          acetaminophen 325 MG tablet Commonly known as: TYLENOL Take 650 mg by mouth every 6 (six) hours as needed for mild pain or headache.   B-D UF III MINI PEN NEEDLES 31G X 5 MM Misc Generic drug: Insulin Pen Needle USE 1 PEN AS DIRECTED 1- 4 TIMES DAILY   blood glucose meter kit and supplies by Other route  as directed. Dispense based on patient and insurance preference. Use up to four times daily as directed. (FOR ICD-10 E10.9, E11.9).   empagliflozin 10 MG Tabs tablet Commonly known as: Jardiance Take 1 tablet (10 mg total) by mouth daily before breakfast.   Insulin Lispro Prot & Lispro (75-25) 100 UNIT/ML Kwikpen Commonly known as: HumaLOG Mix 75/25 KwikPen Inject 32 Units into the skin 2 (two) times daily with a meal. What changed: how much to take   rosuvastatin 20 MG tablet Commonly known as: Crestor Take 1 tablet (20 mg total) by mouth daily.   telmisartan-hydrochlorothiazide 80-12.5 MG tablet Commonly known as: MICARDIS HCT Take 1 tablet by mouth daily.   Victoza 18 MG/3ML Sopn Generic drug: liraglutide INJECT 1.2 MG INTO THE SKIN DAILY          OBJECTIVE:   Vital Signs: BP 136/82 (BP Location: Left Arm, Patient Position: Sitting, Cuff Size: Large)   Pulse 86   Ht _0  (1.753 m)   Wt 251 lb (113.9 kg)   SpO2 99%   BMI 37.07 kg/m   Wt Readings from Last 3 Encounters:  02/28/21 251 lb (113.9 kg)  02/27/21 250 lb 8 oz (113.6 kg)  02/21/21 259 lb (117.5 kg)     Exam: General: Pt appears well and is in NAD  Lungs: Clear with good BS bilat with no rales, rhonchi, or wheezes  Heart: RRR with normal S1 and S2 and no gallops; no murmurs; no rub  Abdomen:  soft, nontender, without masses or organomegaly palpable  Extremities: Trace pretibial edema.   Neuro: MS is good with appropriate affect, pt is alert and Ox3    DM foot exam: 02/28/2021   The skin of the feet is intact without sores or ulcerations. The pedal pulses are 2+ on right and 2+ on left. The sensation is intact to a screening 5.07, 10 gram monofilament bilaterally       DATA REVIEWED:  Lab Results  Component Value Date   HGBA1C 11.4 (A) 02/21/2021   HGBA1C 9.5 (H) 10/12/2020   HGBA1C 8.8 (H) 12/01/2019   Lab Results  Component Value Date   MICROALBUR 19.5 (H) 10/12/2020   LDLCALC 98 10/12/2020   CREATININE 0.93 10/12/2020    Lab Results  Component Value Date   CHOL 165 10/12/2020   HDL 33.60 (L) 10/12/2020   LDLCALC 98 10/12/2020   TRIG 167.0 (H) 10/12/2020   CHOLHDL 5 10/12/2020        In-office BG was 186 mg/dL    ASSESSMENT / PLAN / RECOMMENDATIONS:   1) ) Type 2 Diabetes Mellitus, Poorly controlled, With retinopathic complications - Most recent A1c of 11.4 %. Goal A1c < 7.0 %.     -Poorly controlled diabetes due to dietary indiscretions  - We discussed microvascular complication of uncontrolled DM to include blindeness ESRD , neuropathy and increased risk of amputations  - He is tolerating Jardiance, will increase on the next pick up  - Will increase Victoza  - Will switch insulin mix to Basal insulin as below  - He is  intolerant to metformin    MEDICATIONS: - STOP Humalog  Mix  - Start Tresiba 40 units once daily  - Increase  Victoza 1.8 mg daily  - Increase Jardiance 25 mg daily    EDUCATION / INSTRUCTIONS: BG monitoring instructions: Patient is instructed to check his blood sugars 2 times a day, before breakfast and supper. Call [facility] if: BG persistently < 70  I  reviewed the Rule of 15 for the treatment of hypoglycemia in detail with the patient. Literature supplied.    2) Diabetic complications:  Eye: Does have known diabetic retinopathy.  Patient urged to contact his ophthalmologist for a follow-up as he has not been seen in 10 months. Neuro/ Feet: Does not have known diabetic peripheral neuropathy .  Renal: Patient does not have known baseline CKD. He   is not on an ACEI/ARB at present.      F/U in 4 months    Signed electronically by: Mack Guise, MD  [facility]  [facility] [address] [place], [place] [address] Phone: [phone] FAX: [phone]   CC: Horald Pollen, MD [place] [id number] Phone: [phone]  Fax: [phone]  Return to Endocrinology clinic as below: Future Appointments  Date Time Provider [place]  05/27/2021  8:00 AM Horald Pollen, MD [facility] None

## 2021-02-27 NOTE — Progress Notes (Signed)
SLEEP MEDICINE CLINIC    Provider:  Larey Seat, MD  Primary Care Physician:  Horald Pollen, MD [place] [address]     Referring Provider: Agustina Caroli [place], [place],  [place] [address]          Chief Complaint according to patient   Patient presents with:     New Patient (Initial Visit)           HISTORY OF PRESENT ILLNESS:  Aaron Woods is a 35 y.o. AA male patient who is seen here upon referral on 02/27/2021 from Dr Mitchel Honour, MD  for a sleep consultation. .  Chief concern according to patient :  Internal referral for suspected sleep apnea. Pt reports loud snoring since 2012. Has trouble getting sleep. Pts wife has witnessed him to stop breathing at night. Daytime sleepiness and fatigue all day.    I have the pleasure of seeing Aaron Woods on 02-27-2021, a right -handed  African American male with a possible sleep disorder. He  has a past medical history of Borderline high blood pressure, Diabetes 1.5, Dx in 2006,  managed as type 2 ([place]), and Diabetes mellitus. Had COVID pneumonia , hospitalized 9/  2021. Was unvaccinated at the time. He has a hip injury , and has been followed bu endocrinologist Dr Kelton Pillar.      Sleep relevant medical history:  obesity- weight gain over the last 18 months.    Family medical /sleep history: no other family member on CPAP with OSA, insomnia, sleep walkers.    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   and lives in a household with spouse and 3 children, no pats.   Tobacco use: none .  ETOH use :none ,  Caffeine intake in form of Coffee( /) Soda( 1 a week) Tea ( in AM ) or energy drinks. Regular exercise in form of GYm and walking.        Sleep habits are as follows: The patient's dinner time is between 6.30 PM. The patient goes to bed at 10 PM and continues to sleep for a restless 4-5 hours hours, sometime wakes for  bathroom  breaks. Bedroom is cool, quiet , and dark.  The preferred sleep position is prone , with the support of 1 pillow. Dreams are reportedly rare.  5  AM is the usual rise time.  The patient wakes up with an alarm, he is very hard to get up. Marland Kitchen  He reports not feeling refreshed or restored in AM, with symptoms such as dry mouth, morning headaches, and residual fatigue.  Naps are taken about once a week,on weekends.Naps lasting from 1-2 hours and are less refreshing than nocturnal sleep.    Review of Systems: Out of a complete 14 system review, the patient complains of only the following symptoms, and all other reviewed systems are negative.:  Fatigue, sleepiness , snoring, fragmented sleep, Insomnia - trouble to sleep through. Between 4-5 AM   How likely are you to doze in the following situations: 0 = not likely, 1 = slight chance, 2 = moderate chance, 3 = high chance   Sitting and Reading? Watching Television? Sitting inactive in a public place (theater or meeting)? As a passenger in a car for an hour without a break? Lying down in the afternoon when circumstances permit? Sitting and talking to someone? Sitting quietly after lunch without alcohol? In a car,  while stopped for a few minutes in traffic?   Total = 15/ 24 points   FSS endorsed at 43/ 63 points.   Social History   Socioeconomic History   Marital status: Married    Spouse name: [name]   Number of children: 3   Years of education: Not on file   Highest education level: High school graduate  Occupational History   Not on file  Tobacco Use   Smoking status: Never   Smokeless tobacco: Never  Substance and Sexual Activity   Alcohol use: No   Drug use: No   Sexual activity: Not on file  Other Topics Concern   Not on file  Social History Narrative   Live at home with wife and 3 kids   Right handed   Caffeine: 1 cup of tea in the AM   Social Determinants of Health   Financial Resource Strain: Not on file  Food  Insecurity: Not on file  Transportation Needs: Not on file  Physical Activity: Not on file  Stress: Not on file  Social Connections: Not on file    Family History  Problem Relation Age of Onset   Diabetes Mother    Diabetes Brother    Diabetes Brother    Diabetes Brother    Diabetes Maternal Grandmother     Past Medical History:  Diagnosis Date   Borderline high blood pressure    Diabetes 1.5, managed as type 2 ([place])    Diabetes mellitus     History reviewed. No pertinent surgical history.   Current Outpatient Medications on File Prior to Visit  Medication Sig Dispense Refill   acetaminophen (TYLENOL) 325 MG tablet Take 650 mg by mouth every 6 (six) hours as needed for mild pain or headache.     blood glucose meter kit and supplies by Other route as directed. Dispense based on patient and insurance preference. Use up to four times daily as directed. (FOR ICD-10 E10.9, E11.9).     empagliflozin (JARDIANCE) 10 MG TABS tablet Take 1 tablet (10 mg total) by mouth daily before breakfast. 90 tablet 3   Insulin Lispro Prot & Lispro (HUMALOG MIX 75/25 KWIKPEN) (75-25) 100 UNIT/ML Kwikpen Inject 32 Units into the skin 2 (two) times daily with a meal. 60 mL 0   Insulin Pen Needle (B-D UF III MINI PEN NEEDLES) 31G X 5 MM MISC USE 1 PEN AS DIRECTED 1- 4 TIMES DAILY 100 each 0   rosuvastatin (CRESTOR) 20 MG tablet Take 1 tablet (20 mg total) by mouth daily. 90 tablet 3   telmisartan-hydrochlorothiazide (MICARDIS HCT) 80-12.5 MG tablet Take 1 tablet by mouth daily. 90 tablet 3   VICTOZA 18 MG/3ML SOPN INJECT 1.2 MG INTO THE SKIN DAILY 6 mL 0   No current facility-administered medications on file prior to visit.    Allergies  Allergen Reactions   Almond (Diagnostic) Itching    Physical exam:  Today's Vitals   02/27/21 1256  BP: 122/81  Pulse: 84  Weight: 250 lb 8 oz (113.6 kg)  Height: 5' 9"  (1.753 m)   Body mass index is 36.99 kg/m.   Wt Readings from Last 3 Encounters:   02/27/21 250 lb 8 oz (113.6 kg)  02/21/21 259 lb (117.5 kg)  11/22/20 255 lb (115.7 kg)     Ht Readings from Last 3 Encounters:  02/27/21 5' 9"  (1.753 m)  02/21/21 5' 9"  (1.753 m)  11/22/20 5' 9"  (1.753 m)      General: The patient  is awake, alert and appears not in acute distress. The patient is well groomed. Head: Normocephalic, atraumatic. Neck is supple.   Mallampati 3,  neck circumference:18.5  inches . Nasal airflow barely patent.  Retrognathia is not seen. Facial hair.  Dental status: intact.  Cardiovascular:  Regular rate and cardiac rhythm by pulse,  without distended neck veins. Respiratory: Lungs are clear to auscultation.  Skin:  Without evidence of ankle edema, or rash. Trunk: The patient's posture is erect.   Neurologic exam : The patient is awake and alert, oriented to place and time.   Memory subjective described as intact.  Attention span & concentration ability appears normal.  Speech is fluent,  without  dysarthria, dysphonia or aphasia.  Mood and affect are appropriate.   Cranial nerves: no loss of smell or taste reported  Pupils are equal and briskly reactive to light. Funduscopic exam deferred. .  Extraocular movements in vertical and horizontal planes were intact and without nystagmus. No Diplopia. Visual fields by finger perimetry are intact. Hearing was intact to soft voice and finger rubbing.    Facial sensation intact to fine touch.  Facial motor strength is symmetric and tongue and uvula move midline.  Neck ROM : rotation, tilt and flexion extension were normal for age and shoulder shrug was symmetrical.    Motor exam:  Symmetric bulk, tone and ROM.   Normal tone without cog- wheeling, symmetric grip strength .   Sensory:  Fine touch, pinprick and vibration were tested  and  normal.  Proprioception tested in the upper extremities was normal.   Coordination: Rapid alternating movements in the fingers/hands were of normal speed.  The  Finger-to-nose maneuver was intact without evidence of ataxia, dysmetria or tremor.   Gait and station: Patient could rise unassisted from a seated position, walked without assistive device.  Stance is of normal width/ base and the patient turned with 3 steps.  Toe and heel walk were deferred.  Deep tendon reflexes: in the  upper and lower extremities were trace only- DTR in  patella absent - achilles tendon absent Babinski response was deferred.        After spending a total time of  45  minutes face to face and additional time for physical and neurologic examination, review of laboratory studies,  personal review of imaging studies, reports and results of other testing and review of referral information / records as far as provided in visit, I have established the following assessments:  I have the pleasure of meeting Aaron Woods. Loy today again on 27 February 2021 and in short this gentleman has a history of snoring for probably well over a decade, but only recently has he felt a latent fatigue this started in summer last year and as I learned he did contract COVID at the time  1 )I think there must be a postviral illness contributing overall to his nonrestorative sleep.  2) excessive daytime sleepiness reflected in an Epworth sleepiness score of 15 points, excessive daytime fatigue reflected in a fatigue severity score of 43 points.  Reported snoring and witnessed apneas per spouse.    3) The patient also wakes up with morning headaches that dissipate on their own.   He does have a very dry mouth.   4) He is a rather restless sleeper wakes up frequently with difficulties to go back to sleep especially if it is coming close to the morning hours.  Overall he estimates to get only 4 maybe 5 hours of  sleep many nights.  The patient is a diabetic. He was areflexic.   Review of his medical dictations did not show sedating or other medications that would explain an excessive amount of  sleepiness.     My Plan is to proceed with:  1) I will ask Mr. Suazo to be ready for home sleep test.  I do not think we will gain much in evaluating his sleep in an in lab environment.  The home sleep test will allow me to differentiate central and obstructive central and obstructive apneas, central and obstructive apneas, apneas by sleep stage, it will also help me to see if there is any hypoxemia associated.   I would like to thank Horald Pollen, MD and Agustina Caroli [place], [place] [address] [place],  [place] [address] for allowing me to meet with and to take care of this pleasant patient.   In short, AIRIK GOODLIN is presenting with likely OSA,  I plan to follow up either personally or through our NP within 2-4  month.   CC: I will share my notes with PCP.  Electronically signed by: Larey Seat, MD 02/27/2021 1:15 PM  [facility] Board certified by The [employer] of Sleep Medicine and Diplomate of the [employer] of Sleep Medicine. Board certified In Neurology through the [employer], Fellow of the [employer] of Neurology. Medical Director of [facility].

## 2021-02-27 NOTE — Patient Instructions (Signed)
Screening for Sleep Apnea Sleep apnea is a condition in which breathing pauses or becomes shallow during sleep. Sleep apnea screening is a test to determine if you are at risk for sleep apnea. The test includes a series of questions. It will only takes a few minutes. Your health care provider may ask you to have this test in preparation for surgery or as part of a physical exam. What are the symptoms of sleep apnea? Common symptoms of sleep apnea include: Snoring. Waking up often at night. Daytime sleepiness. Pauses in breathing. Choking or gasping during sleep. Irritability. Forgetfulness. Trouble thinking clearly. Depression. Personality changes. Most people with sleep apnea do not know that they have it. What are the advantages of sleep apnea screening? Getting screened for sleep apnea can help: Ensure your safety. It is important for your health care providers to know whether or not you have sleep apnea, especially if you are having surgery or have other long-term (chronic) health conditions. Improve your health and allow you to get a better night's rest. Restful sleep can help you: Have more energy. Lose weight. Improve high blood pressure. Improve diabetes management. Prevent stroke. Prevent car accidents. What happens during the screening? Screening usually includes being asked a list of questions about your sleep quality. Some questions you may be asked include: Do you snore? Is your sleep restless? Do you have daytime sleepiness? Has a partner or spouse told you that you stop breathing during sleep? Have you had trouble concentrating or memory loss? What is your age? What is your neck circumference? To measure your neck, keep your back straight and gently wrap the tape measure around your neck. Put the tape measure at the middle of your neck, between your chin and collarbone. What is your sex assigned at birth? Do you have or are you being treated for high blood  pressure? If your screening test is positive, you are at risk for the condition. Further testing may be needed to confirm a diagnosis of sleep apnea. Where to find more information You can find screening tools online or at your health care clinic. For more information about sleep apnea screening and healthy sleep, visit these websites: Centers for Disease Control and Prevention: www.cdc.gov American Sleep Apnea Association: www.sleepapnea.org Contact a health care provider if: You think that you may have sleep apnea. Summary Sleep apnea screening can help determine if you are at risk for sleep apnea. It is important for your health care providers to know whether or not you have sleep apnea, especially if you are having surgery or have other chronic health conditions. You may be asked to take a screening test for sleep apnea in preparation for surgery or as part of a physical exam. This information is not intended to replace advice given to you by your health care provider. Make sure you discuss any questions you have with your health care provider. Document Revised: 02/17/2020 Document Reviewed: 02/17/2020 Elsevier Patient Education  2022 Elsevier Inc.  

## 2021-02-28 ENCOUNTER — Encounter: Payer: Self-pay | Admitting: Internal Medicine

## 2021-02-28 ENCOUNTER — Ambulatory Visit: Payer: 59 | Admitting: Internal Medicine

## 2021-02-28 ENCOUNTER — Other Ambulatory Visit: Payer: Self-pay

## 2021-02-28 VITALS — BP 136/82 | HR 86 | Ht 69.0 in | Wt 251.0 lb

## 2021-02-28 DIAGNOSIS — E113413 Type 2 diabetes mellitus with severe nonproliferative diabetic retinopathy with macular edema, bilateral: Secondary | ICD-10-CM | POA: Diagnosis not present

## 2021-02-28 DIAGNOSIS — R739 Hyperglycemia, unspecified: Secondary | ICD-10-CM | POA: Diagnosis not present

## 2021-02-28 DIAGNOSIS — Z794 Long term (current) use of insulin: Secondary | ICD-10-CM

## 2021-02-28 DIAGNOSIS — E1165 Type 2 diabetes mellitus with hyperglycemia: Secondary | ICD-10-CM | POA: Diagnosis not present

## 2021-02-28 LAB — GLUCOSE, POCT (MANUAL RESULT ENTRY): POC Glucose: 186 mg/dl — AB (ref 70–99)

## 2021-02-28 MED ORDER — EMPAGLIFLOZIN 25 MG PO TABS: 25.0000 mg | ORAL_TABLET | Freq: Every day | ORAL | 2 refills | Status: DC

## 2021-02-28 MED ORDER — TRESIBA FLEXTOUCH 200 UNIT/ML ~~LOC~~ SOPN: 40.0000 [IU] | PEN_INJECTOR | Freq: Every day | SUBCUTANEOUS | 6 refills | Status: DC

## 2021-02-28 MED ORDER — BD PEN NEEDLE MICRO U/F 32G X 6 MM MISC: 1.0000 | Freq: Every day | 2 refills | Status: DC

## 2021-02-28 MED ORDER — VICTOZA 18 MG/3ML ~~LOC~~ SOPN: 1.8000 mg | PEN_INJECTOR | Freq: Every day | SUBCUTANEOUS | 2 refills | Status: DC

## 2021-02-28 MED ORDER — ONETOUCH VERIO VI STRP: 1.0000 | ORAL_STRIP | Freq: Every day | 3 refills | Status: DC

## 2021-02-28 NOTE — Patient Instructions (Signed)
-   STOP Humalog  Mix  - Start Tresiba 40 units once daily  - Increase  Victoza 1.8 mg daily  - Increase Jardiance 25 mg daily       HOW TO TREAT LOW BLOOD SUGARS (Blood sugar LESS THAN 70 MG/DL) Please follow the RULE OF 15 for the treatment of hypoglycemia treatment (when your (blood sugars are less than 70 mg/dL)   STEP 1: Take 15 grams of carbohydrates when your blood sugar is low, which includes:  3-4 GLUCOSE TABS  OR 3-4 OZ OF JUICE OR REGULAR SODA OR ONE TUBE OF GLUCOSE GEL    STEP 2: RECHECK blood sugar in 15 MINUTES STEP 3: If your blood sugar is still low at the 15 minute recheck --> then, go back to STEP 1 and treat AGAIN with another 15 grams of carbohydrates.

## 2021-03-04 ENCOUNTER — Telehealth: Payer: Self-pay | Admitting: Physical Medicine and Rehabilitation

## 2021-03-04 NOTE — Telephone Encounter (Signed)
Pt's wife Genene Churn returned called to [facility] to set an appt. Please call [phone]. To set appt.

## 2021-03-13 ENCOUNTER — Ambulatory Visit (INDEPENDENT_AMBULATORY_CARE_PROVIDER_SITE_OTHER): Payer: 59 | Admitting: Neurology

## 2021-03-13 DIAGNOSIS — G4733 Obstructive sleep apnea (adult) (pediatric): Secondary | ICD-10-CM | POA: Diagnosis not present

## 2021-03-13 DIAGNOSIS — G4710 Hypersomnia, unspecified: Secondary | ICD-10-CM

## 2021-03-13 DIAGNOSIS — G4734 Idiopathic sleep related nonobstructive alveolar hypoventilation: Secondary | ICD-10-CM

## 2021-03-13 DIAGNOSIS — R0683 Snoring: Secondary | ICD-10-CM

## 2021-03-13 DIAGNOSIS — G4719 Other hypersomnia: Secondary | ICD-10-CM

## 2021-03-13 DIAGNOSIS — G9331 Postviral fatigue syndrome: Secondary | ICD-10-CM

## 2021-03-13 DIAGNOSIS — G4730 Sleep apnea, unspecified: Secondary | ICD-10-CM

## 2021-03-17 ENCOUNTER — Encounter: Payer: Self-pay | Admitting: Neurology

## 2021-03-19 ENCOUNTER — Telehealth: Payer: Self-pay | Admitting: Pharmacy Technician

## 2021-03-19 ENCOUNTER — Other Ambulatory Visit (HOSPITAL_COMMUNITY): Payer: Self-pay

## 2021-03-19 NOTE — Progress Notes (Signed)
[facility] SLEEP TEST REPORT ( by Watch PAT)   STUDY DATE:  03-19-2021   ORDERING CLINICIAN: Melvyn Novas, MD  REFERRING CLINICIAN: Dr Alvy Bimler, MD   CLINICAL INFORMATION/HISTORY: Aaron Woods is a 35 y.o. AA male patient who is seen here upon referral on 02/27/2021 from Dr Alvy Bimler, MD.I think there must be a postviral illness contributing overall to his nonrestorative sleep, his Excessive daytime sleepiness reflected in an Epworth sleepiness score of 15 points, his excessive daytime fatigue reflected in a fatigue severity score of 43 points.   Reported snoring and witnessed apneas per spouse.    The patient also wakes up with morning headaches that dissipate on their own.   He does have a very dry mouth. He is diabetic. He is a rather restless sleeper wakes up frequently with difficulties to go back to sleep especially if it is coming close to the morning hours.   Overall, he estimates to get only 4 maybe 5 hours of sleep many nights.     Epworth sleepiness score: 15/ 24 points   FSS endorsed at 43/ 63 points.    BMI:  37 kg/m   Neck Circumference: 19"   FINDINGS:   Sleep Summary:   Total Recording Time (hours, min): The total recording time for this home sleep test was 6 hours and 49 minutes of which 6 hours and 21 minutes were calculated total sleep time.       Percent REM (%):    Was not established                                    Respiratory Indices:   Calculated pAHI (per hour): 39.7/h which constitutes severe sleep apnea.  Whenever the REM sleep proportion is under 5% of the total sleep time this device does not give data about the AHI in REM versus non-REM sleep.   Apparently ,the chest wall electrode also failed as there was no evidence of data recording for snoring nor for sleep position.                                                          Oxygen Saturation Statistics:   O2 Saturation Range (%):  Varied between and nadir of 51% and a maximum of 99% saturation, with a  mean oxygen saturation at 89% only.                                      O2 Saturation (minutes) <89%: 104 minutes, the equivalent of 27.3% of total sleep time.  This is significant hypoxia time under 85% saturation was still 42 minutes.  Hypoxia under 90% saturation amounted 240 minutes the equivalent of 37% of total sleep time.          Pulse Rate Statistics:   Pulse Range:   Varied between 53 and 120 bpm with a mean heart rate of 92 which is an elevated resting heart rate.  Reviewing the hypnogram, there were several periods of excessive oxygen desaturation, pulse rate variation and resulting apnea indices.  Unfortunately we do not know in which position or if this is related to REM sleep.    What we know is that the patient has excessive low oxygen saturation.  Given the overall AHI this patient should start on CPAP.              IMPRESSION:  This HST confirms the presence of severe sleep apnea and suggests longstanding and severe hypoxia to p be present. We were unable to see positional data and also REM / NREM sleep breakdown.    RECOMMENDATION: Auto- titrating CPAP with a setting from 6 to 18 cm water pressure 3 cm EPR, heated humidification, mask of patient's choice.    INTERPRETING PHYSICIAN:   Melvyn Novas, MD   Medical Director of [facility] at [facility].

## 2021-03-19 NOTE — Telephone Encounter (Signed)
Patient Advocate Encounter  Received notification from [employer] ([employer]) that prior authorization for VICTOZA 18MG  is required.   PA submitted on 12.27.22 Key [id number] Status is pending   [facility] will continue to follow  12.29.22, CPhT Patient Advocate [facility] Phone: [phone] Fax:  [phone]

## 2021-03-20 ENCOUNTER — Other Ambulatory Visit (HOSPITAL_COMMUNITY): Payer: Self-pay

## 2021-03-20 NOTE — Telephone Encounter (Addendum)
Received notification from [employer] [facility]) regarding a prior authorization for VICTOZA 18MG  (3 X ). Authorization has been APPROVED from 12.27.22 to 12.27.23.   Per test claim, copay for 30 days supply is $50   Authorization #  [id number]

## 2021-03-28 ENCOUNTER — Ambulatory Visit: Payer: Self-pay

## 2021-03-28 ENCOUNTER — Ambulatory Visit (INDEPENDENT_AMBULATORY_CARE_PROVIDER_SITE_OTHER): Payer: 59 | Admitting: Physical Medicine and Rehabilitation

## 2021-03-28 ENCOUNTER — Encounter: Payer: Self-pay | Admitting: Physical Medicine and Rehabilitation

## 2021-03-28 ENCOUNTER — Other Ambulatory Visit: Payer: Self-pay

## 2021-03-28 DIAGNOSIS — M25551 Pain in right hip: Secondary | ICD-10-CM

## 2021-03-28 MED ORDER — TRIAMCINOLONE ACETONIDE 40 MG/ML IJ SUSP
60.0000 mg | INTRAMUSCULAR | Status: AC | PRN
  Administered 2021-03-28: 60 mg via INTRA_ARTICULAR

## 2021-03-28 MED ORDER — BUPIVACAINE HCL 0.25 % IJ SOLN
4.0000 mL | INTRAMUSCULAR | Status: AC | PRN
  Administered 2021-03-28: 4 mL via INTRA_ARTICULAR

## 2021-03-28 NOTE — Progress Notes (Signed)
Pt state right hip pain. Pt state bending and sitting makes the pain worse. Pt state he takes pain meds to help ease his pain.  Numeric Pain Rating Scale and Functional Assessment Average Pain 5   In the last MONTH (on 0-10 scale) has pain interfered with the following?  1. General activity like being  able to carry out your everyday physical activities such as walking, climbing stairs, carrying groceries, or moving a chair?  Rating(8)   -BT, -Dye Allergies.

## 2021-03-28 NOTE — Progress Notes (Signed)
° °  Aaron Woods - 36 y.o. male MRN [id number]  Date of birth: 1985-06-06  Office Visit Note: Visit Date: 03/28/2021 PCP: Georgina Quint, MD Referred by: Georgina Quint, *  Subjective: Chief Complaint  Patient presents with   Right Hip - Pain   HPI:  Aaron Woods is a 36 y.o. male who comes in today at the request of Dr. Glee Arvin for planned Right anesthetic hip arthrogram with fluoroscopic guidance.  The patient has failed conservative care including home exercise, medications, time and activity modification.  This injection will be diagnostic and hopefully therapeutic.  Please see requesting physician notes for further details and justification.   ROS Otherwise per HPI.  Assessment & Plan: Visit Diagnoses:    ICD-10-CM   1. Pain in right hip  M25.551 XR C-ARM NO REPORT      Plan: No additional findings.   Meds & Orders: No orders of the defined types were placed in this encounter.   Orders Placed This Encounter  Procedures   Large Joint Inj   XR C-ARM NO REPORT    Follow-up: Return for visit to requesting provider as needed.   Procedures: Large Joint Inj: R hip joint on 03/28/2021 8:59 AM Indications: diagnostic evaluation and pain Details: 22 G 3.5 in needle, fluoroscopy-guided anterior approach  Arthrogram: No  Medications: 4 mL bupivacaine 0.25 %; 60 mg triamcinolone acetonide 40 MG/ML Outcome: tolerated well, no immediate complications  There was excellent flow of contrast producing a partial arthrogram of the hip. The patient did have relief of symptoms during the anesthetic phase of the injection. Procedure, treatment alternatives, risks and benefits explained, specific risks discussed. Consent was given by the patient. Immediately prior to procedure a time out was called to verify the correct patient, procedure, equipment, support staff and site/side marked as required. Patient was prepped and draped in the usual sterile fashion.          Clinical History: No specialty comments available.     Objective:  VS:  HT:     WT:    BMI:      BP:    HR: bpm   TEMP: ( )   RESP:  Physical Exam   Imaging: No results found.

## 2021-04-01 ENCOUNTER — Telehealth: Payer: Self-pay | Admitting: Neurology

## 2021-04-01 ENCOUNTER — Encounter: Payer: Self-pay | Admitting: Neurology

## 2021-04-01 DIAGNOSIS — G4734 Idiopathic sleep related nonobstructive alveolar hypoventilation: Secondary | ICD-10-CM | POA: Insufficient documentation

## 2021-04-01 DIAGNOSIS — G4733 Obstructive sleep apnea (adult) (pediatric): Secondary | ICD-10-CM | POA: Insufficient documentation

## 2021-04-01 NOTE — Progress Notes (Signed)
Reviewing the hypnogram, there were several periods of excessive oxygen desaturation, pulse rate variation and resulting apnea indices.  Unfortunately we do not know in which position or if this is related to REM sleep.    What we know is that the patient has excessive low oxygen saturation.  Given the overall AHI this patient should start on CPAP.  IMPRESSION:  This HST confirms the presence of severe sleep apnea and suggests longstanding and severe hypoxia to be present. We were unable to see positional data and also REM / NREM sleep breakdown. HR Varied between 53 and 120 bpm with a mean heart rate of 92 bpm which is an elevated resting heart rate.  RECOMMENDATION: Auto- titrating CPAP with a setting from 6 to 18 cm water pressure 3 cm EPR, heated humidification, mask of patient's choice.   INTERPRETING PHYSICIAN:   Melvyn Novas, MD

## 2021-04-01 NOTE — Procedures (Signed)
° ° ° ° °  °  °[facility] ° °Aaron Woods °  °HOME SLEEP TEST REPORT ( by Watch PAT)   °STUDY DATE:  03-19-2021 °  °ORDERING CLINICIAN: Esker Dever, MD  °REFERRING CLINICIAN: Dr Sagardia, MD °  °CLINICAL INFORMATION/HISTORY: Aaron Woods is a 36 y.o. AA male patient who is seen here upon referral on 02/27/2021 from Dr Sagardia, MD.I think there must be a postviral illness contributing overall to his nonrestorative sleep, his Excessive daytime sleepiness reflected in an Epworth sleepiness score of 15 points, his excessive daytime fatigue reflected in a fatigue severity score of 43 points.   °Reported snoring and witnessed apneas per spouse.   ° The patient also wakes up with morning headaches that dissipate on their own.  ° He does have a very dry mouth. He is diabetic. He is a rather restless sleeper wakes up frequently with difficulties to go back to sleep especially if it is coming close to the morning hours.  ° Overall, he estimates to get only 4 maybe 5 hours of sleep many nights. ° ° °  °Epworth sleepiness score: 15/ 24 points  ° FSS endorsed at 43/ 63 points.  °  °BMI:  37 kg/m² °  °Neck Circumference: 19" °  °FINDINGS: °  °Sleep Summary: °  °Total Recording Time (hours, min): The total recording time for this home sleep test was 6 hours and 49 minutes of which 6 hours and 21 minutes were calculated total sleep time.    °   °Percent REM (%):    Was not established                                  °  °Respiratory Indices: °  °Calculated pAHI (per hour): 39.7/h which constitutes severe sleep apnea.  Whenever the REM sleep proportion is under 5% of the total sleep time this device does not give data about the AHI in REM versus non-REM sleep.   °Apparently ,the chest wall electrode also failed as there was no evidence of data recording for snoring nor for sleep position.                          °                              °  °Oxygen Saturation Statistics: °  °O2 Saturation Range (%):  Varied between and nadir of 51% and a maximum of 99% saturation, with a  mean oxygen saturation at 89% only.                                    °  °O2 Saturation (minutes) <89%: 104 minutes, the equivalent of 27.3% of total sleep time.  This is significant hypoxia time under 85% saturation was still 42 minutes.  Hypoxia under 90% saturation amounted 240 minutes the equivalent of 37% of total sleep time.        °  °Pulse Rate Statistics: °  °Pulse Range:   Varied between 53 and 120 bpm with a mean heart rate of 92 which is an elevated resting heart rate. ° °Reviewing the hypnogram, there were several periods of excessive oxygen desaturation, pulse rate variation and resulting apnea indices.    Unfortunately we do not know in which position or if this is related to REM sleep.  ° ° What we know is that the patient has excessive low oxygen saturation.  Given the overall AHI this patient should start on CPAP.            °  °IMPRESSION:  This HST confirms the presence of severe sleep apnea and suggests longstanding and severe hypoxia to p be present. We were unable to see positional data and also REM / NREM sleep breakdown.  °  °RECOMMENDATION: Auto- titrating CPAP with a setting from 6 to 18 cm water pressure 3 cm EPR, heated humidification, mask of patient's choice. ° °  °INTERPRETING PHYSICIAN: ° ° Ry Moody, MD  ° °Medical Director of [facility].  ° ° ° ° ° ° ° ° ° ° ° ° ° ° ° ° ° ° ° ° ° °

## 2021-04-01 NOTE — Telephone Encounter (Signed)
-----   Message from Melvyn Novas, MD sent at 04/01/2021 12:38 PM EST ----- Reviewing the hypnogram, there were several periods of excessive oxygen desaturation, pulse rate variation and resulting apnea indices.  Unfortunately we do not know in which position or if this is related to REM sleep.    What we know is that the patient has excessive low oxygen saturation.  Given the overall AHI this patient should start on CPAP.  IMPRESSION:  This HST confirms the presence of severe sleep apnea and suggests longstanding and severe hypoxia to be present. We were unable to see positional data and also REM / NREM sleep breakdown. HR Varied between 53 and 120 bpm with a mean heart rate of 92 bpm which is an elevated resting heart rate.  RECOMMENDATION: Auto- titrating CPAP with a setting from 6 to 18 cm water pressure 3 cm EPR, heated humidification, mask of patient's choice.   INTERPRETING PHYSICIAN:   Melvyn Novas, MD

## 2021-04-01 NOTE — Addendum Note (Signed)
Addended by: Melvyn Novas on: 04/01/2021 12:38 PM   Modules accepted: Orders

## 2021-04-01 NOTE — Telephone Encounter (Signed)
I called pt. I advised pt that Dr. Brett Fairy reviewed their sleep study results and found that pt has severe sleep apnea. Dr. Brett Fairy recommends that pt starts auto CPAP. I reviewed PAP compliance expectations with the pt. Pt is agreeable to starting a CPAP. I advised pt that an order will be sent to a DME, [facility], and [facility] will call the pt within about one week after they file with the pt's insurance. [facility] will show the pt how to use the machine, fit for masks, and troubleshoot the CPAP if needed. A follow up appt was made for insurance purposes with Dr. Brett Fairy on 07/03/21 at 9:30 am. Pt verbalized understanding to arrive 15 minutes early and bring their CPAP. A letter with all of this information in it will be mailed to the pt as a reminder. I verified with the pt that the address we have on file is correct. Pt verbalized understanding of results. Pt had no questions at this time but was encouraged to call back if questions arise. I have sent the order to [facility] and have received confirmation that they have received the order.

## 2021-04-02 NOTE — Progress Notes (Signed)
CM sent to [facility] 

## 2021-04-17 ENCOUNTER — Telehealth: Payer: Self-pay

## 2021-04-17 ENCOUNTER — Other Ambulatory Visit (HOSPITAL_COMMUNITY): Payer: Self-pay

## 2021-04-17 DIAGNOSIS — E1165 Type 2 diabetes mellitus with hyperglycemia: Secondary | ICD-10-CM

## 2021-04-17 MED ORDER — VICTOZA 18 MG/3ML ~~LOC~~ SOPN: 1.8000 mg | PEN_INJECTOR | Freq: Every day | SUBCUTANEOUS | 11 refills | Status: DC

## 2021-04-17 NOTE — Addendum Note (Signed)
Addended by: Scarlette Shorts on: 04/17/2021 12:34 PM   Modules accepted: Orders

## 2021-04-17 NOTE — Telephone Encounter (Signed)
[facility] needs a PA

## 2021-04-17 NOTE — Telephone Encounter (Signed)
Patient wife advised and will have patient pick up medication

## 2021-04-18 ENCOUNTER — Telehealth: Payer: Self-pay | Admitting: Emergency Medicine

## 2021-04-18 NOTE — Telephone Encounter (Signed)
Connected to [facility] 1.25.2023.   last night wife was laying on patients chest and noticed his hb was irregular. denies cp at this time. Spouse had 101 heart rate last night laying in bed. Today it's been above 90 and up to 100.   Advised to call PCP within 24 hours.

## 2021-04-21 NOTE — Telephone Encounter (Signed)
Needs office visit for evaluation.  Thanks! 

## 2021-04-25 ENCOUNTER — Ambulatory Visit: Payer: 59 | Admitting: Emergency Medicine

## 2021-04-25 ENCOUNTER — Encounter: Payer: Self-pay | Admitting: Emergency Medicine

## 2021-04-25 ENCOUNTER — Other Ambulatory Visit: Payer: Self-pay

## 2021-04-25 VITALS — BP 128/82 | HR 80 | Temp 98.1°F | Ht 69.0 in | Wt 242.0 lb

## 2021-04-25 DIAGNOSIS — E1169 Type 2 diabetes mellitus with other specified complication: Secondary | ICD-10-CM

## 2021-04-25 DIAGNOSIS — E785 Hyperlipidemia, unspecified: Secondary | ICD-10-CM

## 2021-04-25 DIAGNOSIS — E113413 Type 2 diabetes mellitus with severe nonproliferative diabetic retinopathy with macular edema, bilateral: Secondary | ICD-10-CM | POA: Diagnosis not present

## 2021-04-25 DIAGNOSIS — E1159 Type 2 diabetes mellitus with other circulatory complications: Secondary | ICD-10-CM

## 2021-04-25 DIAGNOSIS — R000 Tachycardia, unspecified: Secondary | ICD-10-CM | POA: Insufficient documentation

## 2021-04-25 DIAGNOSIS — G4734 Idiopathic sleep related nonobstructive alveolar hypoventilation: Secondary | ICD-10-CM

## 2021-04-25 DIAGNOSIS — Z794 Long term (current) use of insulin: Secondary | ICD-10-CM

## 2021-04-25 DIAGNOSIS — G4733 Obstructive sleep apnea (adult) (pediatric): Secondary | ICD-10-CM

## 2021-04-25 DIAGNOSIS — I152 Hypertension secondary to endocrine disorders: Secondary | ICD-10-CM

## 2021-04-25 LAB — CBC WITH DIFFERENTIAL/PLATELET
Basophils Absolute: 0 10*3/uL (ref 0.0–0.1)
Basophils Relative: 0.5 % (ref 0.0–3.0)
Eosinophils Absolute: 0.2 10*3/uL (ref 0.0–0.7)
Eosinophils Relative: 2.4 % (ref 0.0–5.0)
HCT: 38.2 % — ABNORMAL LOW (ref 39.0–52.0)
Hemoglobin: 12 g/dL — ABNORMAL LOW (ref 13.0–17.0)
Lymphocytes Relative: 37.2 % (ref 12.0–46.0)
Lymphs Abs: 2.5 10*3/uL (ref 0.7–4.0)
MCHC: 31.4 g/dL (ref 30.0–36.0)
MCV: 75.7 fl — ABNORMAL LOW (ref 78.0–100.0)
Monocytes Absolute: 0.7 10*3/uL (ref 0.1–1.0)
Monocytes Relative: 10.1 % (ref 3.0–12.0)
Neutro Abs: 3.4 10*3/uL (ref 1.4–7.7)
Neutrophils Relative %: 49.8 % (ref 43.0–77.0)
Platelets: 322 10*3/uL (ref 150.0–400.0)
RBC: 5.05 Mil/uL (ref 4.22–5.81)
RDW: 14 % (ref 11.5–15.5)
WBC: 6.8 10*3/uL (ref 4.0–10.5)

## 2021-04-25 LAB — COMPREHENSIVE METABOLIC PANEL
ALT: 48 U/L (ref 0–53)
AST: 30 U/L (ref 0–37)
Albumin: 4.1 g/dL (ref 3.5–5.2)
Alkaline Phosphatase: 88 U/L (ref 39–117)
BUN: 17 mg/dL (ref 6–23)
CO2: 33 mEq/L — ABNORMAL HIGH (ref 19–32)
Calcium: 10 mg/dL (ref 8.4–10.5)
Chloride: 102 mEq/L (ref 96–112)
Creatinine, Ser: 1.17 mg/dL (ref 0.40–1.50)
GFR: 79.43 mL/min (ref >60.00)
Glucose, Bld: 188 mg/dL — ABNORMAL HIGH (ref 70–99)
Potassium: 4.3 mEq/L (ref 3.5–5.1)
Sodium: 142 mEq/L (ref 135–145)
Total Bilirubin: 0.4 mg/dL (ref 0.2–1.2)
Total Protein: 7.4 g/dL (ref 6.0–8.3)

## 2021-04-25 LAB — THYROID PANEL WITH TSH
Free Thyroxine Index: 2.8 (ref 1.4–3.8)
T3 Uptake: 29 % (ref 22–35)
T4, Total: 9.7 ug/dL (ref 4.9–10.5)
TSH: 1.76 mIU/L (ref 0.40–4.50)

## 2021-04-25 LAB — SEDIMENTATION RATE: Sed Rate: 84 mm/hr — ABNORMAL HIGH (ref 0–15)

## 2021-04-25 NOTE — Progress Notes (Signed)
Aaron Woods 36 y.o.   Chief Complaint  Patient presents with   Tachycardia    Pt had concerns about resting heart rate being higher than ]norma    HISTORY OF PRESENT ILLNESS: This is a 36 y.o. male complaining of resting tachycardia for 1 week.  Heart rate was anything between 95 and 105.  Last time was 2 days ago.  No chest pain or difficulty breathing.  Diabetic and hypertensive.  Compliant with medications. Medication list reviewed with patient. Has been drinking more diet sodas than usual.  Drinks herbal tea in the morning.  Non-smoker. Recently had what felt like a viral illness which lasted several days.  Now better. No other complaints or medical concerns today.  HPI   Prior to Admission medications   Medication Sig Start Date End Date Taking? Authorizing Provider  acetaminophen (TYLENOL) 325 MG tablet Take 650 mg by mouth every 6 (six) hours as needed for mild pain or headache.   Yes [provider]  blood glucose meter kit and supplies by Other route as directed. Dispense based on patient and insurance preference. Use up to four times daily as directed. (FOR ICD-10 E10.9, E11.9).   Yes [provider]  empagliflozin (JARDIANCE) 25 MG TABS tablet Take 1 tablet (25 mg total) by mouth daily before breakfast. 02/28/21  Yes Shamleffer, Melanie Crazier, MD  glucose blood (ONETOUCH VERIO) test strip 1 each by Other route daily in the afternoon. Use as instructed 02/28/21  Yes Shamleffer, Melanie Crazier, MD  insulin degludec (TRESIBA FLEXTOUCH) 200 UNIT/ML FlexTouch Pen Inject 40 Units into the skin daily in the afternoon. 02/28/21  Yes Shamleffer, Melanie Crazier, MD  Insulin Pen Needle (BD PEN NEEDLE MICRO U/F) 32G X 6 MM MISC 1 Device by Does not apply route daily in the afternoon. 02/28/21  Yes Shamleffer, Melanie Crazier, MD  liraglutide (VICTOZA) 18 MG/3ML SOPN Inject 1.8 mg into the skin daily in the afternoon. 04/17/21  Yes Shamleffer, Melanie Crazier, MD   rosuvastatin (CRESTOR) 20 MG tablet Take 1 tablet (20 mg total) by mouth daily. 10/12/20  Yes Biagio Borg, MD  telmisartan-hydrochlorothiazide (MICARDIS HCT) 80-12.5 MG tablet Take 1 tablet by mouth daily. 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  Social History   Socioeconomic History   Marital status: Married    Spouse name: [name]   Number of children: 3   Years of education: Not on file   Highest education level: High school graduate  Occupational History   Not on file  Tobacco Use   Smoking  status: Never   Smokeless tobacco: Never  Substance and Sexual Activity   Alcohol use: No   Drug use: No   Sexual activity: Not on file  Other Topics Concern   Not on file  Social History Narrative   Live at home with wife and 3 kids   Right handed   Caffeine: 1 cup of tea in the AM   Social Determinants of Health   Financial Resource Strain: Not on file  Food Insecurity: Not on file  Transportation Needs: Not on file  Physical Activity: Not on  file  Stress: Not on file  Social Connections: Not on file  Intimate Partner Violence: Not on file    Family History  Problem Relation Age of Onset   Diabetes Mother    Diabetes Brother    Diabetes Brother    Diabetes Brother    Diabetes Maternal Grandmother      Review of Systems  Constitutional: Negative.  Negative for chills and fever.  HENT: Negative.  Negative for congestion and sore throat.   Respiratory: Negative.  Negative for cough and shortness of breath.   Cardiovascular:  Negative for chest pain and leg swelling.  Gastrointestinal:  Negative for abdominal pain, diarrhea, nausea and vomiting.  Genitourinary: Negative.  Negative for dysuria and hematuria.  Skin: Negative.  Negative for rash.  Neurological:  Negative for dizziness and headaches.  All other systems reviewed and are negative.  Today's Vitals   04/25/21 0849  BP: 128/82  Pulse: 80  Temp: 98.1 F (36.7 C)  TempSrc: Oral  SpO2: 97%  Weight: 242 lb (109.8 kg)  Height: 5' 9"  (1.753 m)   Body mass index is 35.74 kg/m.  Physical Exam Constitutional:      Appearance: Normal appearance.  HENT:     Head: Normocephalic.     Mouth/Throat:     Mouth: Mucous membranes are moist.     Pharynx: Oropharynx is clear.  Eyes:     Extraocular Movements: Extraocular movements intact.     Conjunctiva/sclera: Conjunctivae normal.     Pupils: Pupils are equal, round, and reactive to light.  Cardiovascular:     Rate and Rhythm: Normal rate and regular rhythm.     Pulses: Normal pulses.     Heart sounds: Normal heart sounds.  Pulmonary:     Effort: Pulmonary effort is normal.     Breath sounds: Normal breath sounds.  Abdominal:     General: There is no distension.     Palpations: Abdomen is soft.     Tenderness: There is no abdominal tenderness.  Musculoskeletal:        General: Normal range of motion.     Cervical back: Normal range of motion and neck supple.  Skin:    General: Skin is warm and dry.      Capillary Refill: Capillary refill takes less than 2 seconds.  Neurological:     General: No focal deficit present.     Mental Status: He is alert and oriented to person, place, and time.  Psychiatric:        Mood and Affect: Mood normal.        Behavior: Behavior normal.   EKG: Normal sinus rhythm with ventricular rate of 75/min.  No acute ischemic changes.  ASSESSMENT & PLAN: A total of 47 minutes was spent with the patient and counseling/coordination of care regarding preparing for this visit, review of most recent office visit notes, review of all medications, review of most recent  blood work results, review EKG done today, differential diagnosis of tachycardias, education on nutrition, ED precautions, prognosis, documentation and need for follow-up.  Problem List Items Addressed This Visit       Cardiovascular and Mediastinum   Hypertension associated with diabetes ([place])    Well-controlled hypertension.  Continue Micardis HCTZ 80-12.5 mg daily. Diabetes being handled by endocrinologist.  Taking Tyler Aas insulin 40 units daily along with Jardiance and Victoza. Diet and nutrition discussed. No medication identified that could cause tachycardia.        Respiratory   Chronic intermittent hypoxia with obstructive sleep apnea    Recently started on CPAP treatment.        Endocrine   Dyslipidemia associated with type 2 diabetes mellitus ([place])    Stable.  Continue rosuvastatin 20 mg daily.       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 Differential diagnosis discussed with patient. Multiple triggers identified. Tachycardia most likely related to increased diet soda intake over the past couple weeks.  Also drinking caffeinated herbal tea. Has also increased stress due to  class workload.      Relevant Orders   CBC with Differential/Platelet   Comprehensive metabolic panel   Sedimentation rate   Thyroid Panel With TSH   EKG 12-Lead     Patient Instructions  Hypertension, Adult High blood pressure (hypertension) is when the force of blood pumping through the arteries is too strong. The arteries are the blood vessels that carry blood from the heart throughout the body. Hypertension forces the heart to work harder to pump blood and may cause arteries to become narrow or stiff. Untreated or uncontrolled hypertension can cause a heart attack, heart failure, a stroke, kidney disease, and other problems. A blood pressure reading consists of a higher number over a lower number. Ideally, your blood pressure should be below 120/80. The first ("top") number is called the systolic pressure. It is a measure of the pressure in your arteries as your heart beats. The second ("bottom") number is called the diastolic pressure. It is a measure of the pressure in your arteries as the heart relaxes. What are the causes? The exact cause of this condition is not known. There are some conditions that result in or are related to high blood pressure. What increases the risk? Some risk factors for high blood pressure are under your control. The following factors may make you more likely to develop this condition: Smoking. Having type 2 diabetes mellitus, high cholesterol, or both. Not getting enough exercise or physical activity. Being overweight. Having too much fat, sugar, calories, or salt (sodium) in your diet. Drinking too much alcohol. Some risk factors for high blood pressure may be difficult or impossible to change. Some of these factors include: Having chronic kidney disease. Having a family history of high blood pressure. Age. Risk increases with age. Race. You may be at higher risk if you are African American. Gender. Men are at higher risk than women before age 58.  After age 66, women are at higher risk than men. Having obstructive sleep apnea. Stress. What are the signs or symptoms? High blood pressure may not cause symptoms. Very high blood pressure (hypertensive crisis) may cause: Headache. Anxiety. Shortness of breath. Nosebleed. Nausea and vomiting. Vision changes. Severe  chest pain. Seizures. How is this diagnosed? This condition is diagnosed by measuring your blood pressure while you are seated, with your arm resting on a flat surface, your legs uncrossed, and your feet flat on the floor. The cuff of the blood pressure monitor will be placed directly against the skin of your upper arm at the level of your heart. It should be measured at least twice using the same arm. Certain conditions can cause a difference in blood pressure between your right and left arms. Certain factors can cause blood pressure readings to be lower or higher than normal for a short period of time: When your blood pressure is higher when you are in a health care provider's office than when you are at home, this is called white coat hypertension. Most people with this condition do not need medicines. When your blood pressure is higher at home than when you are in a health care provider's office, this is called masked hypertension. Most people with this condition may need medicines to control blood pressure. If you have a high blood pressure reading during one visit or you have normal blood pressure with other risk factors, you may be asked to: Return on a different day to have your blood pressure checked again. Monitor your blood pressure at home for 1 week or longer. If you are diagnosed with hypertension, you may have other blood or imaging tests to help your health care provider understand your overall risk for other conditions. How is this treated? This condition is treated by making healthy lifestyle changes, such as eating healthy foods, exercising more, and reducing  your alcohol intake. Your health care provider may prescribe medicine if lifestyle changes are not enough to get your blood pressure under control, and if: Your systolic blood pressure is above 130. Your diastolic blood pressure is above 80. Your personal target blood pressure may vary depending on your medical conditions, your age, and other factors. Follow these instructions at home: Eating and drinking  Eat a diet that is high in fiber and potassium, and low in sodium, added sugar, and fat. An example eating plan is called the DASH (Dietary Approaches to Stop Hypertension) diet. To eat this way: Eat plenty of fresh fruits and vegetables. Try to fill one half of your plate at each meal with fruits and vegetables. Eat whole grains, such as whole-wheat pasta, brown rice, or whole-grain bread. Fill about one fourth of your plate with whole grains. Eat or drink low-fat dairy products, such as skim milk or low-fat yogurt. Avoid fatty cuts of meat, processed or cured meats, and poultry with skin. Fill about one fourth of your plate with lean proteins, such as fish, chicken without skin, beans, eggs, or tofu. Avoid pre-made and processed foods. These tend to be higher in sodium, added sugar, and fat. Reduce your daily sodium intake. Most people with hypertension should eat less than 1,500 mg of sodium a day. Do not drink alcohol if: Your health care provider tells you not to drink. You are pregnant, may be pregnant, or are planning to become pregnant. If you drink alcohol: Limit how much you use to: 0-1 drink a day for women. 0-2 drinks a day for men. Be aware of how much alcohol is in your drink. In the [place] one drink equals one 12 oz bottle of beer (355 mL), one 5 oz glass of wine (148 mL), or one 1 oz glass of hard liquor (44 mL). Lifestyle  Work with your health  care provider to maintain a healthy body weight or to lose weight. Ask what an ideal weight is for you. Get at least 30 minutes of  exercise most days of the week. Activities may include walking, swimming, or biking. Include exercise to strengthen your muscles (resistance exercise), such as Pilates or lifting weights, as part of your weekly exercise routine. Try to do these types of exercises for 30 minutes at least 3 days a week. Do not use any products that contain nicotine or tobacco, such as cigarettes, e-cigarettes, and chewing tobacco. If you need help quitting, ask your health care provider. Monitor your blood pressure at home as told by your health care provider. Keep all follow-up visits as told by your health care provider. This is important. Medicines Take over-the-counter and prescription medicines only as told by your health care provider. Follow directions carefully. Blood pressure medicines must be taken as prescribed. Do not skip doses of blood pressure medicine. Doing this puts you at risk for problems and can make the medicine less effective. Ask your health care provider about side effects or reactions to medicines that you should watch for. Contact a health care provider if you: Think you are having a reaction to a medicine you are taking. Have headaches that keep coming back (recurring). Feel dizzy. Have swelling in your ankles. Have trouble with your vision. Get help right away if you: Develop a severe headache or confusion. Have unusual weakness or numbness. Feel faint. Have severe pain in your chest or abdomen. Vomit repeatedly. Have trouble breathing. Summary Hypertension is when the force of blood pumping through your arteries is too strong. If this condition is not controlled, it may put you at risk for serious complications. Your personal target blood pressure may vary depending on your medical conditions, your age, and other factors. For most people, a normal blood pressure is less than 120/80. Hypertension is treated with lifestyle changes, medicines, or a combination of both. Lifestyle  changes include losing weight, eating a healthy, low-sodium diet, exercising more, and limiting alcohol. This information is not intended to replace advice given to you by your health care provider. Make sure you discuss any questions you have with your health care provider. Document Revised: 11/18/2017 Document Reviewed: 11/18/2017 Elsevier Patient Education  2022 [place], [place] [facility]

## 2021-04-25 NOTE — Assessment & Plan Note (Signed)
Stable.  Continue rosuvastatin 20 mg daily. ?

## 2021-04-25 NOTE — Assessment & Plan Note (Signed)
Recently started on CPAP treatment.

## 2021-04-25 NOTE — Patient Instructions (Signed)

## 2021-04-25 NOTE — Assessment & Plan Note (Addendum)
Well-controlled hypertension.  Continue Micardis HCTZ 80-12.5 mg daily. Diabetes being handled by endocrinologist.  Taking Evaristo Bury insulin 40 units daily along with Jardiance and Victoza. Diet and nutrition discussed. No medication identified that could cause tachycardia.

## 2021-04-25 NOTE — Assessment & Plan Note (Signed)
No tachycardia at present time or for the past 48 hours. Differential diagnosis discussed with patient. Multiple triggers identified. Tachycardia most likely related to increased diet soda intake over the past couple weeks.  Also drinking caffeinated herbal tea. Has also increased stress due to class workload.

## 2021-05-27 ENCOUNTER — Ambulatory Visit: Payer: 59 | Admitting: Emergency Medicine

## 2021-07-03 ENCOUNTER — Ambulatory Visit: Payer: 59 | Admitting: Neurology

## 2021-07-03 ENCOUNTER — Encounter: Payer: Self-pay | Admitting: Neurology

## 2021-07-03 VITALS — BP 121/83 | HR 91 | Ht 69.0 in | Wt 247.0 lb

## 2021-07-03 DIAGNOSIS — G4710 Hypersomnia, unspecified: Secondary | ICD-10-CM | POA: Diagnosis not present

## 2021-07-03 DIAGNOSIS — G4733 Obstructive sleep apnea (adult) (pediatric): Secondary | ICD-10-CM

## 2021-07-03 DIAGNOSIS — G4730 Sleep apnea, unspecified: Secondary | ICD-10-CM | POA: Diagnosis not present

## 2021-07-03 DIAGNOSIS — R351 Nocturia: Secondary | ICD-10-CM

## 2021-07-03 NOTE — Patient Instructions (Addendum)
1 )OSA severe, no data in regards to snoring or positional AHI. Lots of motion artefact. Moderate-Severe hypoxia as well. CPAP at  95% pressure of 10 cm water has reduced the AHI by 90%.  ? ?2) former excessive daytime sleepiness reflected in an Epworth sleepiness score of 15 points,  now down to 10 points on CPAP.  ?excessive daytime fatigue reflected in a fatigue severity score of 43 points was now 44 points. ( Higher !) .   ? ?3)  He was set up with a FFM,  Airfit F 20 medium. Feels too tight or leaks. Large model did leak.  ?He has patent nasal passages. Can try a nasal mask and use a chin strap.   ?Nocturia - 3-5 times at night - he takes his diuretic at night- lets change that to AM . ?Overall he estimates to get only 4 maybe 5 hours of sleep many nights. ? ? ? My Plan is to proceed with: ? ?1) continue CPAP, get fitted for a nasal pillow and chinstrap. This may not reduce your snoring as effectivly but may give you better comfort.  ?2) Take HCTZ in AM not at bedtime- it should reduce your nocturia.  ? ?

## 2021-07-03 NOTE — Progress Notes (Signed)
CM sent to [facility] for new order ?

## 2021-07-03 NOTE — Progress Notes (Signed)
? ? ?SLEEP MEDICINE CLINIC ?  ? ?Provider:  Larey Seat, MD  ?Primary Care Physician:  Horald Pollen, MD ?[address] ?[place] [address]  ? ?  ?Referring Provider: Horald Pollen, Md ?[address] ?[place],  [place] [address]  ?  ?  ?    ?Chief Complaint according to patient   ?Patient presents with:  ?  ? New Patient (Initial Visit)  ?   First visit on CPAP - LUNA , adapt is DME.   ?  ?  ?HISTORY OF PRESENT ILLNESS:  ?Aaron Woods is a 36 y.o. patient who is seen In his first RV on 07/03/2021: ? ?Mr. Achord is home sleep test was performed on 19 March 2021 based on witnessed snoring and apnea per spouse.  Epworth sleepiness score had been 15 points which is high fatigue severity score was 43 points.  He also woke up with headaches in the morning.  The patient slept 6 hours and 21 minutes per recorded time he did not enter REM sleep at least not to a percentage that a REM and non-REM sleep apnea could be differentiated.  His REM his AHI was 39.7/h so this is about 40 apneas and shallow breathing spells called hypopnea apneas per hour of sleep and considered severe.  We did not have data from the chest wall electrode that seems not to have recorded snoring or sleep position.  The patient had hypoxia for about 42 minutes.  This excludes the patient from using a dental device or inspire device and I ordered an auto titration CPAP for him with a setting from 6 to 18 cm water with 3 cm EPR, heated humidification and a mask of patient's choice. ? ?The patient had several bathroom breaks during the home sleep test which may have also led to the assumption that his hypoxia is actually motion artifact.  In his download for his CPAP machine shows that he uses the machine on average 3 hours and 32 minutes.  So currently he is not considered compliant because only 8 out of 30 days that he use the machine over 4 hours which is the minimum.  The settings are as I ordered.  His mean pressure or  average pressure is nice is 10.3 cmH2O and average air leak is 18.4 L/min his residual apnea-hypopnea index is 3.4/h this is a good resolution.  I think the air leakage is bothering him and it is harder to go to the bathroom and back and having to readjust the interface each time.  He was set up with a FFM,  Airfit F 20 medium. Feels too tight or leaks.  ?He has patent nasal passages. Can try a nasal mask and use a chin strap.  ? ? ? ? ?Chief concern according to patient :  Internal referral for suspected sleep apnea. Pt reports loud snoring since 2012. Has trouble getting sleep. Pts wife has witnessed him to stop breathing at night. Daytime sleepiness and fatigue all day.  ?  ?I have the pleasure of seeing Aaron Woods on 02-27-2021, a right -handed  African American male with a possible sleep disorder. He  has a past medical history of Borderline high blood pressure, Diabetes 1.5, Dx in 2006,  managed as type 2 ([place]), and Diabetes mellitus. Had COVID pneumonia , hospitalized 9/  2021. Was unvaccinated at the time. He has a hip injury , and has been followed bu endocrinologist Dr Kelton Pillar.  ?  ?  ?  Sleep relevant medical history:  obesity- weight gain over the last 18 months.   ? Family medical /sleep history: no other family member on CPAP with OSA, insomnia, sleep walkers.  ?  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  ? and lives in a household with spouse and 3 children, no pats.  ? ?Tobacco use: none .  ETOH use :none ,  ?Caffeine intake in form of Coffee( /) Soda( 1 a week) Tea ( in AM ) or energy drinks. ?Regular exercise in form of GYm and walking.   ? ?  ?  ?Sleep habits are as follows: The patient's dinner time is between 6.30 PM. The patient goes to bed at 10 PM and continues to sleep for a restless 4-5 hours hours, sometime wakes for  bathroom breaks. ?Bedroom is cool, quiet , and dark.  ?The preferred sleep position is prone , with the support of  1 pillow. Dreams are reportedly rare.  ?5  AM is the usual rise time.  ?The patient wakes up with an alarm, he is very hard to get up. Marland Kitchen  ?He reports not feeling refreshed or restored in AM, with symptoms such as dry mouth, morning headaches, and residual fatigue.  ?Naps are taken about once a week,on weekends.Naps lasting from 1-2 hours and are less refreshing than nocturnal sleep.  ?  ?Review of Systems: ?Out of a complete 14 system review, the patient complains of only the following symptoms, and all other reviewed systems are negative.:  ?Fatigue, sleepiness , snoring, fragmented sleep, Insomnia - trouble to sleep through. Between 4-5 AM ?  ?How likely are you to doze in the following situations: ?0 = not likely, 1 = slight chance, 2 = moderate chance, 3 = high chance ?  ?Sitting and Reading? ?Watching Television? ?Sitting inactive in a public place (theater or meeting)? ?As a passenger in a car for an hour without a break? ?Lying down in the afternoon when circumstances permit? ?Sitting and talking to someone? ?Sitting quietly after lunch without alcohol? ?In a car, while stopped for a few minutes in traffic? ?  ?Total = 15/ 24 points  ? FSS endorsed at 43/ 63 points.  ? ?Social History  ? ?Socioeconomic History  ? Marital status: Married  ?  Spouse name: Mosetta Putt  ? Number of children: 3  ? Years of education: Not on file  ? Highest education level: High school graduate  ?Occupational History  ? Not on file  ?Tobacco Use  ? Smoking status: Never  ? Smokeless tobacco: Never  ?Substance and Sexual Activity  ? Alcohol use: No  ? Drug use: No  ? Sexual activity: Not on file  ?Other Topics Concern  ? Not on file  ?Social History Narrative  ? Live at home with wife and 3 kids  ? Right handed  ? Caffeine: 1 cup of tea in the AM  ? ?Social Determinants of Health  ? ?Financial Resource Strain: Not on file  ?Food Insecurity: Not on file  ?Transportation Needs: Not on file  ?Physical Activity: Not on file  ?Stress: Not on  file  ?Social Connections: Not on file  ? ? ?Family History  ?Problem Relation Age of Onset  ? Diabetes Mother   ? Diabetes Brother   ? Diabetes Brother   ? Diabetes Brother   ? Diabetes Maternal Grandmother   ? ? ?Past Medical History:  ?Diagnosis Date  ? Borderline high blood pressure   ?  Diabetes 1.5, managed as type 2 ([place])   ? Diabetes mellitus   ? ? ?History reviewed. No pertinent surgical history.  ? ?Current Outpatient Medications on File Prior to Visit  ?Medication Sig Dispense Refill  ? acetaminophen (TYLENOL) 325 MG tablet Take 650 mg by mouth every 6 (six) hours as needed for mild pain or headache.    ? blood glucose meter kit and supplies by Other route as directed. Dispense based on patient and insurance preference. Use up to four times daily as directed. (FOR ICD-10 E10.9, E11.9).    ? empagliflozin (JARDIANCE) 25 MG TABS tablet Take 1 tablet (25 mg total) by mouth daily before breakfast. 90 tablet 2  ? glucose blood (ONETOUCH VERIO) test strip 1 each by Other route daily in the afternoon. Use as instructed 100 each 3  ? insulin degludec (TRESIBA FLEXTOUCH) 200 UNIT/ML FlexTouch Pen Inject 40 Units into the skin daily in the afternoon. 15 mL 6  ? Insulin Pen Needle (BD PEN NEEDLE MICRO U/F) 32G X 6 MM MISC 1 Device by Does not apply route daily in the afternoon. 100 each 2  ? liraglutide (VICTOZA) 18 MG/3ML SOPN Inject 1.8 mg into the skin daily in the afternoon. 9 mL 11  ? rosuvastatin (CRESTOR) 20 MG tablet Take 1 tablet (20 mg total) by mouth daily. 90 tablet 3  ? telmisartan-hydrochlorothiazide (MICARDIS HCT) 80-12.5 MG tablet Take 1 tablet by mouth daily. 90 tablet 3  ? ?No current facility-administered medications on file prior to visit.  ? ? ?Allergies  ?Allergen Reactions  ? Almond (Diagnostic) Itching  ? ? ?Physical exam: ? ?Today's Vitals  ? 07/03/21 0908  ?BP: 121/83  ?Pulse: 91  ?Weight: 247 lb (112 kg)  ?Height: 5' 9"  (1.753 m)  ? ?Body mass index is 36.48 kg/m?.  ? ?Wt Readings from Last  3 Encounters:  ?07/03/21 247 lb (112 kg)  ?04/25/21 242 lb (109.8 kg)  ?02/28/21 251 lb (113.9 kg)  ?  ? ?Ht Readings from Last 3 Encounters:  ?07/03/21 5' 9"  (1.753 m)  ?04/25/21 5' 9"  (1.753 m)  ?12/08

## 2021-07-04 ENCOUNTER — Ambulatory Visit: Payer: 59 | Admitting: Internal Medicine

## 2021-07-04 NOTE — Progress Notes (Deleted)
? ?Name: Aaron Woods  ?Age/ Sex: 36 y.o., male   ?MRN/ DOB: [id number], 06/18/1985    ? ?PCP: Horald Pollen, MD   ?Reason for Endocrinology Evaluation: Type 2 Diabetes Mellitus  ?Initial Endocrine Consultative Visit: 01/04/2019  ? ? ?PATIENT IDENTIFIER: Aaron Woods is a 36 y.o. male with a past medical history of T2DM and Dyslipidemia. The patient has followed with Endocrinology clinic since 01/04/2019 for consultative assistance with management of his diabetes. ? ?DIABETIC HISTORY:  ?Aaron Woods was diagnosed with T2DM in 2006. Pt intolerant to BID dosing of Metformin. His hemoglobin A1c has ranged from 7.0 % , peaking at 13.4% in 2020. ? ?On his initial visit to our clinic his A1c was 13.4%. He was on Levemir, victoza and Metformin. We replaced Levemir with Novolog Mix. ?He stopped Metformin by 06/2019 due to intolerance issues  ? ? ?Jardiance started by PCP 02/2021 ?Stopped Humalog mix and started basal insulin 02/2021 ? ?Lives with wife , 3 kids (28, 33 and 74)  ?  ?Works in Conservator, museum/gallery  ? ?SUBJECTIVE:  ? ?During the last visit (02/28/2021): A1c 11.4 %.  We stopped Humalog Mix started basal insulin, increase Victoza and Jardiance ? ? ? ?Today (07/04/2021): Aaron Woods  is here for a follow up on diabetes management.   He checks his blood sugars 2 times daily. The patient has not had hypoglycemic episodes .  The patient did not bring his meter today. ? ?Had a neurology follow-up for OSA/2023 ?Pt admits to dietary indiscretions  ?Denies nausea, vomiting or diarrhea  ?Has polyuria and polydipsia  ? ? ?HOME DIABETES REGIMEN:  ?Tresiba 40 units daily ?Victoza 1.8 mg daily  ?Jardiance 25 mg daily  ? ? ?Statin: yes ?ACE-I/ARB: no ? ?METER DOWNLOAD SUMMARY: Did not bring  ? ? ? 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 Idolina Primer)  ?Denies:  neuropathy and CKD  ?Last eye exam: Completed 05/04/2020 ?  ?Macrovascular complications:  ?   ?Denies: CAD, PVD, CVA ?  ? ? ?HISTORY:  ?Past Medical History:  ?Past Medical History:  ?Diagnosis Date  ? Borderline high blood pressure   ? Diabetes 1.5, managed as type 2 ([place])   ? Diabetes mellitus   ? ?Past Surgical History: No past surgical history on file. ?Social History:  reports that he has never smoked. He has never used smokeless tobacco. He reports that he does not drink alcohol and does not use drugs. ?Family History:  ?Family History  ?Problem Relation Age of Onset  ? Diabetes Mother   ? Diabetes Brother   ? Diabetes Brother   ? Diabetes Brother   ? Diabetes Maternal Grandmother   ? ? ? ?HOME MEDICATIONS: ?Allergies as of 07/04/2021   ? ?   Reactions  ? Almond (diagnostic) Itching  ? ?  ? ?  ?Medication List  ?  ? ?  ? Accurate as of July 04, 2021  6:57 AM. If you have any questions, ask your nurse or doctor.  ?  ?  ? ?  ? ?acetaminophen 325 MG tablet ?Commonly known as: TYLENOL ?Take 650 mg by mouth every 6 (six) hours as needed for mild pain or headache. ?  ?BD Pen Needle Micro U/F 32G X 6 MM Misc ?Generic drug: Insulin Pen Needle ?1 Device by Does not apply route daily in the afternoon. ?  ?blood glucose meter kit and supplies ?by Other route as directed. Dispense based on patient  and insurance preference. Use up to four times daily as directed. (FOR ICD-10 E10.9, E11.9). ?  ?empagliflozin 25 MG Tabs tablet ?Commonly known as: Jardiance ?Take 1 tablet (25 mg total) by mouth daily before breakfast. ?  ?OneTouch Verio test strip ?Generic drug: glucose blood ?1 each by Other route daily in the afternoon. Use as instructed ?  ?rosuvastatin 20 MG tablet ?Commonly known as: Crestor ?Take 1 tablet (20 mg total) by mouth daily. ?  ?telmisartan-hydrochlorothiazide 80-12.5 MG tablet ?Commonly known as: MICARDIS HCT ?Take 1 tablet by mouth daily. ?  ?Tyler Aas FlexTouch 200 UNIT/ML FlexTouch Pen ?Generic drug: insulin degludec ?Inject 40 Units into the skin daily in the afternoon. ?  ?Victoza 18 MG/3ML  Sopn ?Generic drug: liraglutide ?Inject 1.8 mg into the skin daily in the afternoon. ?  ? ?  ? ? ? ?OBJECTIVE:  ? ?Vital Signs: There were no vitals taken for this visit.  ?Wt Readings from Last 3 Encounters:  ?07/03/21 247 lb (112 kg)  ?04/25/21 242 lb (109.8 kg)  ?02/28/21 251 lb (113.9 kg)  ? ? ? ?Exam: ?General: Pt appears well and is in NAD  ?Lungs: Clear with good BS bilat with no rales, rhonchi, or wheezes  ?Heart: RRR with normal S1 and S2 and no gallops; no murmurs; no rub  ?Abdomen:  soft, nontender, without masses or organomegaly palpable  ?Extremities: Trace pretibial edema.   ?Neuro: MS is good with appropriate affect, pt is alert and Ox3  ? ? ?DM foot exam: 02/28/2021 ?  ?The skin of the feet is intact without sores or ulcerations. ?The pedal pulses are 2+ on right and 2+ on left. ?The sensation is intact to a screening 5.07, 10 gram monofilament bilaterally ?   ? ? ? ?DATA REVIEWED: ? ?Lab Results  ?Component Value Date  ? HGBA1C 11.4 (A) 02/21/2021  ? HGBA1C 9.5 (H) 10/12/2020  ? HGBA1C 8.8 (H) 12/01/2019  ? ?Lab Results  ?Component Value Date  ? MICROALBUR 19.5 (H) 10/12/2020  ? [place] 98 10/12/2020  ? CREATININE 1.17 04/25/2021  ? ? ?Lab Results  ?Component Value Date  ? CHOL 165 10/12/2020  ? HDL 33.60 (L) 10/12/2020  ? [place] 98 10/12/2020  ? TRIG 167.0 (H) 10/12/2020  ? CHOLHDL 5 10/12/2020  ?     ? ?In-office BG was 186 mg/dL  ? ? ?ASSESSMENT / PLAN / RECOMMENDATIONS:  ? ?1) ) Type 2 Diabetes Mellitus, Poorly controlled, With retinopathic complications - Most recent A1c of 11.4 %. Goal A1c < 7.0 %.   ? ? ?-Poorly controlled diabetes due to dietary indiscretions  ?- We discussed microvascular complication of uncontrolled DM to include blindeness ESRD , neuropathy and increased risk of amputations  ?- He is tolerating Jardiance, will increase on the next pick up  ?- Will increase Victoza  ?- Will switch insulin mix to Basal insulin as below  ?- He is intolerant to metformin  ? ? ?MEDICATIONS: ?-  STOP Humalog  Mix  ?- Start Tresiba 40 units once daily  ?- Increase  Victoza 1.8 mg daily  ?- Increase Jardiance 25 mg daily  ? ? ?EDUCATION / INSTRUCTIONS: ?BG monitoring instructions: Patient is instructed to check his blood sugars 2 times a day, before breakfast and supper. ?Call [facility] if: BG persistently < 70  ?I reviewed the Rule of 15 for the treatment of hypoglycemia in detail with the patient. Literature supplied. ? ? ? ?2) Diabetic complications:  ?Eye: Does have known diabetic retinopathy.  Patient urged to contact his ophthalmologist for a follow-up as he has not been seen in 10 months. ?Neuro/ Feet: Does not have known diabetic peripheral neuropathy .  ?Renal: Patient does not have known baseline CKD. He   is not on an ACEI/ARB at present.  ? ? ? ? ?F/U in 4 months  ? ? ?Signed electronically by: ?Abby Nena Jordan, MD ? ?[facility]  ?[facility] ?[address] ?[place], [place] [address] ?Phone: [phone] ?FAX: [phone] ? ? ?CC: ?Horald Pollen, MD ?[address] ?[place] [address] ?Phone: [phone]  ?Fax: [phone] ? ?Return to Endocrinology clinic as below: ?Future Appointments  ?Date Time Provider [place]  ?07/04/2021  7:30 AM Shamleffer, Melanie Crazier, MD [facility] None  ?07/12/2021  8:30 AM Leandrew Koyanagi, MD [facility] None  ?07/07/2022  9:30 AM Dohmeier, Asencion Partridge, MD [facility] None  ?  ? ? ?

## 2021-07-12 ENCOUNTER — Encounter: Payer: Self-pay | Admitting: Orthopaedic Surgery

## 2021-07-12 ENCOUNTER — Ambulatory Visit: Payer: 59 | Admitting: Orthopaedic Surgery

## 2021-07-12 ENCOUNTER — Other Ambulatory Visit: Payer: Self-pay

## 2021-07-12 VITALS — Ht 69.0 in | Wt 235.0 lb

## 2021-07-12 DIAGNOSIS — M25551 Pain in right hip: Secondary | ICD-10-CM

## 2021-07-12 NOTE — Progress Notes (Signed)
? ?  Office Visit Note ?  ?Patient: Aaron Woods           ?Date of Birth: Apr 22, 1985           ?MRN: [id number] ?Visit Date: 07/12/2021 ?             ?Requested by: Georgina Quint, MD ?[address] ?[place],  [place] [address] ?PCP: Georgina Quint, MD ? ? ?Assessment & Plan: ?Visit Diagnoses:  ?1. Pain in right hip   ? ? ?Plan: Impression is recurrent right hip pain concerning for labral pathology.  At this point, I would like to order an MR arthrogram right hip.  He will follow up with [place] once completed.   ? ?Follow-Up Instructions: Return for after MR arthrogram.  ? ?Orders:  ?No orders of the defined types were placed in this encounter. ? ?No orders of the defined types were placed in this encounter. ? ? ? ? Procedures: ?No procedures performed ? ? ?Clinical Data: ?No additional findings. ? ? ?Subjective: ?Chief Complaint  ?Patient presents with  ? Right Hip - Pain  ? ? ?HPI patient is a pleasant 36 year old male who comes in with recurrent right hip pain.  He was seen by [place] in December where it was thought he had a labral tear.  He was referred to Dr. Alvester Morin for right hip injection which significantly helped for about 2-3 months.  His pain has returned.  Located to the groin.  He describes sharp pain and catching with twisting motions.   ? ?Review of Systems as detailed in HPI.  All others reviewed and are negative.   ? ? ?Objective: ?Vital Signs: Ht 5\' 9"  (1.753 m)   Wt 235 lb (106.6 kg)   BMI 34.70 kg/m?  ? ?Physical Exam well developed and well nourished male in no acute distress.  Alert and oriented x 3.   ? ?Ortho Exam unchanged right hip exam. ? ?Specialty Comments:  ?No specialty comments available. ? ?Imaging: ?No new imaging ? ? ?PMFS History: ?Patient Active Problem List  ? Diagnosis Date Noted  ? Tachycardia 04/25/2021  ? Chronic intermittent hypoxia with obstructive sleep apnea 04/01/2021  ? 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 Hypersomnia with sleep apnea 02/27/2021  ?  Snoring 02/27/2021  ? Excessive daytime sleepiness 02/27/2021  ? Suspected sleep apnea 11/22/2020  ? Hypertension associated with diabetes ([facility]) 10/12/2020  ? Microcytic anemia 12/01/2019  ? Obesity (BMI 30.0-34.9) 12/01/2019  ? 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 Dyslipidemia 01/04/2019  ? Dyslipidemia associated with type 2 diabetes mellitus ([facility]) 11/30/2018  ? ?Past Medical History:  ?Diagnosis Date  ? Borderline high blood pressure   ? Diabetes 1.5, managed as type 2 ([facility])   ? Diabetes mellitus   ?  ?Family History  ?Problem Relation Age of Onset  ? Diabetes Mother   ? Diabetes Brother   ? Diabetes Brother   ? Diabetes Brother   ? Diabetes Maternal Grandmother   ?  ?History reviewed. No pertinent surgical history. ?Social History  ? ?Occupational History  ? Not on file  ?Tobacco Use  ? Smoking status: Never  ? Smokeless tobacco: Never  ?Substance and Sexual Activity  ? Alcohol use: No  ? Drug use: No  ? Sexual activity: Not on file  ? ? ? ? ? ? ?

## 2021-07-16 ENCOUNTER — Telehealth: Payer: Self-pay | Admitting: Orthopaedic Surgery

## 2021-07-16 NOTE — Telephone Encounter (Signed)
Called pt to sch MR Arthrogram right hip review with Dr. Roda Shutters after 07/23/2021  ?

## 2021-07-23 ENCOUNTER — Ambulatory Visit
Admission: RE | Admit: 2021-07-23 | Discharge: 2021-07-23 | Disposition: A | Discharge status: Home / Self Care | Payer: 59 | Source: Ambulatory Visit | Attending: Orthopaedic Surgery | Admitting: Orthopaedic Surgery

## 2021-07-23 DIAGNOSIS — M25551 Pain in right hip: Secondary | ICD-10-CM

## 2021-07-23 IMAGING — XA DG FLUORO GUIDE NDL PLC/BX
2 series · 2 of 2 positions shown · non-contrast
Comparison: none

CLINICAL DATA: Right hip injection for MR arthrography

[Series 1: ortho standard · 1 of 1 slices shown (1 of 2)]
[im 1/1]
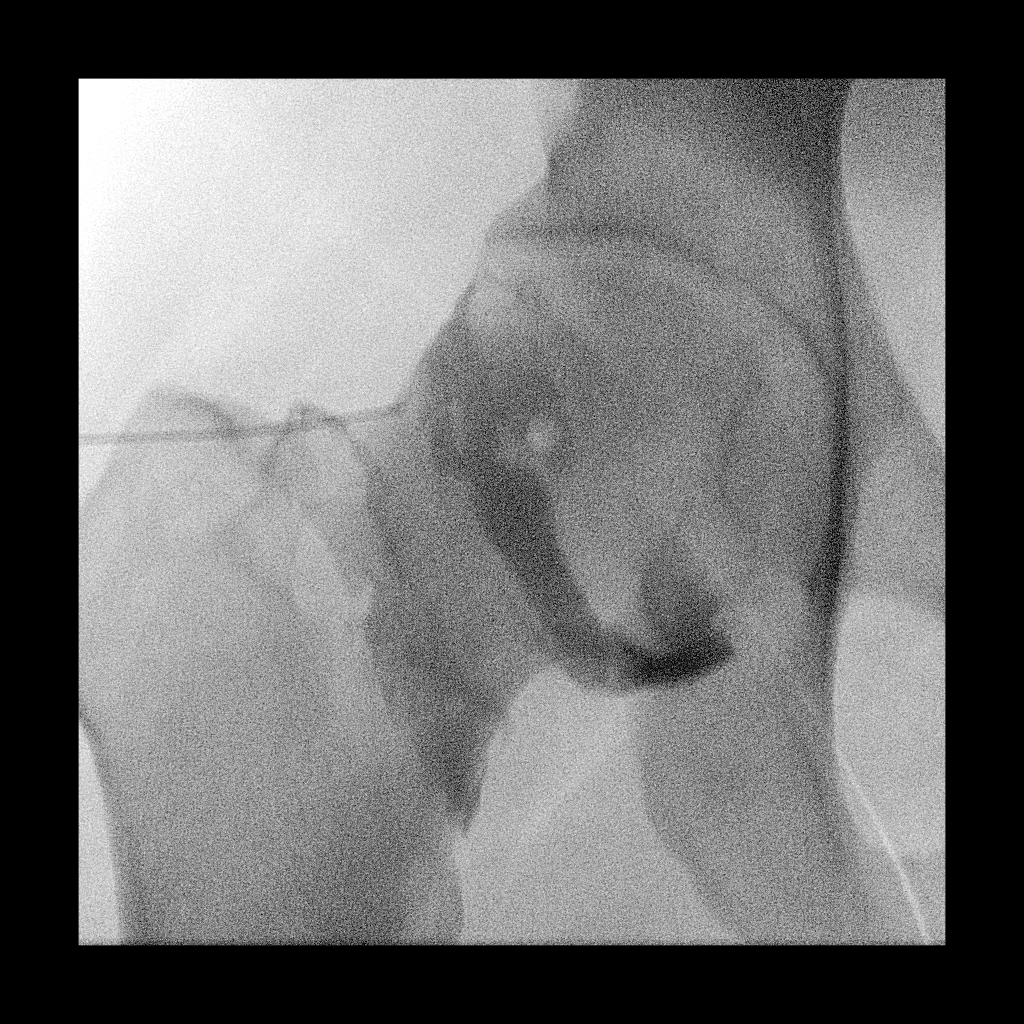

[Series 2: ortho standard · 1 of 1 slices shown (2 of 2)]
[im 1/1]
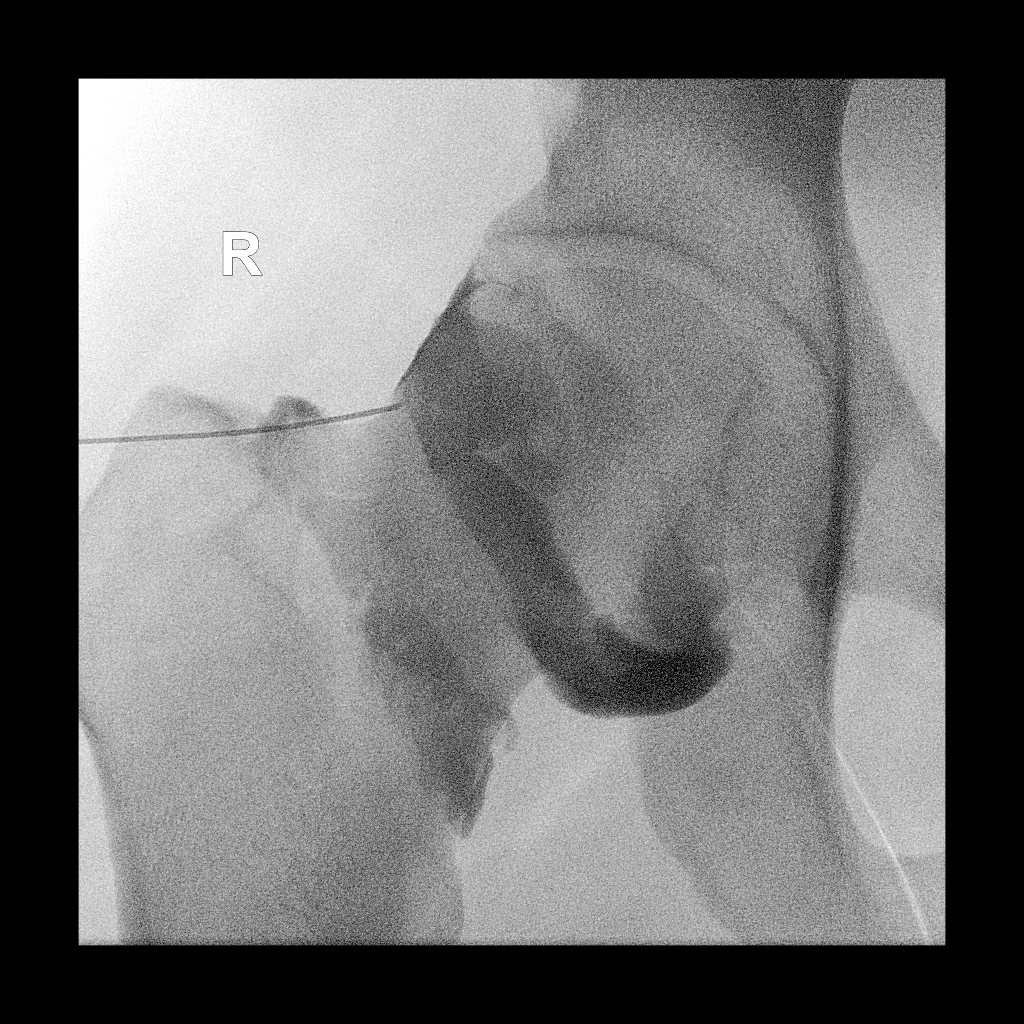

[2 of 2 positions shown; findings below may reference images not displayed]

FLUOROSCOPY:
0 minutes 24 seconds.  18.12 micro gray meter squared

PROCEDURE:
Right hip INJECTION UNDER FLUOROSCOPY

The skin overlying the hip was scrubbed with Betadine and draped in
sterile fashion. Skin and subcutaneous anesthesia was carried out
using a 25 gauge needle and 1% lidocaine. A 22 gauge spinal needle
was directed under fluoroscopic guidance on one pass into the hip
joint. 20 cc of a mixture of 0.1 cc MultiHance and dilute Isovue 200
was then used to fill the hip joint.
IMPRESSION: Technically successful right hip injection for MRI.

## 2021-07-23 IMAGING — MR MR HIP*R* W/CM
4 of 6 series · 17 of 40 positions shown · IV contrast (agent unspecified)
Comparison: None Available.

CLINICAL DATA: Right hip pain.  Sharp pain during twisting motion.

EXAM:
MRI OF THE RIGHT HIP WITH CONTRAST (MR Arthrogram)
TECHNIQUE: Multiplanar, multisequence MR imaging of the hip was performed
immediately following contrast injection into the hip joint under
fluoroscopic guidance. No intravenous contrast was administered.

[Series 4: T1 · coronal · 4.0mm · 0.53mm/px · 7 of 32 slices shown]
[im 1/32]
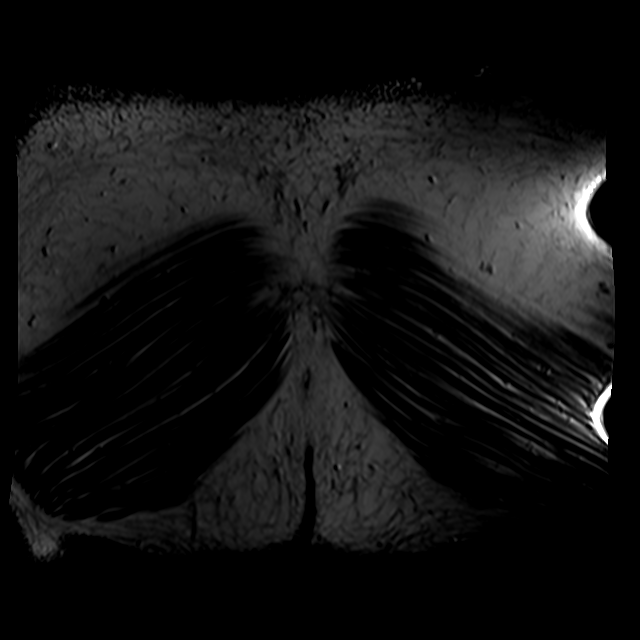
[im 6/32]
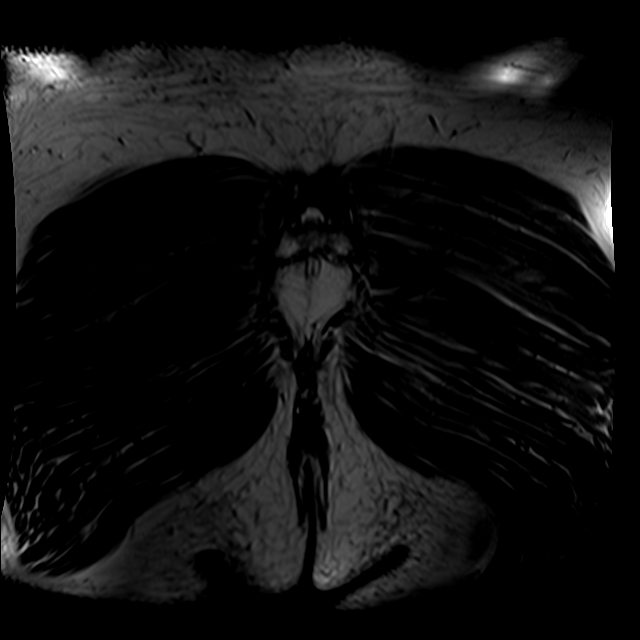
[im 11/32]
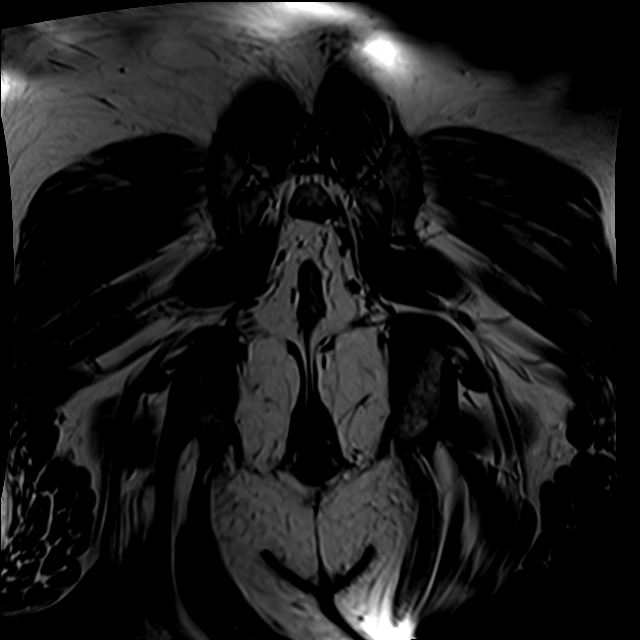
[im 16/32]
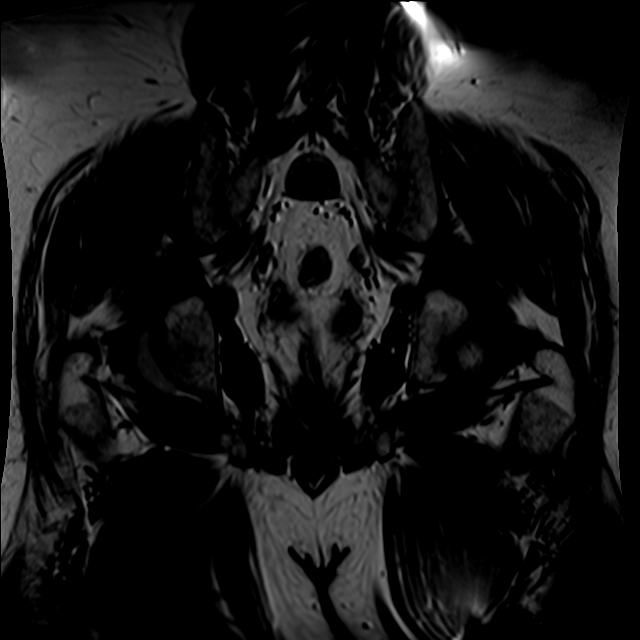
[im 21/32]
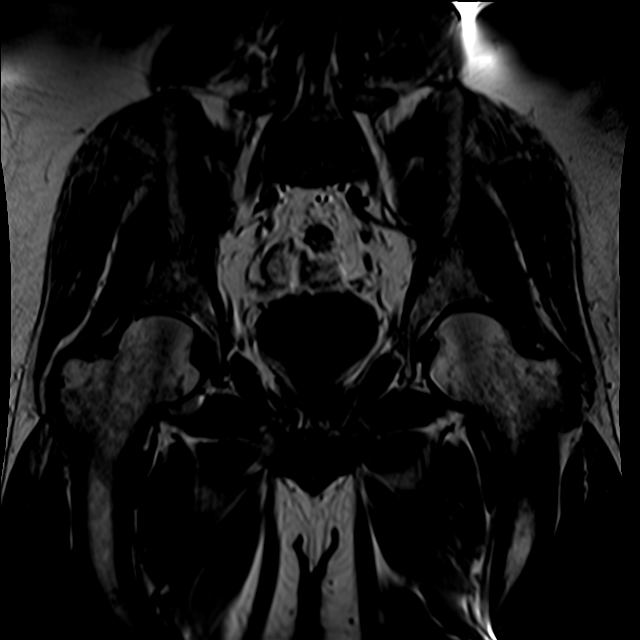
[im 26/32]
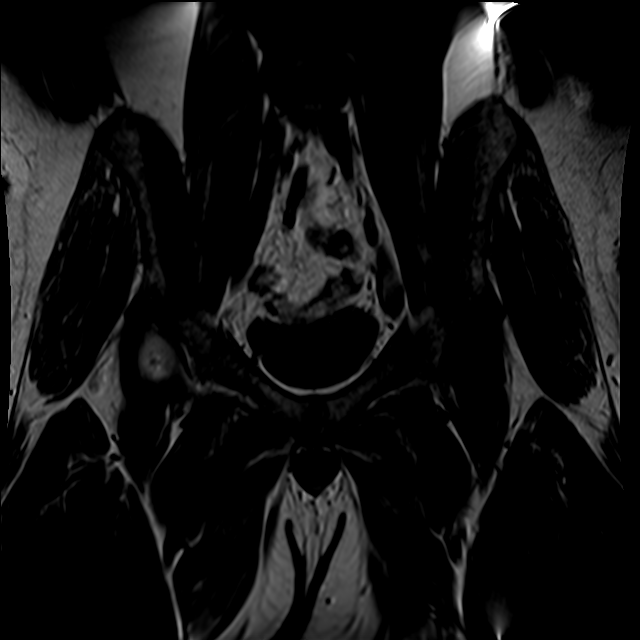
[im 32/32]
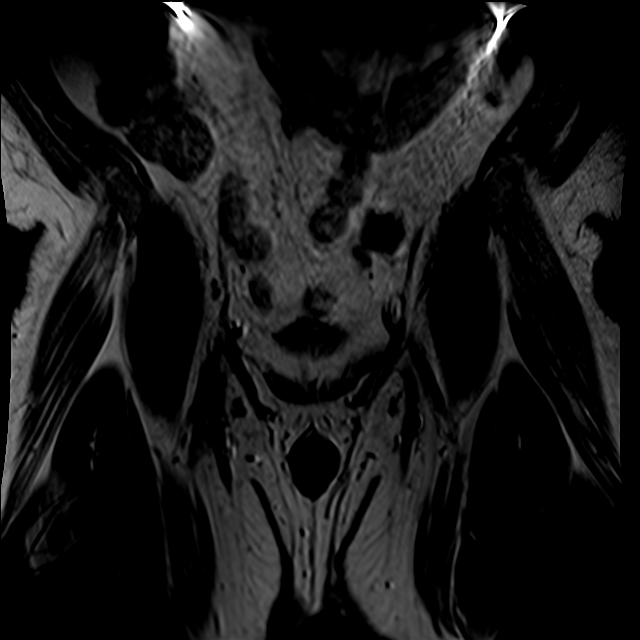

[Series 5: T2 fat-sat · coronal · 4.0mm · 0.53mm/px · 4 of 32 slices shown (1 of 2)]
[im 1/32]
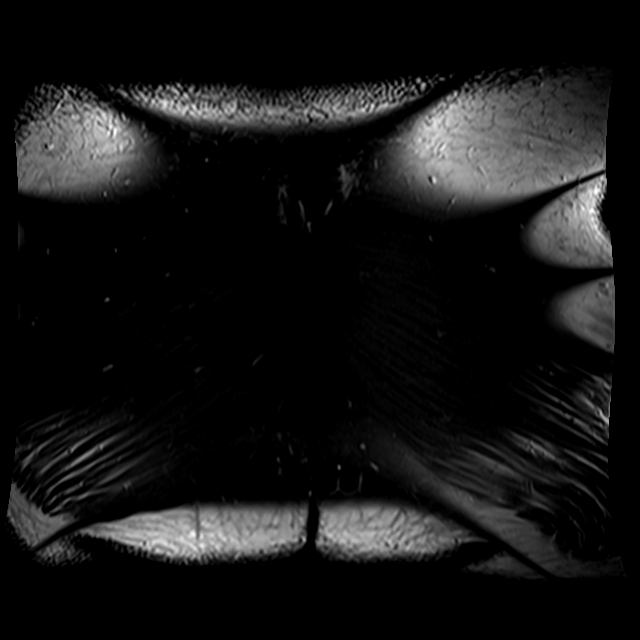
[im 5/32]
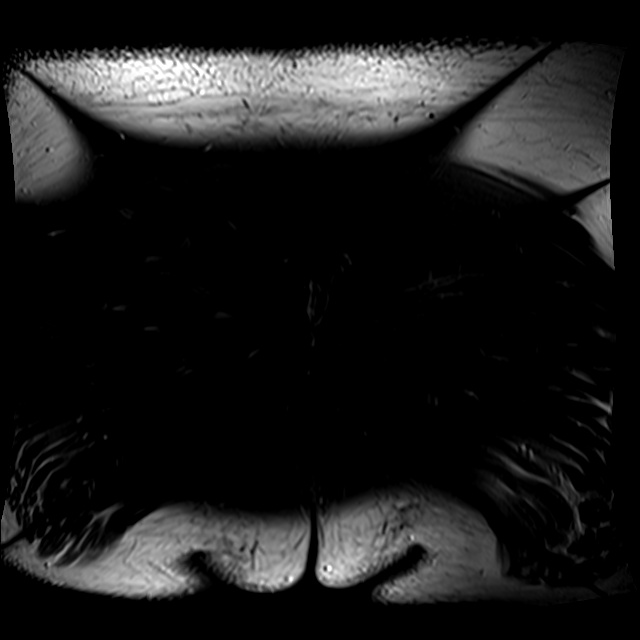
[im 18/32]
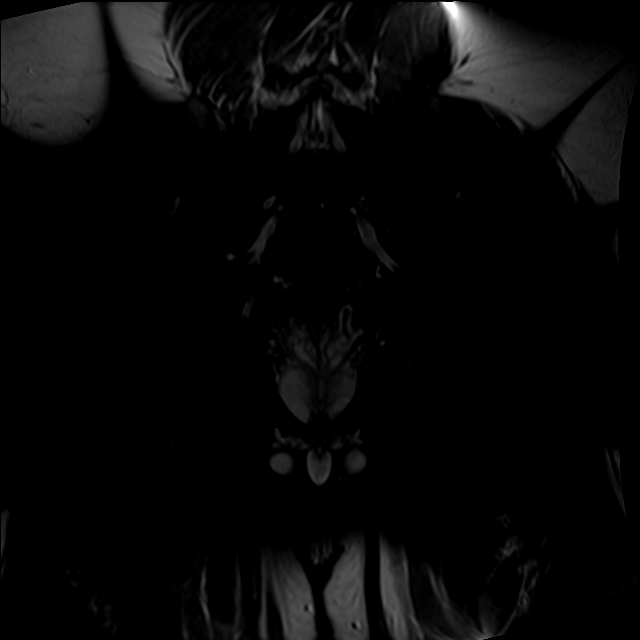
[im 27/32]
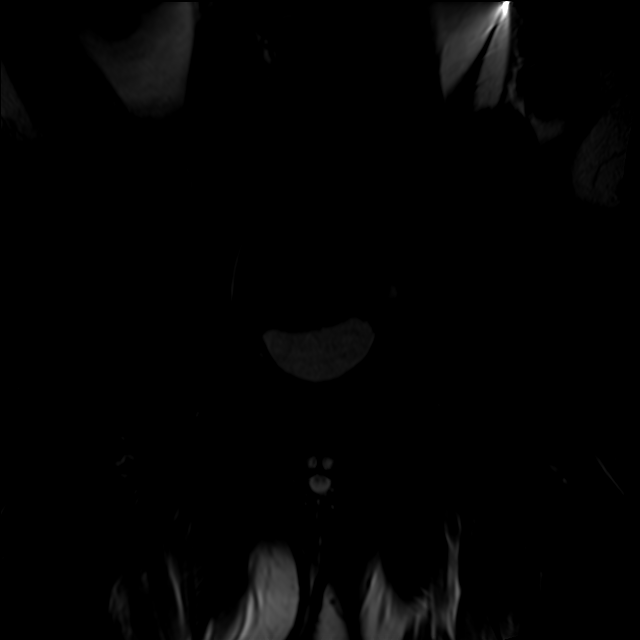

[Series 6: T2 fat-sat · axial · 4.0mm · 0.70mm/px · z∈[-49,+61]mm · 3 of 32 slices shown (2 of 2)]
[im 5/32]
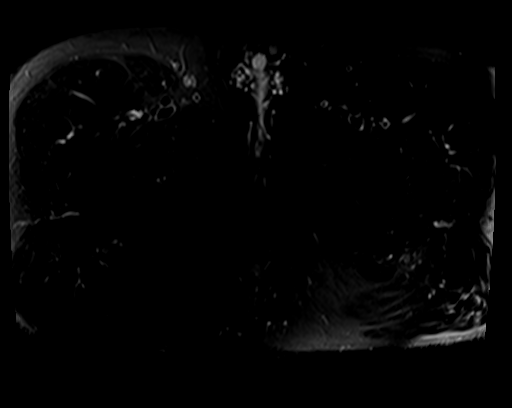
[im 18/32]
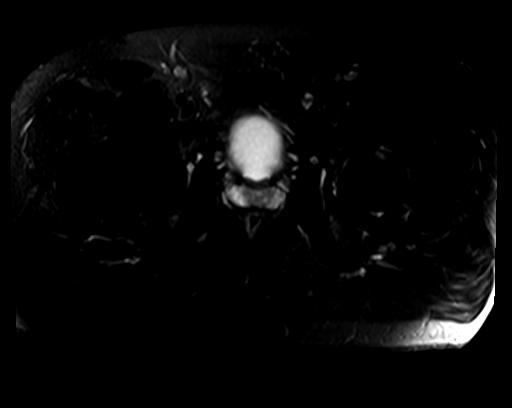
[im 27/32]
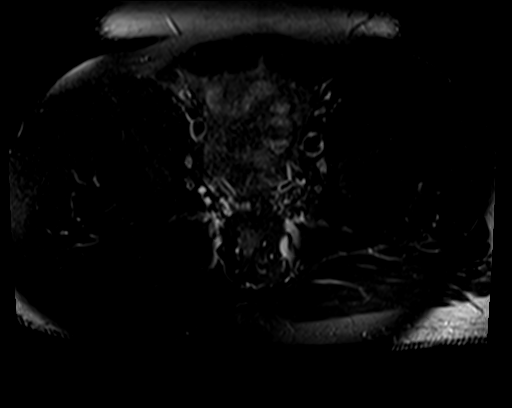

[Series 7: T1 fat-sat · axial · 4.0mm · 0.70mm/px · z∈[-82,+1]mm · 3 of 22 slices shown]
[im 1/22]
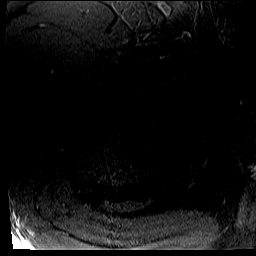
[im 11/22]
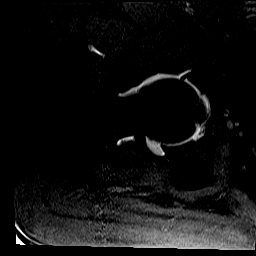
[im 22/22]
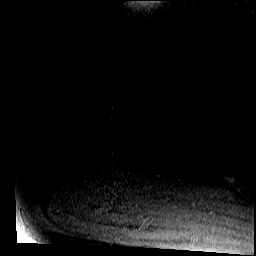

[17 of 40 positions shown; findings below may reference images not displayed]

FINDINGS: Bone

No hip fracture, dislocation or avascular necrosis. No aggressive
osseous lesion.

SI joints are normal. No SI joint widening or erosive changes.

Lower lumbar spine demonstrates no focal abnormality.

Alignment

Normal. No subluxation.

Dysplasia

None.

Joint effusion

Intraarticular contrast distends the right hip joint capsule.
Otherwise, no joint effusions.

Labrum

Small right superior anterior labral tear.

Cartilage

No focal chondral defect.

Capsule and ligaments

Normal.

Muscles and Tendons

Flexors: Normal.

Extensors: Normal.

Abductors: Normal.

Adductors: Normal.

Rotators: Normal.

Hamstrings: Normal.

Other Findings

No bursal fluid.

Viscera

No abnormality seen in pelvis. No lymphadenopathy. No free fluid in
the pelvis.
IMPRESSION: 1. Small right superior anterior labral tear.
2. No hip fracture, dislocation or avascular necrosis.

## 2021-07-23 MED ORDER — IOPAMIDOL (ISOVUE-M 200) INJECTION 41%
15.0000 mL | Freq: Once | INTRAMUSCULAR | Status: AC
  Administered 2021-07-23: 15 mL via INTRA_ARTICULAR

## 2021-08-02 ENCOUNTER — Ambulatory Visit: Payer: 59 | Admitting: Orthopaedic Surgery

## 2021-08-02 ENCOUNTER — Encounter: Payer: Self-pay | Admitting: Orthopaedic Surgery

## 2021-08-02 DIAGNOSIS — S73191A Other sprain of right hip, initial encounter: Secondary | ICD-10-CM | POA: Diagnosis not present

## 2021-08-02 NOTE — Progress Notes (Signed)
? ?  Office Visit Note ?  ?Patient: Aaron Woods           ?Date of Birth: [birth year]           ?MRN: [id number] ?Visit Date: 08/02/2021 ?             ?Requested by: Aaron Pollen, MD ?[address] ?[place],  [place] [id number] ?PCP: Aaron Pollen, MD ? ? ?Assessment & Plan: ?Visit Diagnoses:  ?1. Tear of right acetabular labrum, initial encounter   ? ? ?Plan: [name] returns today to discuss right hip MRI.  No changes in symptoms. ? ?Examination right hip is unchanged. ? ?MR arthrogram of the right hip shows a small anterior superior labral tear.  No other significant findings.  These findings were reviewed with the patient in detail and treatment options discussed to include hip arthroscopy versus conservative management and physical therapy.  He has had a cortisone injection in past which gave him relief for only 3 weeks.  Based on his options he would like to try physical therapy.  Referral has been made.  Follow-up as needed. ? ?Follow-Up Instructions: No follow-ups on file.  ? ?Orders:  ?Orders Placed This Encounter  ?Procedures  ? Ambulatory referral to Physical Therapy  ? ?No orders of the defined types were placed in this encounter. ? ? ? ? Procedures: ?No procedures performed ? ? ?Clinical Data: ?No additional findings. ? ? ?Subjective: ?Chief Complaint  ?Patient presents with  ? Right Hip - Follow-up  ? ? ?HPI ? ?Review of Systems ? ? ?Objective: ?Vital Signs: There were no vitals taken for this visit. ? ?Physical Exam ? ?Ortho Exam ? ?Specialty Comments:  ?No specialty comments available. ? ?Imaging: ?No results found. ? ? ?PMFS History: ?Patient Active Problem List  ? Diagnosis Date Noted  ? Tear of right acetabular labrum 08/02/2021  ? Tachycardia 04/25/2021  ? Chronic intermittent hypoxia with obstructive sleep apnea 04/01/2021  ? 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 Hypersomnia with sleep apnea 02/27/2021  ? Snoring 02/27/2021  ? Excessive daytime sleepiness 02/27/2021  ? Suspected  sleep apnea 11/22/2020  ? Hypertension associated with diabetes ([place]) 10/12/2020  ? Microcytic anemia 12/01/2019  ? Obesity (BMI 30.0-34.9) 12/01/2019  ? 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 Dyslipidemia 01/04/2019  ? Dyslipidemia associated with type 2 diabetes mellitus ([place]) 11/30/2018  ? ?Past Medical History:  ?Diagnosis Date  ? Borderline high blood pressure   ? Diabetes 1.5, managed as type 2 ([place])   ? Diabetes mellitus   ?  ?Family History  ?Problem Relation Age of Onset  ? Diabetes Mother   ? Diabetes Brother   ? Diabetes Brother   ? Diabetes Brother   ? Diabetes Maternal Grandmother   ?  ?History reviewed. No pertinent surgical history. ?Social History  ? ?Occupational History  ? Not on file  ?Tobacco Use  ? Smoking status: Never  ? Smokeless tobacco: Never  ?Substance and Sexual Activity  ? Alcohol use: No  ? Drug use: No  ? Sexual activity: Not on file  ? ? ? ? ? ? ?

## 2021-08-06 ENCOUNTER — Other Ambulatory Visit: Payer: Self-pay

## 2021-08-06 ENCOUNTER — Encounter: Payer: Self-pay | Admitting: Rehabilitative and Restorative Service Providers"

## 2021-08-06 ENCOUNTER — Ambulatory Visit: Payer: 59 | Admitting: Rehabilitative and Restorative Service Providers"

## 2021-08-06 DIAGNOSIS — R262 Difficulty in walking, not elsewhere classified: Secondary | ICD-10-CM

## 2021-08-06 DIAGNOSIS — M25651 Stiffness of right hip, not elsewhere classified: Secondary | ICD-10-CM

## 2021-08-06 DIAGNOSIS — M6281 Muscle weakness (generalized): Secondary | ICD-10-CM

## 2021-08-06 DIAGNOSIS — M25551 Pain in right hip: Secondary | ICD-10-CM | POA: Diagnosis not present

## 2021-08-06 NOTE — Therapy (Signed)
?OUTPATIENT PHYSICAL THERAPY EVALUATION ? ? ?Patient Name: Aaron Woods ?MRN: [id number] ?DOB:[birth year], 36 y.o., male ?Today's Date: 08/06/2021 ? ? PT End of Session - 08/06/21 0927   ? ? Visit Number 1   ? Number of Visits 20   ? Date for PT Re-Evaluation 10/15/21   ? Authorization Type [employer] $25 copay   ? Progress Note Due on Visit 10   ? PT Start Time 0930   ? PT Stop Time 1003   ? PT Time Calculation (min) 33 min   ? Activity Tolerance Patient tolerated treatment well   ? Behavior During Therapy [facility] for tasks assessed/performed   ? ?  ?  ? ?  ? ? ?Past Medical History:  ?Diagnosis Date  ? Borderline high blood pressure   ? Diabetes 1.5, managed as type 2 ([facility])   ? Diabetes mellitus   ? ?History reviewed. No pertinent surgical history. ?Patient Active Problem List  ? Diagnosis Date Noted  ? Tear of right acetabular labrum 08/02/2021  ? Tachycardia 04/25/2021  ? Chronic intermittent hypoxia with obstructive sleep apnea 04/01/2021  ? 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 Hypersomnia with sleep apnea 02/27/2021  ? Snoring 02/27/2021  ? Excessive daytime sleepiness 02/27/2021  ? Suspected sleep apnea 11/22/2020  ? Hypertension associated with diabetes ([facility]) 10/12/2020  ? Microcytic anemia 12/01/2019  ? Obesity (BMI 30.0-34.9) 12/01/2019  ? 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 Dyslipidemia 01/04/2019  ? Dyslipidemia associated with type 2 diabetes mellitus ([facility]) 11/30/2018  ? ? ?PCP: Georgina Quint. ? ?REFERRING PROVIDER: Tarry Kos, MD ? ?REFERRING DIAG: S73.191A (ICD-10-CM) - Tear of right acetabular labrum, initial encounter ? ?THERAPY DIAG:  ?Pain in right hip ? ?Muscle weakness (generalized) ? ?Stiffness of right hip, not elsewhere classified ? ?Difficulty in walking, not elsewhere classified ? ?ONSET DATE: 1.5 to 2 years ? ?SUBJECTIVE:  ? ?SUBJECTIVE STATEMENT: ?Pt  indicated onset of symptoms about 1.5 to 2 years ago.  Pt indicated he was jumping around that "tweaked it".  History of injection that helped for about 3 weeks (done around March).  Pt indicated he was given option for a surgery or therapy after MRI reporting.  Pt indicated hip pain "all around hip".  Pt indicated having good day and bad days.  Pt indicated trying to stretch but it doesn't help as much.   ? ?PERTINENT HISTORY: ?DM ? ?PAIN:  ?NPRS scale: at current 4/10, at worst 8/10 ?Pain location: Rt hip - groin/anterior, lateral hip ?Pain description: sharp pain, stiffness ?Aggravating factors: basketball activity (running, jumping), end range movements in different directions, walking prolonged, squatting ?Relieving factors: injection (short term), OTC medicine ? ?PRECAUTIONS: None ? ?WEIGHT BEARING RESTRICTIONS No ? ?FALLS:  ?Has patient fallen in last 6 months? No ? ?LIVING ENVIRONMENT: ?Stairs: no ? 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 ?PLOF: Independent, previously basketball, walking for exercise ? ?PATIENT GOALS Reduce pain ? ? ?OBJECTIVE:  ? ?DIAGNOSTIC FINDINGS:  ?08/06/2021 IE: MR arthrogram of the right hip shows a small anterior superior labral tear.  No other significant findings.  ? ?PATIENT SURVEYS:  ?08/06/2021 FOTO intake:  67  predicted: 78  ? ?COGNITION: ?08/06/2021 Overall cognitive status: Within functional limits for tasks assessed   ?  ?SENSATION: ?08/06/2021 WFL ? ?MUSCLE LENGTH: ?08/06/2021 : none tested today, check quad/hip flexors in future viist ? ? ?PALPATION: ?  08/06/2021 : tenderness c trigger points noted in lateral hip musculature (glute med/min) ? ?LE ROM: ? ?ROM Right ?08/06/2021 ?AROM Left ?08/06/2021 ?AROM  ?Hip flexion 90 c pain, measured in supine 100 measured in supine  ?Hip extension    ?Hip abduction    ?Hip adduction    ?Hip internal rotation 30 c pain measured in supine 90 deg flexion 40 measured in supine 90 deg flexion  ?Hip external rotation 35 c  pain measured in supine 90 deg flexion 35 measured in supine 90 deg flexion  ?Knee flexion    ?Knee extension    ?Ankle dorsiflexion    ?Ankle plantarflexion    ?Ankle inversion    ?Ankle eversion    ? (Blank rows = not tested) ? ?LE MMT: ? ?MMT Right ?08/06/2021 Left ?08/06/2021  ?Hip flexion 5/5 c mild pain 5/5  ?Hip extension    ?Hip abduction 4/5 c pain ?20.4, 21 lbs 5/5 ?35.5, 31.5 lbs  ?Hip adduction    ?Hip internal rotation    ?Hip external rotation    ?Knee flexion 5/5 5/5  ?Knee extension 5/5 5/5  ?Ankle dorsiflexion    ?Ankle plantarflexion    ?Ankle inversion    ?Ankle eversion    ? (Blank rows = not tested) ? ?LOWER EXTREMITY SPECIAL TESTS:  ?08/06/2021 + Pearlean Brownie on Rt c pain noted ? ?FUNCTIONAL TESTS:  ?08/06/2021  Lt SLS:  30 seconds unassisted on level surface Rt SLS: 30 seconds on level surface ?  18 inch chair transfer: Able to perform s UE assist, no complaints ?  Forward eccentric step down 6 inch ?   Lt: unremarkable ?   Rt: unremarkable ? ?GAIT: ?08/06/2021 ?Independent ambulation - mild decrease in Rt hip extension in late stance ? ? ? ?TODAY'S TREATMENT: ?08/06/2021 ?  Therex: ?   HEP instruction/performance c cues for techniques, handout provided.  Trial set performed of each for comprehension and symptom assessment.  See below for exercise list. ? ?PATIENT EDUCATION:  ?08/06/2021 ?Education details: HEP, POC ?Person educated: Patient ?Education method: Explanation, Demonstration, Verbal cues, and Handouts ?Education comprehension: verbalized understanding, verbal cues required, and needs further education ? ? ?HOME EXERCISE PROGRAM: ?08/06/2021 ?Access Code: [email or web address] ?URL: [email or web address] ?Date: 08/06/2021 ?Prepared by: Chyrel Masson ? ?Exercises ?- Clamshell (Mirrored)  - 1-2 x daily - 7 x weekly - 3 sets - 10-15 reps ?- Sidelying Reverse Clamshell (Mirrored)  - 1-2 x daily - 7 x weekly - 3 sets - 10-15 reps ?- Supine Bridge  - 1-2 x daily - 7 x weekly - 1-2 sets - 10-15 reps -  3-5 hold ?- Standing Hip Hiking  - 1-2 x daily - 7 x weekly - 1 sets - 10 reps - 5 hold ? ?ASSESSMENT: ? ?CLINICAL IMPRESSION: ?Patient is a 36 y.o. who comes to clinic with complaints of Rt hip pain with mobility, strength and movement coordination deficits that impair their ability to perform usual daily and recreational functional activities without increase difficulty/symptoms at this time.  Patient to benefit from skilled PT services to address impairments and limitations to improve to previous level of function without restriction secondary to condition.  ? ? ?OBJECTIVE IMPAIRMENTS Abnormal gait, decreased activity tolerance, decreased coordination, decreased endurance, decreased mobility, difficulty walking, decreased ROM, decreased strength, hypomobility, impaired perceived functional ability, impaired flexibility, and pain.  ? ?ACTIVITY LIMITATIONS cleaning, community activity, and occupation.  ? ?REHAB POTENTIAL: Good ? ?CLINICAL DECISION MAKING: Stable/uncomplicated ? ?EVALUATION COMPLEXITY: Low ? ? ?GOALS: ?Goals reviewed  with patient? Yes ?Eval date;  08/06/2021 ? ?Short term PT Goals (target date for Short term goals are 3 weeks 08/27/2021) ?Patient will demonstrate independent use of home exercise program to maintain progress from in clinic treatments. ?Goal status: New ?  ?Long term PT goals (target dates for all long term goals are 10 weeks  10/15/2021 ) ? ?1. Patient will demonstrate/report pain at worst less than or equal to 2/10 to facilitate minimal limitation in daily activity secondary to pain symptoms. ?Goal status: New ? ?2. Patient will demonstrate independent use of home exercise program to facilitate ability to maintain/progress functional gains from skilled physical therapy services. ?Goal status: New ? ?3. Patient will demonstrate FOTO outcome > or = 78 % to indicate reduced disability due to condition. ?Goal status: New ? ?4.  Patient will demonstrate Rt LE MMT 5/5 throughout to facilitate  ability to perform usual standing, walking, stairs at PLOF s limitation due to symptoms. ?Goal status: New ? ?5.  Patient will demonstrate Rt hip AROM mobility increase > or = 10 degrees c no symptoms to fa

## 2021-08-26 ENCOUNTER — Ambulatory Visit: Payer: 59 | Admitting: Physical Therapy

## 2021-08-26 ENCOUNTER — Encounter: Payer: Self-pay | Admitting: Physical Therapy

## 2021-08-26 DIAGNOSIS — M25651 Stiffness of right hip, not elsewhere classified: Secondary | ICD-10-CM

## 2021-08-26 DIAGNOSIS — R262 Difficulty in walking, not elsewhere classified: Secondary | ICD-10-CM

## 2021-08-26 DIAGNOSIS — M6281 Muscle weakness (generalized): Secondary | ICD-10-CM

## 2021-08-26 DIAGNOSIS — M25551 Pain in right hip: Secondary | ICD-10-CM

## 2021-08-26 NOTE — Therapy (Signed)
OUTPATIENT PHYSICAL THERAPY TREATMENT NOTE   Patient Name: Aaron Woods MRN: [id number] DOB:Feb 13, 1986, 36 y.o., male Today's Date: 08/26/2021  END OF SESSION:   PT End of Session - 08/26/21 0806     Visit Number 2    Number of Visits 20    Date for PT Re-Evaluation 10/15/21    Authorization Type [employer] $25 copay    Progress Note Due on Visit 10    PT Start Time 0800    PT Stop Time 0838    PT Time Calculation (min) 38 min    Activity Tolerance Patient tolerated treatment well    Behavior During Therapy WFL for tasks assessed/performed             Past Medical History:  Diagnosis Date   Borderline high blood pressure    Diabetes 1.5, managed as type 2 ([facility])    Diabetes mellitus    History reviewed. No pertinent surgical history. 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   REFERRING PROVIDER: Tarry Kos, MD   REFERRING DIAG: [phone] (ICD-10-CM) - Tear of right acetabular labrum, initial encounter   THERAPY DIAG:  Pain in right hip   Muscle weakness  (generalized)   Stiffness of right hip, not elsewhere classified   Difficulty in walking, not elsewhere classified   ONSET DATE: 1.5 to 2 years   SUBJECTIVE:    SUBJECTIVE STATEMENT: Pt indicated he hurt his hip last week training his son in football when he went to pivot and turn. PERTINENT HISTORY: DM   PAIN:  NPRS scale: at current 5/10 Pain location: Rt hip - groin/anterior, lateral hip Pain description: sharp pain, stiffness Aggravating factors: basketball activity (running, jumping), end range movements in different directions, walking prolonged, squatting Relieving factors: injection (short term), OTC medicine   PRECAUTIONS: None   WEIGHT BEARING RESTRICTIONS No   FALLS:  Has patient fallen in last 6 months? 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  No other significant findings.    PATIENT SURVEYS:  08/06/2021 FOTO intake:  67  predicted: 78    COGNITION: 08/06/2021 Overall cognitive status: Within functional limits for tasks assessed                               SENSATION: 08/06/2021 [facility]  MUSCLE LENGTH: 08/06/2021 : none tested today, check quad/hip flexors in future viist     PALPATION: 08/06/2021 : tenderness c trigger points noted in lateral hip musculature (glute med/min)   LE ROM:   ROM Right 08/06/2021 AROM Left 08/06/2021 AROM  Hip flexion 90 c pain, measured in supine 100 measured in supine  Hip extension      Hip abduction      Hip adduction      Hip internal rotation 30 c pain measured in supine 90 deg flexion 40 measured in supine 90 deg flexion  Hip external rotation 35 c pain measured in supine 90 deg flexion 35 measured in supine 90 deg flexion  Knee  flexion      Knee extension      Ankle dorsiflexion      Ankle plantarflexion      Ankle inversion      Ankle eversion       (Blank rows = not tested)   LE MMT:   MMT Right 08/06/2021 Left 08/06/2021  Hip flexion 5/5 c mild pain 5/5  Hip extension      Hip abduction 4/5 c pain 20.4, 21 lbs 5/5 35.5, 31.5 lbs  Hip adduction      Hip internal rotation      Hip external rotation      Knee flexion 5/5 5/5  Knee extension 5/5 5/5  Ankle dorsiflexion      Ankle plantarflexion      Ankle inversion      Ankle eversion       (Blank rows = not tested)   LOWER EXTREMITY SPECIAL TESTS:  08/06/2021 + Pearlean Brownie on Rt c pain noted   FUNCTIONAL TESTS:  08/06/2021        Lt SLS:             30 seconds unassisted on level surface       Rt SLS: 30 seconds on level surface                         18 inch chair transfer: Able to perform s UE assist, no complaints                         Forward eccentric step down 6 inch                                     Lt: unremarkable                                     Rt: unremarkable   GAIT: 08/06/2021 Independent ambulation - mild decrease in Rt hip extension in late stance       TODAY'S TREATMENT: 08/26/21 Recumbent bike L3 X 6 min Supine Rt hip flexor/quad stretch leg off EOB 3 X 30 sec Supine hip adductor stretch on Rt with strap 3 X 30 sec Supine SKTC stretch on Rt 3 X 30 sec Supine bridges 5 sec hold X 20 Supine SLR on Rt 2X10 Supine hip add bal squeeze 5 sec X 20 Sidelying reverse clam on Rt 2X10 holding 5 sec Sidelying clam with green band 2X10 Sidelying hip abduction SLR Rt 2X10 Single leg leg press on Rt 75# X 20 Standing hip hike on Rt 2X10  08/06/2021            Therex:                        HEP instruction/performance c cues for techniques, handout provided.  Trial set performed of each for comprehension and symptom assessment.  See below for exercise list.   PATIENT EDUCATION:  08/06/2021 Education details: HEP, POC Person  educated: Patient Education method: Explanation, Demonstration, Verbal cues, and Handouts Education comprehension: verbalized understanding, verbal cues required, and needs further education     HOME EXERCISE PROGRAM: 08/06/2021 Access Code: [email or web address] URL: [email or web address] Date: 08/06/2021 Prepared by: Chyrel Masson   Exercises - Clamshell (Mirrored)  - 1-2 x daily - 7 x weekly - 3 sets - 10-15 reps - Sidelying Reverse Clamshell (Mirrored)  - 1-2 x daily - 7 x weekly - 3 sets - 10-15 reps - Supine Bridge  - 1-2 x daily - 7 x weekly - 1-2 sets - 10-15 reps - 3-5 hold - Standing Hip Hiking  - 1-2 x daily - 7 x weekly - 1 sets - 10 reps - 5 hold   ASSESSMENT:   CLINICAL IMPRESSION: Session focused on HEP review along with additional overall hip strengthening exercises to his overall tolerance. He showed good return demonstration and understanding of HEP.      OBJECTIVE IMPAIRMENTS Abnormal gait, decreased activity tolerance, decreased coordination, decreased endurance, decreased mobility, difficulty walking, decreased ROM, decreased strength, hypomobility, impaired perceived functional ability, impaired flexibility, and pain.    ACTIVITY LIMITATIONS cleaning, community activity, and occupation.    REHAB POTENTIAL: Good   CLINICAL DECISION MAKING: Stable/uncomplicated   EVALUATION COMPLEXITY: Low     GOALS: Goals reviewed with patient? Yes Eval date;  08/06/2021   Short term PT Goals (target date for Short term goals are 3 weeks 08/27/2021) Patient will demonstrate independent use of home exercise program to maintain progress from in clinic treatments. Goal status: New   Long term PT goals (target dates for all long term goals are 10 weeks  10/15/2021 )   1. Patient will demonstrate/report pain at worst less than or equal to 2/10 to facilitate minimal limitation in daily activity secondary to pain symptoms. Goal status: New   2. Patient will demonstrate  independent use of home exercise program to facilitate ability to maintain/progress functional gains from skilled physical therapy services. Goal status: New   3. Patient will demonstrate FOTO outcome > or = 78 % to indicate reduced disability due to condition. Goal status: New   4.  Patient will demonstrate Rt LE MMT 5/5 throughout to facilitate ability to perform usual standing, walking, stairs at PLOF s limitation due to symptoms. Goal status: New   5.  Patient will demonstrate Rt hip AROM mobility increase > or = 10 degrees c no symptoms to facilitate mobility for daily life.   Goal status: New   6.  Patient will demonstrate/report ability to walk unrestricted due to symptoms.   Goal status: New     PLAN: PT FREQUENCY: 1-2x/week   PT DURATION: 10 weeks   PLANNED INTERVENTIONS: Therapeutic exercises, Therapeutic activity, Neuro Muscular re-education, Balance training, Gait training, Patient/Family education, Joint mobilization, Stair training, DME instructions, Dry Needling, Electrical stimulation, Cryotherapy, Moist heat, Taping, Ultrasound, Ionotophoresis 4mg /ml Dexamethasone, and Manual therapy.  All included unless contraindicated   PLAN FOR NEXT SESSION: gradual Rt hip strength and ROM progressions as tolerated, HEP progressions PRN   , PT,DPT 08/26/2021,  8:07 AM

## 2021-09-02 ENCOUNTER — Encounter: Payer: Self-pay | Admitting: Physical Therapy

## 2021-09-02 ENCOUNTER — Ambulatory Visit: Payer: 59 | Admitting: Physical Therapy

## 2021-09-02 DIAGNOSIS — M25551 Pain in right hip: Secondary | ICD-10-CM | POA: Diagnosis not present

## 2021-09-02 DIAGNOSIS — R262 Difficulty in walking, not elsewhere classified: Secondary | ICD-10-CM | POA: Diagnosis not present

## 2021-09-02 DIAGNOSIS — M6281 Muscle weakness (generalized): Secondary | ICD-10-CM | POA: Diagnosis not present

## 2021-09-02 DIAGNOSIS — M25651 Stiffness of right hip, not elsewhere classified: Secondary | ICD-10-CM

## 2021-09-02 NOTE — Therapy (Signed)
OUTPATIENT PHYSICAL THERAPY TREATMENT NOTE   Patient Name: Aaron Woods MRN: [id number] DOB:05/29/1985, 36 y.o., male Today's Date: 09/02/2021  END OF SESSION:   PT End of Session - 09/02/21 0804     Visit Number 3    Number of Visits 20    Date for PT Re-Evaluation 10/15/21    Authorization Type [employer] $25 copay    Progress Note Due on Visit 10    PT Start Time 0803    PT Stop Time 0842    PT Time Calculation (min) 39 min    Activity Tolerance Patient tolerated treatment well    Behavior During Therapy WFL for tasks assessed/performed             Past Medical History:  Diagnosis Date   Borderline high blood pressure    Diabetes 1.5, managed as type 2 (HCC)    Diabetes mellitus    History reviewed. No pertinent surgical history. 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   REFERRING PROVIDER: Xu, Naiping M, MD   REFERRING DIAG: S73.191A (ICD-10-CM) - Tear of right acetabular labrum, initial encounter   THERAPY DIAG:  Pain in right hip   Muscle weakness  (generalized)   Stiffness of right hip, not elsewhere classified   Difficulty in walking, not elsewhere classified   ONSET DATE: 1.5 to 2 years   SUBJECTIVE:    SUBJECTIVE STATEMENT:  He states his hip gets sores, overall feels about the same. He has been doing HEP PERTINENT HISTORY: DM   PAIN:  NPRS scale: 4/10 Pain location: Rt hip - groin/anterior, lateral hip Pain description: sharp pain, stiffness Aggravating factors: basketball activity (running, jumping), end range movements in different directions, walking prolonged, squatting Relieving factors: injection (short term), OTC medicine   PRECAUTIONS: None   WEIGHT BEARING RESTRICTIONS No   FALLS:  Has patient fallen in last 6 months? 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  No other significant findings.    PATIENT SURVEYS:  08/06/2021 FOTO intake:  67  predicted: 78    COGNITION: 08/06/2021 Overall cognitive status: Within functional limits for tasks assessed                               SENSATION: 08/06/2021 WFL   MUSCLE LENGTH: 08/06/2021 : none   tested today, check quad/hip flexors in future viist     PALPATION: 08/06/2021 : tenderness c trigger points noted in lateral hip musculature (glute med/min)   LE ROM:   ROM Right 08/06/2021 AROM Left 08/06/2021 AROM Right 09/02/21 AROM  Hip flexion 90 c pain, measured in supine 100 measured in supine 95 measured in supine  Hip extension       Hip abduction       Hip adduction       Hip internal rotation 30 c pain measured in supine 90 deg flexion 40 measured in supine 90 deg flexion   Hip external rotation 35 c pain measured in supine 90 deg flexion 35 measured in supine 90  deg flexion   Knee flexion       Knee extension       Ankle dorsiflexion       Ankle plantarflexion       Ankle inversion       Ankle eversion        (Blank rows = not tested)   LE MMT:   MMT Right 08/06/2021 Left 08/06/2021  Hip flexion 5/5 c mild pain 5/5  Hip extension      Hip abduction 4/5 c pain 20.4, 21 lbs 5/5 35.5, 31.5 lbs  Hip adduction      Hip internal rotation      Hip external rotation      Knee flexion 5/5 5/5  Knee extension 5/5 5/5  Ankle dorsiflexion      Ankle plantarflexion      Ankle inversion      Ankle eversion       (Blank rows = not tested)   LOWER EXTREMITY SPECIAL TESTS:  08/06/2021 + Corky Sox on Rt c pain noted   FUNCTIONAL TESTS:  08/06/2021        Lt SLS:             30 seconds unassisted on level surface       Rt SLS: 30 seconds on level surface                         18 inch chair transfer: Able to perform s UE assist, no complaints                         Forward eccentric step down 6 inch                                     Lt: unremarkable                                     Rt: unremarkable   GAIT: 08/06/2021 Independent ambulation - mild decrease in Rt hip extension in late stance       TODAY'S TREATMENT: 09/02/21 Recumbent bike L3 X 8 min Supine Rt hip flexor/quad stretch leg off EOB 3 X 30 sec Supine figure 4 stretch 3 X 30 sec Supine SKTC stretch on Rt 3 X 30 sec Supine single leg bridge on Rt 2X10 Supine SLR on Rt with slight hip ER 2X10 Supine hip add bal squeeze 5 sec X 20 Sidelying hip abduction SLR Rt 2X15 Prone hip extension Rt 2X15 Quadriped hip abduction in 90 deg knee flexion 2X10 Lateral stepping with mini  squat and green band around knees, 3 round trips at counter top [employer] walking with green band around knees 3 round trips at counter top. Standing hip hike on Rt 2X10  08/26/21 Recumbent bike L3 X 6 min Supine Rt hip flexor/quad stretch leg off EOB 3 X 30 sec Supine hip adductor stretch on Rt with strap 3 X  30 sec Supine SKTC stretch on Rt 3 X 30 sec Supine bridges 5 sec hold X 20 Supine SLR on Rt 2X10 Supine hip add bal squeeze 5 sec X 20 Sidelying reverse clam on Rt 2X10 holding 5 sec Sidelying clam with green band 2X10 Sidelying hip abduction SLR Rt 2X10 Single leg leg press on Rt 75# X 20 Standing hip hike on Rt 2X10    PATIENT EDUCATION:  08/06/2021 Education details: HEP, POC Person educated: Patient Education method: Explanation, Demonstration, Verbal cues, and Handouts Education comprehension: verbalized understanding, verbal cues required, and needs further education     HOME EXERCISE PROGRAM: 08/06/2021 Access Code: [email or web address] URL: [email or web address] Date: 08/06/2021 Prepared by: Michael Wright   Exercises - Clamshell (Mirrored)  - 1-2 x daily - 7 x weekly - 3 sets - 10-15 reps - Sidelying Reverse Clamshell (Mirrored)  - 1-2 x daily - 7 x weekly - 3 sets - 10-15 reps - Supine Bridge  - 1-2 x daily - 7 x weekly - 1-2 sets - 10-15 reps - 3-5 hold - Standing Hip Hiking  - 1-2 x daily - 7 x weekly - 1 sets - 10 reps - 5 hold   ASSESSMENT:   CLINICAL IMPRESSION: He still reports pain and soreness in his Rt hip, overall he is progressing his strength exercises with good tolerance and showed improved hip flexion ROM noted today. PT recommending to continue current PT plan of care.     OBJECTIVE IMPAIRMENTS Abnormal gait, decreased activity tolerance, decreased coordination, decreased endurance, decreased mobility, difficulty walking, decreased ROM, decreased strength, hypomobility, impaired perceived functional ability, impaired flexibility, and pain.    ACTIVITY LIMITATIONS cleaning, community activity, and occupation.    REHAB POTENTIAL: Good   CLINICAL DECISION MAKING: Stable/uncomplicated   EVALUATION COMPLEXITY: Low     GOALS: Goals reviewed with patient? Yes Eval date;  08/06/2021   Short term PT Goals (target date for Short term goals are 3  weeks 08/27/2021) Patient will demonstrate independent use of home exercise program to maintain progress from in clinic treatments. Goal status: MET   Long term PT goals (target dates for all long term goals are 10 weeks  10/15/2021 )   1. Patient will demonstrate/report pain at worst less than or equal to 2/10 to facilitate minimal limitation in daily activity secondary to pain symptoms. Goal status: ongoing   2. Patient will demonstrate independent use of home exercise program to facilitate ability to maintain/progress functional gains from skilled physical therapy services. Goal status: ongoing   3. Patient will demonstrate FOTO outcome > or = 78 % to indicate reduced disability due to condition. Goal status: ongoing   4.  Patient will demonstrate Rt LE MMT 5/5 throughout to facilitate ability to perform usual standing, walking, stairs at PLOF s limitation due to symptoms. Goal status: ongoing   5.  Patient will demonstrate Rt hip AROM mobility increase > or = 10 degrees c no symptoms to facilitate mobility for daily life.   Goal status: ongoing   6.  Patient will demonstrate/report ability to walk unrestricted due to symptoms.   Goal status: ongoing       PLAN: PT FREQUENCY: 1-2x/week   PT DURATION: 10 weeks   PLANNED INTERVENTIONS: Therapeutic exercises, Therapeutic activity, Neuro Muscular re-education, Balance training, Gait training, Patient/Family education, Joint mobilization, Stair training, DME instructions, Dry Needling, Electrical stimulation, Cryotherapy, Moist heat, Taping, Ultrasound, Ionotophoresis 31m/ml Dexamethasone, and Manual therapy.  All included unless contraindicated   PLAN FOR NEXT SESSION: gradual Rt hip strength and ROM progressions as tolerated, HEP progressions PRN, update strength measurements.  BDebbe Odea PT,DPT 09/02/2021, 8:05 AM

## 2021-09-09 ENCOUNTER — Encounter: Payer: Self-pay | Admitting: Rehabilitative and Restorative Service Providers"

## 2021-09-09 ENCOUNTER — Ambulatory Visit: Payer: 59 | Admitting: Rehabilitative and Restorative Service Providers"

## 2021-09-09 DIAGNOSIS — M25651 Stiffness of right hip, not elsewhere classified: Secondary | ICD-10-CM | POA: Diagnosis not present

## 2021-09-09 DIAGNOSIS — R262 Difficulty in walking, not elsewhere classified: Secondary | ICD-10-CM

## 2021-09-09 DIAGNOSIS — M6281 Muscle weakness (generalized): Secondary | ICD-10-CM | POA: Diagnosis not present

## 2021-09-09 DIAGNOSIS — M25551 Pain in right hip: Secondary | ICD-10-CM | POA: Diagnosis not present

## 2021-09-09 NOTE — Therapy (Signed)
OUTPATIENT PHYSICAL THERAPY TREATMENT NOTE   Patient Name: Aaron Woods MRN: [id number] DOB:1985/05/25, 36 y.o., male Today's Date: 09/09/2021  END OF SESSION:   PT End of Session - 09/09/21 1028     Visit Number 4    Number of Visits 20    Date for PT Re-Evaluation 10/15/21    Authorization Type [employer] $25 copay    Progress Note Due on Visit 10    PT Start Time 1022    PT Stop Time 1100    PT Time Calculation (min) 38 min    Activity Tolerance Patient tolerated treatment well    Behavior During Therapy WFL for tasks assessed/performed              Past Medical History:  Diagnosis Date   Borderline high blood pressure    Diabetes 1.5, managed as type 2 ([place])    Diabetes mellitus    History reviewed. No pertinent surgical history. 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   REFERRING PROVIDER: Leandrew Koyanagi, MD   REFERRING DIAG: [id number] (ICD-10-CM) - Tear of right acetabular labrum, initial encounter   THERAPY DIAG:  Pain in right hip   Muscle weakness  (generalized)   Stiffness of right hip, not elsewhere classified   Difficulty in walking, not elsewhere classified   ONSET DATE: 1.5 to 2 years   SUBJECTIVE:    SUBJECTIVE STATEMENT:  Pt indicated pain at worst 4/10 in last few days.  No pain upon arrival.   Pain reported from deep inside joint.   Global rating of change rated a little bit better +2.  Pt indicated more mobility in stretching.  Reduced frequency of symptoms reported.    PERTINENT HISTORY: DM   PAIN:  NPRS scale: 4/10 at worst, 0/10 upon arrival Pain location: Rt hip - groin/anterior, lateral hip Pain description: sharp pain, stiffness Aggravating factors: squatting , getting out of car.  Relieving factors: OTC medicine   PRECAUTIONS: None   WEIGHT BEARING RESTRICTIONS No   FALLS:  Has patient fallen in last 6 months? No   LIVING ENVIRONMENT: Stairs: no   OCCUPATION: Production assistant, [employer] for city - desk work primary   PLOF: Independent, previously basketball, walking for exercise   PATIENT GOALS Reduce pain     OBJECTIVE:    DIAGNOSTIC FINDINGS:  08/06/2021 IE: MR arthrogram of the right hip shows a small anterior superior labral tear.  No other significant findings.    PATIENT SURVEYS:  09/09/2021:  FOTO update 65%  08/06/2021 FOTO intake:  67  predicted: 78    COGNITION: 08/06/2021 Overall cognitive status: Within functional limits for tasks assessed  SENSATION: 08/06/2021 WFL   MUSCLE LENGTH: 08/06/2021 : none tested today, check quad/hip flexors in future viist     PALPATION: 08/06/2021 : tenderness c trigger points noted in lateral hip musculature (glute med/min)   LE ROM:   ROM Right 08/06/2021 AROM Left 08/06/2021 AROM Right 09/02/21 AROM Right 09/09/2021 AROM  Hip flexion 90 c pain, measured in supine 100 measured in supine 95 measured in supine 115 in supine  Hip extension        Hip abduction        Hip adduction        Hip internal rotation 30 c  pain measured in supine 90 deg flexion 40 measured in supine 90 deg flexion    Hip external rotation 35 c pain measured in supine 90 deg flexion 35 measured in supine 90 deg flexion  40 c pain, measured in supine 90 deg flexion  Knee flexion        Knee extension        Ankle dorsiflexion        Ankle plantarflexion        Ankle inversion        Ankle eversion         (Blank rows = not tested)   LE MMT:   MMT Right 08/06/2021 Left 08/06/2021  Hip flexion 5/5 c mild pain 5/5  Hip extension      Hip abduction 4/5 c pain 20.4, 21 lbs 5/5 35.5, 31.5 lbs  Hip adduction      Hip internal rotation      Hip external rotation      Knee flexion 5/5 5/5  Knee extension 5/5 5/5  Ankle dorsiflexion      Ankle plantarflexion      Ankle inversion      Ankle eversion       (Blank rows = not tested)   LOWER EXTREMITY SPECIAL TESTS:  08/06/2021 + Corky Sox on Rt c pain noted   FUNCTIONAL TESTS:  08/06/2021        Lt SLS:             30 seconds unassisted on level surface       Rt SLS: 30 seconds on level surface                         18 inch chair transfer: Able to perform s UE assist, no complaints                         Forward eccentric step down 6 inch                                     Lt: unremarkable                                     Rt: unremarkable   GAIT: 08/06/2021 Independent ambulation - mild decrease in Rt hip extension in late stance       TODAY'S TREATMENT: 09/09/2021 Recumbent bike L3 X 8 min, seat  Supine Rt figure 4 stretch push away 30 sec x 5  Sidelying Rt hip abduction SLR 2 x 10 (added for home) Supine Rt single leg bridge in figure 4 position x 10    Single leg press Rt  50 lbs 2 x 15   Review of existing HEP c handout updated  Manual:  Rt hip inferior glides g3, lateral glides G3, distraction  09/02/21 Recumbent bike L3 X 8 min Supine Rt hip flexor/quad stretch leg off EOB 3 X 30 sec Supine figure 4 stretch 3 X 30 sec Supine SKTC stretch on Rt 3 X 30  sec Supine single leg bridge on Rt 2X10 Supine SLR on Rt with slight hip ER 2X10 Supine hip add bal squeeze 5 sec X 20 Sidelying hip abduction SLR Rt 2X15 Prone hip extension Rt 2X15 Quadriped hip abduction in 90 deg knee flexion 2X10 Lateral stepping with mini squat and green band around knees, 3 round trips at [employer] walking with green band around knees 3 round trips at counter top. Standing hip hike on Rt 2X10   PATIENT EDUCATION:  08/06/2021 Education details: HEP, POC Person educated: Patient Education method: Explanation, Demonstration, Verbal cues, and Handouts Education comprehension: verbalized understanding, verbal cues required, and needs further education     HOME EXERCISE PROGRAM: 08/06/2021 Access Code: [email or web address] URL: [email or web address] Date: 08/06/2021 Prepared by: Scot Jun   Exercises - Clamshell (Mirrored)  - 1-2 x daily - 7 x weekly - 3 sets - 10-15 reps - Sidelying Reverse Clamshell (Mirrored)  - 1-2 x daily - 7 x weekly - 3 sets - 10-15 reps - Supine Bridge  - 1-2 x daily - 7 x weekly - 1-2 sets - 10-15 reps - 3-5 hold - Standing Hip Hiking  - 1-2 x daily - 7 x weekly - 1 sets - 10 reps - 5 hold   ASSESSMENT:   CLINICAL IMPRESSION: Positive gain in Rt hip joint mobility noted today post manual intervention.  Continued restriction in flexion/ER that impairs functional movement patterns from PLOF.  Pt reported mild progressions noted to this point but continued deficits still noted.   Encouraged scheduling more visits for continued skilled PT services at this time ( had one remaining).      OBJECTIVE IMPAIRMENTS Abnormal gait, decreased activity tolerance, decreased coordination, decreased endurance, decreased mobility, difficulty walking, decreased ROM, decreased strength, hypomobility, impaired perceived functional ability, impaired flexibility, and pain.    ACTIVITY LIMITATIONS cleaning, community activity, and occupation.     REHAB POTENTIAL: Good   CLINICAL DECISION MAKING: Stable/uncomplicated   EVALUATION COMPLEXITY: Low     GOALS: Goals reviewed with patient? Yes Eval date;  08/06/2021   Short term PT Goals (target date for Short term goals are 3 weeks 08/27/2021) Patient will demonstrate independent use of home exercise program to maintain progress from in clinic treatments. Goal status: MET   Long term PT goals (target dates for all long term goals are 10 weeks  10/15/2021 )   1. Patient will demonstrate/report pain at worst less than or equal to 2/10 to facilitate minimal limitation in daily activity secondary to pain symptoms. Goal status: ongoing - assessed 09/09/2021   2. Patient will demonstrate independent use of home exercise program to facilitate ability to maintain/progress functional gains from skilled physical therapy services. Goal status: ongoing - assessed 09/09/2021   3. Patient will demonstrate FOTO outcome > or = 78 % to indicate reduced disability due to condition. Goal status: ongoing - assessed 09/09/2021   4.  Patient will demonstrate Rt LE MMT 5/5 throughout to facilitate ability to perform usual standing, walking, stairs at PLOF s limitation due to symptoms. Goal status: ongoing - assessed 09/09/2021   5.  Patient will demonstrate  Rt hip AROM mobility increase > or = 10 degrees c no symptoms to facilitate mobility for daily life.   Goal status: ongoing - assessed 09/09/2021   6.  Patient will demonstrate/report ability to walk unrestricted due to symptoms.   Goal status: ongoing - assessed 09/09/2021     PLAN: PT FREQUENCY: 1-2x/week   PT DURATION: 10 weeks   PLANNED INTERVENTIONS: Therapeutic exercises, Therapeutic activity, Neuro Muscular re-education, Balance training, Gait training, Patient/Family education, Joint mobilization, Stair training, DME instructions, Dry Needling, Electrical stimulation, Cryotherapy, Moist heat, Taping, Ultrasound, Ionotophoresis 5m/ml  Dexamethasone, and Manual therapy.  All included unless contraindicated   PLAN FOR NEXT SESSION: manual for Rt hip mobility gains, progressive strengthening.   MScot Jun PT, DPT, OCS, ATC 09/09/21  10:59 AM

## 2021-09-16 ENCOUNTER — Encounter: Payer: Self-pay | Admitting: Rehabilitative and Restorative Service Providers"

## 2021-09-16 ENCOUNTER — Ambulatory Visit: Payer: 59 | Admitting: Rehabilitative and Restorative Service Providers"

## 2021-09-16 DIAGNOSIS — M6281 Muscle weakness (generalized): Secondary | ICD-10-CM

## 2021-09-16 DIAGNOSIS — M25651 Stiffness of right hip, not elsewhere classified: Secondary | ICD-10-CM | POA: Diagnosis not present

## 2021-09-16 DIAGNOSIS — M25551 Pain in right hip: Secondary | ICD-10-CM | POA: Diagnosis not present

## 2021-09-16 DIAGNOSIS — R262 Difficulty in walking, not elsewhere classified: Secondary | ICD-10-CM | POA: Diagnosis not present

## 2021-09-30 ENCOUNTER — Ambulatory Visit: Payer: 59 | Admitting: Rehabilitative and Restorative Service Providers"

## 2021-09-30 ENCOUNTER — Encounter: Payer: Self-pay | Admitting: Rehabilitative and Restorative Service Providers"

## 2021-09-30 DIAGNOSIS — M25651 Stiffness of right hip, not elsewhere classified: Secondary | ICD-10-CM

## 2021-09-30 DIAGNOSIS — R262 Difficulty in walking, not elsewhere classified: Secondary | ICD-10-CM

## 2021-09-30 DIAGNOSIS — M25551 Pain in right hip: Secondary | ICD-10-CM | POA: Diagnosis not present

## 2021-09-30 DIAGNOSIS — M6281 Muscle weakness (generalized): Secondary | ICD-10-CM | POA: Diagnosis not present

## 2021-09-30 NOTE — Therapy (Signed)
OUTPATIENT PHYSICAL THERAPY TREATMENT NOTE   Patient Name: Aaron Woods MRN: [id number] DOB:May 17, 1985, 36 y.o., male Today's Date: 09/30/2021  END OF SESSION:   PT End of Session - 09/30/21 0806     Visit Number 6    Number of Visits 20    Date for PT Re-Evaluation 10/15/21    Authorization Type [employer] $25 copay    Progress Note Due on Visit 10    PT Start Time 0802    PT Stop Time 0841    PT Time Calculation (min) 39 min    Activity Tolerance Patient tolerated treatment well    Behavior During Therapy WFL for tasks assessed/performed                Past Medical History:  Diagnosis Date   Borderline high blood pressure    Diabetes 1.5, managed as type 2 ([place])    Diabetes mellitus    History reviewed. No pertinent surgical history. 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   REFERRING PROVIDER: Leandrew Koyanagi, MD   REFERRING DIAG: [phone] (ICD-10-CM) - Tear of right acetabular labrum, initial encounter   THERAPY DIAG:  Pain in right hip   Muscle  weakness (generalized)   Stiffness of right hip, not elsewhere classified   Difficulty in walking, not elsewhere classified   ONSET DATE: 1.5 to 2 years   SUBJECTIVE:    SUBJECTIVE STATEMENT:  Pt indicated pain at worst in last week or so at 6/10.  Reporting doing some basketball without pain during.  Had some pain increase.     PERTINENT HISTORY: DM   PAIN:  NPRS scale: 6/10  Pain location: Rt hip - groin/anterior, lateral hip Pain description: sharp pain, stiffness Aggravating factors: squatting , getting out of car.  Relieving factors: OTC medicine   PRECAUTIONS: None   WEIGHT BEARING RESTRICTIONS No   FALLS:  Has patient fallen in last 6 months? No   LIVING ENVIRONMENT: Stairs: no   OCCUPATION: Production assistant, [employer] - desk work primary   PLOF: Independent, previously basketball, walking for exercise   PATIENT GOALS Reduce pain     OBJECTIVE:    DIAGNOSTIC FINDINGS:  08/06/2021 IE: MR arthrogram of the right hip shows a small anterior superior labral tear.  No other significant findings.    PATIENT SURVEYS:  09/09/2021:  FOTO update 65%  08/06/2021 FOTO intake:  67  predicted: 78    COGNITION: 08/06/2021 Overall cognitive status: Within functional limits for tasks assessed  SENSATION: 08/06/2021 WFL   MUSCLE LENGTH: 08/06/2021 : none tested today, check quad/hip flexors in future viist     PALPATION: 08/06/2021 : tenderness c trigger points noted in lateral hip musculature (glute med/min)   LE ROM:   ROM Right 08/06/2021 AROM Left 08/06/2021 AROM Right 09/02/21 AROM Right 09/09/2021 AROM Right 09/16/2021 Right 09/30/2021 AROM  Hip flexion 90 c pain, measured in supine 100 measured in supine 95 measured in supine 115 in supine  118 in supine   Hip extension          Hip abduction          Hip adduction          Hip internal rotation 30 c pain measured in supine 90 deg flexion 40 measured in supine 90 deg flexion    48 measured in supine 90 deg flexion 55 measured in supine 90 deg flexion   Hip external rotation 35 c pain measured in supine 90 deg flexion 35 measured in supine 90 deg flexion  40 c pain, measured in supine 90 deg flexion 50 measured in supine 90 deg flexion 53 measured in supine 90 deg flexion  Knee flexion          Knee extension          Ankle dorsiflexion          Ankle plantarflexion          Ankle inversion          Ankle eversion           (Blank rows = not tested)   LE MMT:   MMT Right 08/06/2021 Left 08/06/2021 Right 09/30/2021  Hip flexion 5/5 c mild pain 5/5   Hip extension       Hip abduction 4/5 c pain 20.4, 21 lbs 5/5 35.5, 31.5 lbs 5/5 32.2  Hip adduction       Hip internal rotation       Hip external rotation       Knee flexion 5/5 5/5   Knee extension 5/5 5/5   Ankle dorsiflexion       Ankle plantarflexion       Ankle inversion       Ankle eversion        (Blank rows = not tested)   LOWER EXTREMITY SPECIAL TESTS:  08/06/2021 + Corky Sox on Rt c pain noted   FUNCTIONAL TESTS:  08/06/2021        Lt SLS:             30 seconds unassisted on level surface       Rt SLS: 30 seconds on level surface                         18 inch chair transfer: Able to perform s UE assist, no complaints                         Forward eccentric step down 6 inch                                     Lt: unremarkable                                     Rt: unremarkable  GAIT: 08/06/2021 Independent ambulation - mild decrease in Rt hip extension in late stance       TODAY'S TREATMENT: 09/30/2021  Supine Rt figure 4 stretch push away 30 sec x 3 - done in highest flexion tolerance as well as instruction for lower flexion (on table) Supine Rt figure 4 stretch pull towards 30 sec x 3 Single leg press Rt 62 lbs 2 x 15  leg stabilizations SLS c rotations Lt and Rt 12 lb kettle bell x 15, performed bilateral Doorway hip hike c leg press into doorframe hold 5 sec x 10       Manual:   Rt hip inferior glides g3, lateral glides G3, distraction.  PA glides in ER in prone g3 mobs.  Contract/relax for ER mobility gains    09/16/2021 Prone opposite arm/leg lift off table 3 sec hold x 10 bilateral Supine Rt figure 4 stretch push away 30 sec x 3  Recumbent bike lvl 3 6 mins   Single leg press Rt 62 lbs 2 x 15  Lateral step down c hip hike on top of step 6 inch height Rt leg x 15 Lateral stepping green band around lower leg 15 ft x 5 both ways in partial squat.    Manual:   Rt hip inferior glides g3, lateral glides G3, distraction.  PA glides in ER in prone g3 mobs.  Contract/relax for ER mobility gains   09/09/2021 Recumbent bike L3 X 8 min, seat  Supine Rt figure 4 stretch push away 30 sec x 5  Sidelying Rt hip abduction SLR 2 x 10 (added for home) Supine Rt single leg bridge in figure 4 position x 10    Single leg press Rt 50 lbs 2 x 15   Review of existing HEP c handout updated  Manual:  Rt hip inferior glides g3, lateral glides G3, distraction   PATIENT EDUCATION:  09/30/2021 Education details: HEP, POC Person educated: Patient Education method: Consulting civil engineer, Media planner, Verbal cues, and Handouts Education comprehension: verbalized understanding, verbal cues required, and needs further education     HOME EXERCISE PROGRAM: Access Code: [email or web address] URL: [email or web address] Date: 09/30/2021 Prepared by: Scot Jun  Exercises - Sidelying Reverse Clamshell (Mirrored)  - 1-2 x daily - 7 x weekly - 3 sets - 10-15 reps - Supine Bridge  - 1-2 x daily - 7 x weekly - 1-2 sets - 10-15 reps - 3-5 hold - Standing Hip Hiking  - 1-2 x daily - 7 x weekly - 1 sets - 10 reps - 5 hold - Supine Figure 4 Piriformis Stretch  - 2-3 x daily - 7 x weekly - 1 sets - 5 reps - 30 hold - Sidelying Hip Abduction  - 1-2 x daily - 7 x weekly - 2-3 sets - 10-15 reps - Supine Single Knee to Chest (Mirrored)  - 2-3 x daily - 7 x weekly - 1 sets - 5 reps  - 30 hold - Prone Alternating Arm and Leg Lifts  - 1-2 x daily - 7 x weekly - 1-2 sets - 10 reps - 3 hold   ASSESSMENT:   CLINICAL IMPRESSION: Pt has demonstrated maintaining gains in AROM measured over last few weeks.  Continued ER mobility restriction noted as primary functional impairment for daily life (getting dressed for example).  Continued mobility improvements to help improve functional mobility.   Continued skilled PT services c HEP indicated to progress mobility, strength and stability for daily activity.      OBJECTIVE IMPAIRMENTS Abnormal  gait, decreased activity tolerance, decreased coordination, decreased endurance, decreased mobility, difficulty walking, decreased ROM, decreased strength, hypomobility, impaired perceived functional ability, impaired flexibility, and pain.    ACTIVITY LIMITATIONS cleaning, community activity, and occupation.    REHAB POTENTIAL: Good   CLINICAL DECISION MAKING: Stable/uncomplicated   EVALUATION COMPLEXITY: Low     GOALS: Goals reviewed with patient? Yes Eval date;  08/06/2021   Short term PT Goals (target date for Short term goals are 3 weeks 08/27/2021) Patient will demonstrate independent use of home exercise program to maintain progress from in clinic treatments. Goal status: MET   Long term PT goals (target dates for all long term goals are 10 weeks  10/15/2021 )   1. Patient will demonstrate/report pain at worst less than or equal to 2/10 to facilitate minimal limitation in daily activity secondary to pain symptoms. Goal status: ongoing - assessed 09/30/2021   2. Patient will demonstrate independent use of home exercise program to facilitate ability to maintain/progress functional gains from skilled physical therapy services. Goal status: ongoing - assessed 09/30/2021   3. Patient will demonstrate FOTO outcome > or = 78 % to indicate reduced disability due to condition. Goal status: ongoing - assessed 09/30/2021   4.  Patient will  demonstrate Rt LE MMT 5/5 throughout to facilitate ability to perform usual standing, walking, stairs at PLOF s limitation due to symptoms. Goal status: ongoing - assessed 09/30/2021   5.  Patient will demonstrate Rt hip AROM mobility increase > or = 10 degrees c no symptoms to facilitate mobility for daily life.   Goal status: ongoing - assessed 09/30/2021   6.  Patient will demonstrate/report ability to walk unrestricted due to symptoms.   Goal status: ongoing - assessed 09/30/2021     PLAN: PT FREQUENCY: 1-2x/week   PT DURATION: 10 weeks   PLANNED INTERVENTIONS: Therapeutic exercises, Therapeutic activity, Neuro Muscular re-education, Balance training, Gait training, Patient/Family education, Joint mobilization, Stair training, DME instructions, Dry Needling, Electrical stimulation, Cryotherapy, Moist heat, Taping, Ultrasound, Ionotophoresis 77m/ml Dexamethasone, and Manual therapy.  All included unless contraindicated   PLAN FOR NEXT SESSION: FOTO reassessment, recert upcoming.    MScot Jun PT, DPT, OCS, ATC 09/30/21  8:40 AM

## 2021-10-07 ENCOUNTER — Encounter: Payer: 59 | Admitting: Rehabilitative and Restorative Service Providers"

## 2021-10-07 ENCOUNTER — Telehealth: Payer: Self-pay | Admitting: Rehabilitative and Restorative Service Providers"

## 2021-10-07 NOTE — Telephone Encounter (Signed)
Called after no show 20 mins for appointment.  Unable to leave message.   Chyrel Masson, PT, DPT, OCS, ATC 10/07/21  8:23 AM

## 2021-10-14 ENCOUNTER — Encounter: Payer: Self-pay | Admitting: Rehabilitative and Restorative Service Providers"

## 2021-10-14 ENCOUNTER — Ambulatory Visit: Payer: 59 | Admitting: Rehabilitative and Restorative Service Providers"

## 2021-10-14 DIAGNOSIS — M25551 Pain in right hip: Secondary | ICD-10-CM

## 2021-10-14 DIAGNOSIS — M6281 Muscle weakness (generalized): Secondary | ICD-10-CM | POA: Diagnosis not present

## 2021-10-14 DIAGNOSIS — M25651 Stiffness of right hip, not elsewhere classified: Secondary | ICD-10-CM | POA: Diagnosis not present

## 2021-10-14 DIAGNOSIS — R262 Difficulty in walking, not elsewhere classified: Secondary | ICD-10-CM

## 2021-10-14 NOTE — Therapy (Signed)
OUTPATIENT PHYSICAL THERAPY TREATMENT NOTE   Patient Name: Aaron Woods MRN: [id number] DOB:Nov 30, 1985, 36 y.o., male Today's Date: 10/14/2021  END OF SESSION:   PT End of Session - 10/14/21 0811     Visit Number 7    Number of Visits 20    Date for PT Re-Evaluation 10/15/21    Authorization Type [employer] $25 copay    Progress Note Due on Visit 10    PT Start Time 0802    PT Stop Time 0841    PT Time Calculation (min) 39 min    Activity Tolerance Patient tolerated treatment well    Behavior During Therapy WFL for tasks assessed/performed                 Past Medical History:  Diagnosis Date   Borderline high blood pressure    Diabetes 1.5, managed as type 2 ([place])    Diabetes mellitus    History reviewed. No pertinent surgical history. 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   REFERRING PROVIDER: Leandrew Koyanagi, MD   REFERRING DIAG: [phone] (ICD-10-CM) - Tear of right acetabular labrum, initial encounter   THERAPY DIAG:  Pain in right hip   Muscle  weakness (generalized)   Stiffness of right hip, not elsewhere classified   Difficulty in walking, not elsewhere classified   ONSET DATE: 1.5 to 2 years   SUBJECTIVE:    SUBJECTIVE STATEMENT:  Pt indicated he hasn't done a lot of stretching this week, been busy this week.  Pt indicated pain hasn't been too.    PERTINENT HISTORY: DM   PAIN:  NPRS scale: 4/10  Pain location: Rt hip - groin/anterior, lateral hip Pain description: sharp pain, stiffness Aggravating factors: squatting , getting out of car.  Relieving factors: OTC medicine   PRECAUTIONS: None   WEIGHT BEARING RESTRICTIONS No   FALLS:  Has patient fallen in last 6 months? No   LIVING ENVIRONMENT: Stairs: no   OCCUPATION: Production assistant, [employer] - desk work primary   PLOF: Independent, previously basketball, walking for exercise   PATIENT GOALS Reduce pain     OBJECTIVE:    DIAGNOSTIC FINDINGS:  08/06/2021 IE: MR arthrogram of the right hip shows a small anterior superior labral tear.  No other significant findings.    PATIENT SURVEYS:  10/14/2021:  FOTO update: 82 %  09/09/2021:  FOTO update 65%  08/06/2021 FOTO intake:  67  predicted: 78    COGNITION: 08/06/2021 Overall cognitive status: Within functional limits for tasks assessed  SENSATION: 08/06/2021 WFL   MUSCLE LENGTH: 08/06/2021 : none tested today, check quad/hip flexors in future viist     PALPATION: 08/06/2021 : tenderness c trigger points noted in lateral hip musculature (glute med/min)   LE ROM:   ROM Right 08/06/2021 AROM Left 08/06/2021 AROM Right 09/02/21 AROM Right 09/09/2021 AROM Right 09/16/2021 Right 09/30/2021 AROM Right 10/14/2021  Hip flexion 90 c pain, measured in supine 100 measured in supine 95 measured in supine 115 in supine  118 in supine    Hip extension           Hip abduction           Hip adduction           Hip internal rotation 30 c pain measured in supine 90 deg flexion  40 measured in supine 90 deg flexion   48 measured in supine 90 deg flexion 55 measured in supine 90 deg flexion    Hip external rotation 35 c pain measured in supine 90 deg flexion 35 measured in supine 90 deg flexion  40 c pain, measured in supine 90 deg flexion 50 measured in supine 90 deg flexion 53 measured in supine 90 deg flexion 60 PROM in supine 90 deg flexion  Knee flexion           Knee extension           Ankle dorsiflexion           Ankle plantarflexion           Ankle inversion           Ankle eversion            (Blank rows = not tested)   LE MMT:   MMT Right 08/06/2021 Left 08/06/2021 Right 09/30/2021  Hip flexion 5/5 c mild pain 5/5   Hip extension       Hip abduction 4/5 c pain 20.4, 21 lbs 5/5 35.5, 31.5 lbs 5/5 32.2  Hip adduction       Hip internal rotation       Hip external rotation       Knee flexion 5/5 5/5   Knee extension 5/5 5/5   Ankle dorsiflexion       Ankle plantarflexion       Ankle inversion       Ankle eversion        (Blank rows = not tested)   LOWER EXTREMITY SPECIAL TESTS:  08/06/2021 + Corky Sox on Rt c pain noted   FUNCTIONAL TESTS:  08/06/2021        Lt SLS:             30 seconds unassisted on level surface       Rt SLS: 30 seconds on level surface                         18 inch chair transfer: Able to perform s UE assist, no complaints                         Forward eccentric step down 6 inch                                     Lt: unremarkable  Rt: unremarkable   GAIT: 08/06/2021 Independent ambulation - mild decrease in Rt hip extension in late stance       TODAY'S TREATMENT: 10/14/2021 Therex: Supine Rt figure 4 stretch push away 30 sec x 3  Supine Rt figure 4 stretch pull towards 30 sec x 3 Double leg press 100 lbs x 15, Single leg press Rt 62 lbs 2 x 15  Lateral step down 6 inch x 15 c hip hike on top of step 2-3 sec hold, performed bilateral Lateral side stepping blue band 15 ft x 3  each way Monster walk blue band 15 ft x 3 fwd/back  Neuro re-ed SLS c vector reaching fwd/lateral/back with contralateral leg x 8  Leg stabilizations SLS c rotations Lt and Rt 12 lb kettle bell x 15, performed on Rt leg only     09/30/2021  Supine Rt figure 4 stretch push away 30 sec x 3 - done in highest flexion tolerance as well as instruction for lower flexion (on table) Supine Rt figure 4 stretch pull towards 30 sec x 3 Single leg press Rt 62 lbs 2 x 15  leg stabilizations SLS c rotations Lt and Rt 12 lb kettle bell x 15, performed bilateral Doorway hip hike c leg press into doorframe hold 5 sec x 10      Manual:   Rt hip inferior glides g3, lateral glides G3, distraction.  PA glides in ER in prone g3 mobs.  Contract/relax for ER mobility gains    09/16/2021 Prone opposite arm/leg lift off table 3 sec hold x 10 bilateral Supine Rt figure 4 stretch push away 30 sec x 3  Recumbent bike lvl 3 6 mins   Single leg press Rt 62 lbs 2 x 15  Lateral step down c hip hike on top of step 6 inch height Rt leg x 15 Lateral stepping green band around lower leg 15 ft x 5 both ways in partial squat.    Manual:   Rt hip inferior glides g3, lateral glides G3, distraction.  PA glides in ER in prone g3 mobs.  Contract/relax for ER mobility gains    PATIENT EDUCATION:  09/30/2021 Education details: HEP, POC Person educated: Patient Education method: Consulting civil engineer, Demonstration, Verbal cues, and Handouts Education comprehension: verbalized understanding, verbal cues required, and needs further education     HOME EXERCISE PROGRAM: Access Code: [email or web address] URL: [email or web address] Date: 09/30/2021 Prepared by: Scot Jun  Exercises - Sidelying Reverse Clamshell (Mirrored)  - 1-2 x daily - 7 x weekly - 3 sets - 10-15 reps - Supine Bridge  - 1-2 x daily - 7 x weekly - 1-2 sets - 10-15 reps - 3-5 hold - Standing Hip Hiking  - 1-2 x daily - 7 x weekly - 1 sets - 10 reps - 5  hold - Supine Figure 4 Piriformis Stretch  - 2-3 x daily - 7 x weekly - 1 sets - 5 reps - 30 hold - Sidelying Hip Abduction  - 1-2 x daily - 7 x weekly - 2-3 sets - 10-15 reps - Supine Single Knee to Chest (Mirrored)  - 2-3 x daily - 7 x weekly - 1 sets - 5 reps - 30 hold - Prone Alternating Arm and Leg Lifts  - 1-2 x daily - 7 x weekly - 1-2 sets - 10 reps - 3 hold   ASSESSMENT:   CLINICAL IMPRESSION: Reviewed existing HEP for mobility, cues for emphasis on routine use to promote mobility.  Focused today included increased  strengthening program for functional strength control improvements.  Good performance overall without pain response.      OBJECTIVE IMPAIRMENTS Abnormal gait, decreased activity tolerance, decreased coordination, decreased endurance, decreased mobility, difficulty walking, decreased ROM, decreased strength, hypomobility, impaired perceived functional ability, impaired flexibility, and pain.    ACTIVITY LIMITATIONS cleaning, community activity, and occupation.    REHAB POTENTIAL: Good   CLINICAL DECISION MAKING: Stable/uncomplicated   EVALUATION COMPLEXITY: Low     GOALS: Goals reviewed with patient? Yes Eval date;  08/06/2021   Short term PT Goals (target date for Short term goals are 3 weeks 08/27/2021) Patient will demonstrate independent use of home exercise program to maintain progress from in clinic treatments. Goal status: MET   Long term PT goals (target dates for all long term goals are 10 weeks  10/15/2021 )   1. Patient will demonstrate/report pain at worst less than or equal to 2/10 to facilitate minimal limitation in daily activity secondary to pain symptoms. Goal status: ongoing - assessed 09/30/2021   2. Patient will demonstrate independent use of home exercise program to facilitate ability to maintain/progress functional gains from skilled physical therapy services. Goal status: ongoing - assessed 09/30/2021   3. Patient will demonstrate FOTO  outcome > or = 78 % to indicate reduced disability due to condition. Goal status: ongoing - assessed 09/30/2021   4.  Patient will demonstrate Rt LE MMT 5/5 throughout to facilitate ability to perform usual standing, walking, stairs at PLOF s limitation due to symptoms. Goal status: ongoing - assessed 09/30/2021   5.  Patient will demonstrate Rt hip AROM mobility increase > or = 10 degrees c no symptoms to facilitate mobility for daily life.   Goal status: ongoing - assessed 09/30/2021   6.  Patient will demonstrate/report ability to walk unrestricted due to symptoms.   Goal status: ongoing - assessed 09/30/2021     PLAN: PT FREQUENCY: 1-2x/week   PT DURATION: 10 weeks   PLANNED INTERVENTIONS: Therapeutic exercises, Therapeutic activity, Neuro Muscular re-education, Balance training, Gait training, Patient/Family education, Joint mobilization, Stair training, DME instructions, Dry Needling, Electrical stimulation, Cryotherapy, Moist heat, Taping, Ultrasound, Ionotophoresis 21m/ml Dexamethasone, and Manual therapy.  All included unless contraindicated   PLAN FOR NEXT SESSION: Recert required upon return (ROM/strength assessment).  Quick direction changes.   MScot Jun PT, DPT, OCS, ATC 10/14/21  8:40 AM

## 2021-10-21 ENCOUNTER — Encounter: Payer: Self-pay | Admitting: Rehabilitative and Restorative Service Providers"

## 2021-10-21 ENCOUNTER — Ambulatory Visit: Payer: 59 | Admitting: Rehabilitative and Restorative Service Providers"

## 2021-10-21 DIAGNOSIS — M25551 Pain in right hip: Secondary | ICD-10-CM

## 2021-10-21 DIAGNOSIS — M25651 Stiffness of right hip, not elsewhere classified: Secondary | ICD-10-CM | POA: Diagnosis not present

## 2021-10-21 DIAGNOSIS — R262 Difficulty in walking, not elsewhere classified: Secondary | ICD-10-CM

## 2021-10-21 DIAGNOSIS — M6281 Muscle weakness (generalized): Secondary | ICD-10-CM

## 2021-10-21 NOTE — Therapy (Addendum)
OUTPATIENT PHYSICAL THERAPY TREATMENT NOTE/ RECERTIFICATION [place]   Patient Name: Aaron Woods MRN: [id number] DOB:[birth year], 36 y.o., male Today's Date: 10/21/2021  Progress Note Reporting Period 08/06/2021 to 10/21/2021  See note below for Objective Data and Assessment of Progress/Goals.    END OF SESSION:   PT End of Session - 10/21/21 0807     Visit Number 8    Number of Visits 20    Date for PT Re-Evaluation 12/02/21    Authorization Type [employer] $25 copay    Progress Note Due on Visit 18    PT Start Time 0806    PT Stop Time 0844    PT Time Calculation (min) 38 min    Activity Tolerance Patient tolerated treatment well    Behavior During Therapy WFL for tasks assessed/performed                  Past Medical History:  Diagnosis Date   Borderline high blood pressure    Diabetes 1.5, managed as type 2 ([place])    Diabetes mellitus    History reviewed. No pertinent surgical history. 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   REFERRING PROVIDER:  Leandrew Koyanagi, MD   REFERRING DIAG: [phone] (ICD-10-CM) - Tear of right acetabular labrum, initial encounter   THERAPY DIAG:  Pain in right hip   Muscle weakness (generalized)   Stiffness of right hip, not elsewhere classified   Difficulty in walking, not elsewhere classified   ONSET DATE: 1.5 to 2 years   SUBJECTIVE:    SUBJECTIVE STATEMENT:  Pt indicated feeling stiffness c some pain today.  Pt indicated riding bike for first time in a while on Saturday. Reported 50% back to normal at this time.    PERTINENT HISTORY: DM   PAIN:  NPRS scale: 6/10  upon arrival Pain location: Rt hip - groin/anterior, lateral hip Pain description: sharp pain, stiffness Aggravating factors: squatting , getting out of car.  Relieving factors: OTC medicine   PRECAUTIONS: None   WEIGHT BEARING RESTRICTIONS No   FALLS:  Has patient fallen in last 6 months? No   LIVING ENVIRONMENT: Stairs: no   OCCUPATION: Production assistant, [employer] - desk work primary   PLOF: Independent, previously basketball, walking for exercise   PATIENT GOALS Reduce pain     OBJECTIVE:    DIAGNOSTIC FINDINGS:  08/06/2021 IE: MR arthrogram of the right hip shows a small anterior superior labral tear.  No other significant findings.    PATIENT SURVEYS:  10/14/2021:  FOTO update: 82 %  09/09/2021:  FOTO update 65%  08/06/2021 FOTO intake:  67  predicted: 78  COGNITION: 08/06/2021 Overall cognitive status: Within functional limits for tasks assessed                               SENSATION: 08/06/2021 [facility]   MUSCLE LENGTH: 08/06/2021 : none tested today, check quad/hip flexors in future viist     PALPATION: 08/06/2021 : tenderness c trigger points noted in lateral hip musculature (glute med/min)   LE ROM:   ROM Right 08/06/2021 AROM Left 08/06/2021 AROM Right 09/02/21 AROM Right 09/09/2021 AROM Right 09/16/2021 Right 09/30/2021 AROM Right 10/14/2021 Right 10/21/2021   Hip flexion 90 c pain,  measured in supine 100 measured in supine 95 measured in supine 115 in supine  118 in supine   120 AROM in supine  Hip extension            Hip abduction            Hip adduction            Hip internal rotation 30 c pain measured in supine 90 deg flexion 40 measured in supine 90 deg flexion   48 measured in supine 90 deg flexion 55 measured in supine 90 deg flexion   54 AROM in supine 90 deg flexion   Hip external rotation 35 c pain measured in supine 90 deg flexion 35 measured in supine 90 deg flexion  40 c pain, measured in supine 90 deg flexion 50 measured in supine 90 deg flexion 53 measured in supine 90 deg flexion 60 PROM in supine 90 deg flexion 52 AROM in supine 90 deg flexion  Knee flexion            Knee extension            Ankle dorsiflexion            Ankle plantarflexion            Ankle inversion            Ankle eversion             (Blank rows = not tested)   LE MMT:   MMT Right 08/06/2021 Left 08/06/2021 Right 09/30/2021 Right 10/21/2021  Hip flexion 5/5 c mild pain 5/5  5/5  Hip extension        Hip abduction 4/5 c pain 20.4, 21 lbs 5/5 35.5, 31.5 lbs 5/5 32.2 5/5 33.9 lbs  Hip adduction        Hip internal rotation        Hip external rotation        Knee flexion 5/5 5/5    Knee extension 5/5 5/5    Ankle dorsiflexion        Ankle plantarflexion        Ankle inversion        Ankle eversion         (Blank rows = not tested)   LOWER EXTREMITY SPECIAL TESTS:  08/06/2021 + Corky Sox on Rt c pain noted   FUNCTIONAL TESTS:  08/06/2021        Lt SLS:             30 seconds unassisted on level surface       Rt SLS: 30 seconds on level surface                         18 inch chair transfer: Able to perform s UE assist, no complaints  Forward eccentric step down 6 inch                                     Lt: unremarkable                                     Rt: unremarkable   GAIT: 08/06/2021 Independent ambulation - mild decrease in Rt hip  extension in late stance       TODAY'S TREATMENT: 10/21/2021 Therex:  Nustep lvl 6 7 mins UE/LE Double leg press 112 lbs x 15, Single leg press Rt 62 lbs 2 x 15  Lateral side stepping blue band 15 ft x 3 each way Monster walk blue band 15 ft x 3 fwd/back TRX squat x 20   Neuro re-ed Lateral stepping 3 cones x 8 bilateral SLS c vector reaching fwd/lateral/back with contralateral leg x 8      10/14/2021 Therex: Supine Rt figure 4 stretch push away 30 sec x 3  Supine Rt figure 4 stretch pull towards 30 sec x 3 Double leg press 100 lbs x 15, Single leg press Rt 62 lbs 2 x 15  Lateral step down 6 inch x 15 c hip hike on top of step 2-3 sec hold, performed bilateral Lateral side stepping blue band 15 ft x 3 each way Monster walk blue band 15 ft x 3 fwd/back  Neuro re-ed SLS c vector reaching fwd/lateral/back with contralateral leg x 8  Leg stabilizations SLS c rotations Lt and Rt 12 lb kettle bell x 15, performed on Rt leg only     09/30/2021  Supine Rt figure 4 stretch push away 30 sec x 3 - done in highest flexion tolerance as well as instruction for lower flexion (on table) Supine Rt figure 4 stretch pull towards 30 sec x 3 Single leg press Rt 62 lbs 2 x 15  leg stabilizations SLS c rotations Lt and Rt 12 lb kettle bell x 15, performed bilateral Doorway hip hike c leg press into doorframe hold 5 sec x 10      Manual:   Rt hip inferior glides g3, lateral glides G3, distraction.  PA glides in ER in prone g3 mobs.  Contract/relax for ER mobility gains    PATIENT EDUCATION:  09/30/2021 Education details: HEP, POC Person educated: Patient Education method: Consulting civil engineer, Demonstration, Verbal cues, and Handouts Education comprehension: verbalized understanding, verbal cues required, and needs further education     HOME EXERCISE PROGRAM: Access Code: [email or web address] URL: [email or web address] Date: 09/30/2021 Prepared by: Scot Jun  Exercises - Sidelying  Reverse Clamshell (Mirrored)  - 1-2 x daily - 7 x weekly - 3 sets - 10-15 reps - Supine Bridge  - 1-2 x daily - 7 x weekly - 1-2 sets - 10-15 reps - 3-5 hold - Standing Hip Hiking  - 1-2 x daily - 7 x weekly - 1 sets - 10 reps - 5 hold - Supine Figure 4 Piriformis Stretch  - 2-3 x daily - 7 x weekly - 1 sets - 5 reps - 30 hold - Sidelying Hip Abduction  - 1-2 x daily - 7 x weekly - 2-3 sets - 10-15 reps - Supine Single Knee to Chest (Mirrored)  - 2-3 x daily - 7 x weekly - 1 sets - 5 reps - 30 hold - Prone Alternating Arm  and Leg Lifts  - 1-2 x daily - 7 x weekly - 1-2 sets - 10 reps - 3 hold   ASSESSMENT:   CLINICAL IMPRESSION: Pt has attended 8 visits overall during course of treatment.  Pt rated overall improvement back to normal around 50% at this time. See objective data for updated information show various gains in mobility, strength to this point.  Gains have been noted but continued Rt hip pain c mobility deficits can still impair and limit functional daily activity, specifically recreational activity.  Continued PT services recommended at this time.      OBJECTIVE IMPAIRMENTS Abnormal gait, decreased activity tolerance, decreased coordination, decreased endurance, decreased mobility, difficulty walking, decreased ROM, decreased strength, hypomobility, impaired perceived functional ability, impaired flexibility, and pain.    ACTIVITY LIMITATIONS cleaning, community activity, and occupation.    REHAB POTENTIAL: Good   CLINICAL DECISION MAKING: Stable/uncomplicated   EVALUATION COMPLEXITY: Low     GOALS: Goals reviewed with patient? Yes Eval date;  08/06/2021   Short term PT Goals (target date for Short term goals are 3 weeks 08/27/2021) Patient will demonstrate independent use of home exercise program to maintain progress from in clinic treatments. Goal status: MET   Long term PT goals (target dates for all long term goals are 10 weeks  12/02/2021 )   1. Patient will  demonstrate/report pain at worst less than or equal to 2/10 to facilitate minimal limitation in daily activity secondary to pain symptoms. Goal status: revised   2. Patient will demonstrate independent use of home exercise program to facilitate ability to maintain/progress functional gains from skilled physical therapy services. Goal status:revised    3. Patient will demonstrate FOTO outcome > or = 78 % to indicate reduced disability due to condition. Goal status: revised    4.  Patient will demonstrate Rt LE MMT 5/5 throughout to facilitate ability to perform usual standing, walking, stairs at PLOF s limitation due to symptoms. Goal status: MET - assessed 10/21/2021   5.  Patient will demonstrate Rt hip AROM mobility increase > or = 10 degrees c no symptoms to facilitate mobility for daily life.   Goal status: revised    6.  Patient will demonstrate/report ability to walk unrestricted due to symptoms.   Goal status: MET - assessed 10/21/2021      PLAN: PT FREQUENCY: 1x/week   PT DURATION: 6 weeks   PLANNED INTERVENTIONS: Therapeutic exercises, Therapeutic activity, Neuro Muscular re-education, Balance training, Gait training, Patient/Family education, Joint mobilization, Stair training, DME instructions, Dry Needling, Electrical stimulation, Cryotherapy, Moist heat, Taping, Ultrasound, Ionotophoresis 80m/ml Dexamethasone, and Manual therapy.  All included unless contraindicated   PLAN FOR NEXT SESSION: Trial of HEP over next week or so.  1x/week max scheduling due to improvements to this point.  Continued mobility improvements.   MScot Jun PT, DPT, OCS, ATC 10/21/21  8:44 AM  PHYSICAL THERAPY DISCHARGE SUMMARY  Visits from Start of Care: 8  Current functional level related to goals / functional outcomes: See note   Remaining deficits: See note   Education / Equipment: HEP  Patient goals were  mostly met . Patient is being discharged due to not returning since the last  visit.  MScot Jun PT, DPT, OCS, ATC 11/21/21  3:21 PM

## 2021-10-21 NOTE — Addendum Note (Signed)
Addended by: Chyrel Masson B on: 10/21/2021 08:47 AM   Modules accepted: Orders

## 2021-10-22 ENCOUNTER — Other Ambulatory Visit: Payer: Self-pay | Admitting: Internal Medicine

## 2021-10-22 NOTE — Telephone Encounter (Signed)
Please refill as per office routine med refill policy (all routine meds to be refilled for 3 mo or monthly (per pt preference) up to one year from last visit, then month to month grace period for 3 mo, then further med refills will have to be denied) ? ?

## 2021-11-09 ENCOUNTER — Other Ambulatory Visit: Payer: Self-pay | Admitting: Internal Medicine

## 2021-12-07 ENCOUNTER — Other Ambulatory Visit: Payer: Self-pay | Admitting: Internal Medicine

## 2021-12-09 ENCOUNTER — Other Ambulatory Visit: Payer: Self-pay | Admitting: Internal Medicine

## 2022-01-04 ENCOUNTER — Other Ambulatory Visit: Payer: Self-pay | Admitting: Internal Medicine

## 2022-01-23 ENCOUNTER — Telehealth: Payer: Self-pay | Admitting: Emergency Medicine

## 2022-01-23 DIAGNOSIS — E113413 Type 2 diabetes mellitus with severe nonproliferative diabetic retinopathy with macular edema, bilateral: Secondary | ICD-10-CM

## 2022-01-23 NOTE — Telephone Encounter (Signed)
Called patient and left message  to inform him that a referral has been placed to a different endocrinologist.   If patient calls back please inform him of above message

## 2022-01-23 NOTE — Telephone Encounter (Signed)
Patient called and said he wants to get a referral to a different endocrinologist. He said his provider left and he would like to go to a different facility

## 2022-02-02 ENCOUNTER — Other Ambulatory Visit: Payer: Self-pay | Admitting: Internal Medicine

## 2022-02-19 ENCOUNTER — Other Ambulatory Visit: Payer: Self-pay | Admitting: Internal Medicine

## 2022-02-28 ENCOUNTER — Telehealth: Payer: Self-pay | Admitting: Internal Medicine

## 2022-02-28 ENCOUNTER — Encounter: Payer: Self-pay | Admitting: Internal Medicine

## 2022-02-28 NOTE — Telephone Encounter (Signed)
Patients wife called and they are requesting a referral to a different practice and endocrinologist.  Dr Talmage Nap with [facility] - fax # [phone]

## 2022-02-28 NOTE — Telephone Encounter (Signed)
Patient was calling regarding release of records. He will complete paperwork at the new office and they will send it over to get records.

## 2022-03-01 ENCOUNTER — Other Ambulatory Visit: Payer: Self-pay | Admitting: Emergency Medicine

## 2022-03-01 DIAGNOSIS — E1159 Type 2 diabetes mellitus with other circulatory complications: Secondary | ICD-10-CM

## 2022-03-04 ENCOUNTER — Telehealth: Payer: Self-pay | Admitting: Emergency Medicine

## 2022-03-04 ENCOUNTER — Other Ambulatory Visit: Payer: Self-pay | Admitting: Internal Medicine

## 2022-03-04 ENCOUNTER — Other Ambulatory Visit: Payer: Self-pay | Admitting: Emergency Medicine

## 2022-03-04 ENCOUNTER — Telehealth: Payer: Self-pay | Admitting: Internal Medicine

## 2022-03-04 MED ORDER — TRESIBA FLEXTOUCH 200 UNIT/ML ~~LOC~~ SOPN: 40.0000 [IU] | PEN_INJECTOR | Freq: Every day | SUBCUTANEOUS | 6 refills | Status: DC

## 2022-03-04 NOTE — Telephone Encounter (Signed)
New prescription sent to pharmacy of record today.  Thanks.

## 2022-03-04 NOTE — Telephone Encounter (Signed)
Per patients' request, Lesly Dukes, MD has been removed as your Endocrinologist. 02/28/22

## 2022-03-04 NOTE — Telephone Encounter (Signed)
Caller & Relationship to patient: Spouse  Call back number: [phone]   Date of last office visit: 2.2.23  Date of next office visit: 12.14.23  Medication(s) to be refilled: insulin degludec (TRESIBA FLEXTOUCH) 200 UNIT/ML FlexTouch Pen   Preferred Pharmacy:  [facility] [phone]  Phone: [phone]  Fax: [phone]    Pt know that this RX is usually filled by his endodontologist, but pt is seeing a different [facility] facility than we referred him to and won't be able to get in there until 12.19.23, and will be out of his medication until then.

## 2022-03-06 ENCOUNTER — Encounter: Payer: Self-pay | Admitting: Emergency Medicine

## 2022-03-06 ENCOUNTER — Ambulatory Visit: Payer: 59 | Admitting: Emergency Medicine

## 2022-03-06 VITALS — BP 122/70 | HR 90 | Temp 98.3°F | Ht 69.0 in | Wt 243.0 lb

## 2022-03-06 DIAGNOSIS — E1169 Type 2 diabetes mellitus with other specified complication: Secondary | ICD-10-CM | POA: Diagnosis not present

## 2022-03-06 DIAGNOSIS — E1159 Type 2 diabetes mellitus with other circulatory complications: Secondary | ICD-10-CM | POA: Diagnosis not present

## 2022-03-06 DIAGNOSIS — G4733 Obstructive sleep apnea (adult) (pediatric): Secondary | ICD-10-CM

## 2022-03-06 DIAGNOSIS — I152 Hypertension secondary to endocrine disorders: Secondary | ICD-10-CM

## 2022-03-06 DIAGNOSIS — E785 Hyperlipidemia, unspecified: Secondary | ICD-10-CM

## 2022-03-06 LAB — POCT GLYCOSYLATED HEMOGLOBIN (HGB A1C): Hemoglobin A1C: 10.9 % — AB (ref 4.0–5.6)

## 2022-03-06 NOTE — Progress Notes (Signed)
Aaron Woods 36 y.o.   Chief Complaint  Patient presents with   Follow-up    HISTORY OF PRESENT ILLNESS: This is a 36 y.o. male here for follow-up of diabetes and hypertension. Has appointment to see endocrinologist next week. Presently on metformin, Victoza, Jardiance, and Tresiba insulin  HPI   Prior to Admission medications   Medication Sig Start Date End Date Taking? Authorizing Provider  acetaminophen (TYLENOL) 325 MG tablet Take 650 mg by mouth every 6 (six) hours as needed for mild pain or headache.   Yes [provider]  BD PEN NEEDLE MICRO U/F 32G X 6 MM MISC USE 1  ONCE DAILY IN  AFTERNOON 11/11/21  Yes Shamleffer, Melanie Crazier, MD  blood glucose meter kit and supplies by Other route as directed. Dispense based on patient and insurance preference. Use up to four times daily as directed. (FOR ICD-10 E10.9, E11.9).   Yes [provider]  empagliflozin (JARDIANCE) 25 MG TABS tablet Take 1 tablet (25 mg total) by mouth daily before breakfast. 02/28/21  Yes Shamleffer, Melanie Crazier, MD  glucose blood (ONETOUCH VERIO) test strip 1 each by Other route daily in the afternoon. Use as instructed 02/28/21  Yes Shamleffer, Melanie Crazier, MD  insulin degludec (TRESIBA FLEXTOUCH) 200 UNIT/ML FlexTouch Pen Inject 40 Units into the skin daily in the afternoon. 03/04/22  Yes Lexany Belknap, Ines Bloomer, MD  Insulin Pen Needle (B-D UF III MINI PEN NEEDLES) 31G X 5 MM MISC USE AS DIRECTED 1-4 TIMES A DAILY 11/11/21  Yes Shamleffer, Melanie Crazier, MD  liraglutide (VICTOZA) 18 MG/3ML SOPN Inject 1.8 mg into the skin daily in the afternoon. 04/17/21  Yes Shamleffer, Melanie Crazier, MD  rosuvastatin (CRESTOR) 20 MG tablet Take 1 tablet by mouth once daily 10/22/21  Yes Jazmine Heckman, Ines Bloomer, MD  telmisartan-hydrochlorothiazide (MICARDIS HCT) 80-12.5 MG tablet Take 1 tablet by mouth once daily 03/01/22  Yes Burma Ketcher, Ines Bloomer, MD    Allergies  Allergen Reactions   Almond  (Diagnostic) Itching    Patient Active Problem List   Diagnosis Date Noted   Tear of right acetabular labrum 08/02/2021   Tachycardia 04/25/2021   Chronic intermittent hypoxia with obstructive sleep apnea 04/01/2021   Postviral fatigue syndrome 02/27/2021   Hypersomnia with sleep apnea 02/27/2021   Snoring 02/27/2021   Excessive daytime sleepiness 02/27/2021   Suspected sleep apnea 11/22/2020   Hypertension associated with diabetes ([place]) 10/12/2020   Microcytic anemia 12/01/2019   Obesity (BMI 30.0-34.9) 12/01/2019   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 No pertinent surgical history.  Social History   Socioeconomic History   Marital status: Married    Spouse name: [name]   Number of children: 3   Years of education: Not on file   Highest education level: High school graduate  Occupational History   Not on file  Tobacco Use   Smoking status: Never   Smokeless tobacco: Never  Substance and Sexual Activity   Alcohol use: No   Drug use: No   Sexual activity: Not on file  Other Topics Concern   Not on file  Social  History Narrative   Live at home with wife and 3 kids   Right handed   Caffeine: 1 cup of tea in the AM   Social Determinants of Health   Financial Resource Strain: Not on file  Food Insecurity: Not on file  Transportation Needs: Not on file  Physical Activity: Not on file  Stress: Not on file  Social Connections: Not on file  Intimate Partner Violence: Not on file    Family History  Problem Relation Age of Onset   Diabetes Mother    Diabetes Brother    Diabetes Brother     Diabetes Brother    Diabetes Maternal Grandmother      Review of Systems  Constitutional: Negative.  Negative for chills and fever.  HENT: Negative.  Negative for congestion and sore throat.   Respiratory: Negative.  Negative for cough and shortness of breath.   Cardiovascular: Negative.  Negative for chest pain and palpitations.  Gastrointestinal:  Negative for abdominal pain, diarrhea, nausea and vomiting.  Genitourinary: Negative.   Skin: Negative.  Negative for rash.  Neurological: Negative.  Negative for dizziness and headaches.  All other systems reviewed and are negative.  Today's Vitals   03/06/22 1611  BP: 122/70  Pulse: 90  Temp: 98.3 F (36.8 C)  TempSrc: Oral  SpO2: 95%  Weight: 243 lb (110.2 kg)  Height: _0  (1.753 m)   Body mass index is 35.88 kg/m. Wt Readings from Last 3 Encounters:  03/06/22 243 lb (110.2 kg)  07/12/21 235 lb (106.6 kg)  07/03/21 247 lb (112 kg)     Physical Exam Vitals reviewed.  Constitutional:      Appearance: Normal appearance. He is obese.  HENT:     Head: Normocephalic.     Mouth/Throat:     Mouth: Mucous membranes are moist.     Pharynx: Oropharynx is clear.  Eyes:     Extraocular Movements: Extraocular movements intact.     Pupils: Pupils are equal, round, and reactive to light.  Cardiovascular:     Rate and Rhythm: Normal rate.  Pulmonary:     Effort: Pulmonary effort is normal.  Musculoskeletal:     Cervical back: No tenderness.  Skin:    General: Skin is warm and dry.  Neurological:     Mental Status: He is alert and oriented to person, place, and time.  Psychiatric:        Mood and Affect: Mood normal.        Behavior: Behavior normal.    Results for orders placed or performed in visit on 03/06/22 (from the past 24 hour(s))  POCT glycosylated hemoglobin (Hb A1C)     Status: Abnormal   Collection Time: 03/06/22  4:24 PM  Result Value Ref Range   Hemoglobin A1C 10.9 (A) 4.0 - 5.6 %   HbA1c POC (<>  result, manual entry)     HbA1c, POC (prediabetic range)     HbA1c, POC (controlled diabetic range)       ASSESSMENT & PLAN: A total of 47 minutes was spent with the patient and counseling/coordination of care regarding preparing for this visit, review of most recent office visit notes, review of multiple chronic medical conditions and their management, cardiovascular risks associated with uncontrolled diabetes, review of all medications, education on nutrition, review of most recent blood work results including interpretation of today's hemoglobin A1c, prognosis, documentation, and need for follow-up with endocrinologist next week and with me in 3 months..  Problem List Items  Addressed This Visit       Cardiovascular and Mediastinum   Hypertension associated with diabetes ([place]) - Primary    Well-controlled hypertension. Continue Micardis HCTZ 80-12.5 mg daily BP Readings from Last 3 Encounters:  03/06/22 122/70  07/03/21 121/83  04/25/21 128/82  Uncontrolled diabetes with hemoglobin A1c at 10.9. Cardiovascular risks associated with uncontrolled diabetes discussed. Diet and nutrition discussed. Patient has appointment to see endocrinologist next week. I will defer medication changes to him. For now continue Tresiba 40 units daily, Jardiance 25 mg daily, Victoza 1 point milligram daily.       Relevant Orders   POCT glycosylated hemoglobin (Hb A1C) (Completed)   CBC with Differential/Platelet   Comprehensive metabolic panel   Lipid panel   Urine Microalbumin w/creat. ratio     Respiratory   OSA on CPAP    Stable.  On CPAP treatment.        Endocrine   Dyslipidemia associated with type 2 diabetes mellitus ([place])    Stable.  Diet and nutrition discussed.  Continue rosuvastatin 20 mg daily.       Patient Instructions  Diabetes Mellitus and Nutrition, Adult When you have diabetes, or diabetes mellitus, it is very important to have healthy eating habits because your blood  sugar (glucose) levels are greatly affected by what you eat and drink. Eating healthy foods in the right amounts, at about the same times every day, can help you: Manage your blood glucose. Lower your risk of heart disease. Improve your blood pressure. Reach or maintain a healthy weight. What can affect my meal plan? Every person with diabetes is different, and each person has different needs for a meal plan. Your health care provider may recommend that you work with a dietitian to make a meal plan that is best for you. Your meal plan may vary depending on factors such as: The calories you need. The medicines you take. Your weight. Your blood glucose, blood pressure, and cholesterol levels. Your activity level. Other health conditions you have, such as heart or kidney disease. How do carbohydrates affect me? Carbohydrates, also called carbs, affect your blood glucose level more than any other type of food. Eating carbs raises the amount of glucose in your blood. It is important to know how many carbs you can safely have in each meal. This is different for every person. Your dietitian can help you calculate how many carbs you should have at each meal and for each snack. How does alcohol affect me? Alcohol can cause a decrease in blood glucose (hypoglycemia), especially if you use insulin or take certain diabetes medicines by mouth. Hypoglycemia can be a life-threatening condition. Symptoms of hypoglycemia, such as sleepiness, dizziness, and confusion, are similar to symptoms of having too much alcohol. Do not drink alcohol if: Your health care provider tells you not to drink. You are pregnant, may be pregnant, or are planning to become pregnant. If you drink alcohol: Limit how much you have to: 0-1 drink a day for women. 0-2 drinks a day for men. Know how much alcohol is in your drink. In the [place] one drink equals one 12 oz bottle of beer (355 mL), one 5 oz glass of wine (148 mL), or one 1  oz glass of hard liquor (44 mL). Keep yourself hydrated with water, diet soda, or unsweetened iced tea. Keep in mind that regular soda, juice, and other mixers may contain a lot of sugar and must be counted as carbs. What are tips for  following this plan?  Reading food labels Start by checking the serving size on the Nutrition Facts label of packaged foods and drinks. The number of calories and the amount of carbs, fats, and other nutrients listed on the label are based on one serving of the item. Many items contain more than one serving per package. Check the total grams (g) of carbs in one serving. Check the number of grams of saturated fats and trans fats in one serving. Choose foods that have a low amount or none of these fats. Check the number of milligrams (mg) of salt (sodium) in one serving. Most people should limit total sodium intake to less than 2,300 mg per day. Always check the nutrition information of foods labeled as "low-fat" or "nonfat." These foods may be higher in added sugar or refined carbs and should be avoided. Talk to your dietitian to identify your daily goals for nutrients listed on the label. Shopping Avoid buying canned, pre-made, or processed foods. These foods tend to be high in fat, sodium, and added sugar. Shop around the outside edge of the grocery store. This is where you will most often find fresh fruits and vegetables, bulk grains, fresh meats, and fresh dairy products. Cooking Use low-heat cooking methods, such as baking, instead of high-heat cooking methods, such as deep frying. Cook using healthy oils, such as olive, canola, or sunflower oil. Avoid cooking with butter, cream, or high-fat meats. Meal planning Eat meals and snacks regularly, preferably at the same times every day. Avoid going long periods of time without eating. Eat foods that are high in fiber, such as fresh fruits, vegetables, beans, and whole grains. Eat 4-6 oz (112-168 g) of lean protein  each day, such as lean meat, chicken, fish, eggs, or tofu. One ounce (oz) (28 g) of lean protein is equal to: 1 oz (28 g) of meat, chicken, or fish. 1 egg.  cup (62 g) of tofu. Eat some foods each day that contain healthy fats, such as avocado, nuts, seeds, and fish. What foods should I eat? Fruits Berries. Apples. Oranges. Peaches. Apricots. Plums. Grapes. Mangoes. Papayas. Pomegranates. Kiwi. Cherries. Vegetables Leafy greens, including lettuce, spinach, kale, chard, collard greens, mustard greens, and cabbage. Beets. Cauliflower. Broccoli. Carrots. Green beans. Tomatoes. Peppers. Onions. Cucumbers. Brussels sprouts. Grains Whole grains, such as whole-wheat or whole-grain bread, crackers, tortillas, cereal, and pasta. Unsweetened oatmeal. Quinoa. Brown or wild rice. Meats and other proteins Seafood. Poultry without skin. Lean cuts of poultry and beef. Tofu. Nuts. Seeds. Dairy Low-fat or fat-free dairy products such as milk, yogurt, and cheese. The items listed above may not be a complete list of foods and beverages you can eat and drink. Contact a dietitian for more information. What foods should I avoid? Fruits Fruits canned with syrup. Vegetables Canned vegetables. Frozen vegetables with butter or cream sauce. Grains Refined white flour and flour products such as bread, pasta, snack foods, and cereals. Avoid all processed foods. Meats and other proteins Fatty cuts of meat. Poultry with skin. Breaded or fried meats. Processed meat. Avoid saturated fats. Dairy Full-fat yogurt, cheese, or milk. Beverages Sweetened drinks, such as soda or iced tea. The items listed above may not be a complete list of foods and beverages you should avoid. Contact a dietitian for more information. Questions to ask a health care provider Do I need to meet with a certified diabetes care and education specialist? Do I need to meet with a dietitian? What number can I call if I have  questions? When are  the best times to check my blood glucose? Where to find more information: American Diabetes Association: diabetes.org Academy of Nutrition and Dietetics: eatright.[employer] of Diabetes and Digestive and Kidney Diseases: [employer] Association of Diabetes Care & Education Specialists: diabeteseducator.org Summary It is important to have healthy eating habits because your blood sugar (glucose) levels are greatly affected by what you eat and drink. It is important to use alcohol carefully. A healthy meal plan will help you manage your blood glucose and lower your risk of heart disease. Your health care provider may recommend that you work with a dietitian to make a meal plan that is best for you. This information is not intended to replace advice given to you by your health care provider. Make sure you discuss any questions you have with your health care provider. Document Revised: 10/12/2019 Document Reviewed: 10/12/2019 Elsevier Patient Education  [place], MD [facility]

## 2022-03-06 NOTE — Patient Instructions (Signed)

## 2022-03-06 NOTE — Assessment & Plan Note (Signed)
Stable.  Diet and nutrition discussed.  Continue rosuvastatin 20 mg daily.  

## 2022-03-06 NOTE — Assessment & Plan Note (Signed)
Stable.  On CPAP treatment. 

## 2022-03-06 NOTE — Assessment & Plan Note (Signed)
Well-controlled hypertension. Continue Micardis HCTZ 80-12.5 mg daily BP Readings from Last 3 Encounters:  03/06/22 122/70  07/03/21 121/83  04/25/21 128/82  Uncontrolled diabetes with hemoglobin A1c at 10.9. Cardiovascular risks associated with uncontrolled diabetes discussed. Diet and nutrition discussed. Patient has appointment to see endocrinologist next week. I will defer medication changes to him. For now continue Tresiba 40 units daily, Jardiance 25 mg daily, Victoza 1 point milligram daily.

## 2022-03-07 ENCOUNTER — Other Ambulatory Visit: Payer: 59

## 2022-03-07 LAB — COMPREHENSIVE METABOLIC PANEL
ALT: 48 U/L (ref 0–53)
AST: 27 U/L (ref 0–37)
Albumin: 4.1 g/dL (ref 3.5–5.2)
Alkaline Phosphatase: 95 U/L (ref 39–117)
BUN: 25 mg/dL — ABNORMAL HIGH (ref 6–23)
CO2: 31 mEq/L (ref 19–32)
Calcium: 9.7 mg/dL (ref 8.4–10.5)
Chloride: 101 mEq/L (ref 96–112)
Creatinine, Ser: 1.28 mg/dL (ref 0.40–1.50)
GFR: 70.88 mL/min (ref >60.00)
Glucose, Bld: 307 mg/dL — ABNORMAL HIGH (ref 70–99)
Potassium: 3.9 mEq/L (ref 3.5–5.1)
Sodium: 140 mEq/L (ref 135–145)
Total Bilirubin: 0.3 mg/dL (ref 0.2–1.2)
Total Protein: 7 g/dL (ref 6.0–8.3)

## 2022-03-07 LAB — CBC WITH DIFFERENTIAL/PLATELET
Basophils Absolute: 0 10*3/uL (ref 0.0–0.1)
Basophils Relative: 0.7 % (ref 0.0–3.0)
Eosinophils Absolute: 0.1 10*3/uL (ref 0.0–0.7)
Eosinophils Relative: 1.7 % (ref 0.0–5.0)
HCT: 39 % (ref 39.0–52.0)
Hemoglobin: 12.5 g/dL — ABNORMAL LOW (ref 13.0–17.0)
Lymphocytes Relative: 45.8 % (ref 12.0–46.0)
Lymphs Abs: 3.1 10*3/uL (ref 0.7–4.0)
MCHC: 32.1 g/dL (ref 30.0–36.0)
MCV: 76.5 fl — ABNORMAL LOW (ref 78.0–100.0)
Monocytes Absolute: 0.7 10*3/uL (ref 0.1–1.0)
Monocytes Relative: 9.6 % (ref 3.0–12.0)
Neutro Abs: 2.9 10*3/uL (ref 1.4–7.7)
Neutrophils Relative %: 42.2 % — ABNORMAL LOW (ref 43.0–77.0)
Platelets: 309 10*3/uL (ref 150.0–400.0)
RBC: 5.11 Mil/uL (ref 4.22–5.81)
RDW: 14 % (ref 11.5–15.5)
WBC: 6.8 10*3/uL (ref 4.0–10.5)

## 2022-03-07 LAB — MICROALBUMIN / CREATININE URINE RATIO
Creatinine,U: 45 mg/dL
Microalb Creat Ratio: 1.6 mg/g (ref 0.0–30.0)
Microalb, Ur: <0.7 mg/dL (ref 0.0–1.9)

## 2022-03-07 LAB — LIPID PANEL
Cholesterol: 156 mg/dL (ref 0–200)
HDL: 35.8 mg/dL — ABNORMAL LOW (ref >39.00)
LDL Cholesterol: 83 mg/dL (ref 0–99)
NonHDL: 120.34
Total CHOL/HDL Ratio: 4
Triglycerides: 185 mg/dL — ABNORMAL HIGH (ref 0.0–149.0)
VLDL: 37 mg/dL (ref 0.0–40.0)

## 2022-03-09 ENCOUNTER — Other Ambulatory Visit: Payer: Self-pay | Admitting: Internal Medicine

## 2022-03-26 ENCOUNTER — Other Ambulatory Visit: Payer: Self-pay | Admitting: Internal Medicine

## 2022-04-15 LAB — HM DIABETES EYE EXAM

## 2022-04-17 ENCOUNTER — Telehealth: Payer: Self-pay | Admitting: Emergency Medicine

## 2022-04-17 ENCOUNTER — Other Ambulatory Visit (HOSPITAL_COMMUNITY): Payer: Self-pay

## 2022-04-17 ENCOUNTER — Other Ambulatory Visit: Payer: Self-pay | Admitting: Emergency Medicine

## 2022-04-17 NOTE — Telephone Encounter (Signed)
Caller & Relationship to patient:  self   Call back [phone]   Date of last office visit:  03/06/2022   Date of next office visit: 06/05/2022   Medication(s) to be refilled:  jardiance        Preferred Pharmacy:   [facility] at [facility]

## 2022-04-17 NOTE — Telephone Encounter (Signed)
Called patient and informed patient that this medication was prescribed by endocrinology. Patient will call to get this medication refilled.

## 2022-06-05 ENCOUNTER — Ambulatory Visit: Payer: 59 | Admitting: Emergency Medicine

## 2022-07-07 ENCOUNTER — Ambulatory Visit (INDEPENDENT_AMBULATORY_CARE_PROVIDER_SITE_OTHER): Payer: 59 | Admitting: Neurology

## 2022-07-07 ENCOUNTER — Encounter: Payer: Self-pay | Admitting: Neurology

## 2022-07-07 VITALS — BP 140/91 | HR 95 | Ht 68.0 in | Wt 243.0 lb

## 2022-07-07 DIAGNOSIS — Z794 Long term (current) use of insulin: Secondary | ICD-10-CM | POA: Diagnosis not present

## 2022-07-07 DIAGNOSIS — G4733 Obstructive sleep apnea (adult) (pediatric): Secondary | ICD-10-CM | POA: Diagnosis not present

## 2022-07-07 DIAGNOSIS — E1159 Type 2 diabetes mellitus with other circulatory complications: Secondary | ICD-10-CM | POA: Diagnosis not present

## 2022-07-07 DIAGNOSIS — E113413 Type 2 diabetes mellitus with severe nonproliferative diabetic retinopathy with macular edema, bilateral: Secondary | ICD-10-CM

## 2022-07-07 DIAGNOSIS — I152 Hypertension secondary to endocrine disorders: Secondary | ICD-10-CM

## 2022-07-07 MED ORDER — TRIAMCINOLONE ACETONIDE 55 MCG/ACT NA AERO: 2.0000 | INHALATION_SPRAY | Freq: Every day | NASAL | 12 refills | Status: AC

## 2022-07-07 NOTE — Progress Notes (Signed)
Provider:  Melvyn Novas, MD  Primary Care Physician:  Georgina Quint, MD [address] [place] [address]     Referring Provider: Edwina Barth [place], [place] [address] [place],  [place] [address]          Chief Complaint according to patient   Patient presents with:     New Patient (Initial Visit)           HISTORY OF PRESENT ILLNESS:  Aaron Woods is a 37 y.o. male patient who is here for revisit 07/07/2022 for  CPAP compliance, he had meanwhile contracted COVID and several other viral infections, with URI.  Marland Kitchen  Chief concern according to patient :   He reports having trouble with nasal congestion and today also with a tooth ache.  The patient's compliance has suffered under these conditions, he has used the machine 24 out of 30 days but only 10 of these days for over 4 hours of consecutive use.  There are however other nights where he used to 12 hours.  This is an outer titration CPAP device not a ResMed device but a G3 out of CPAP.  The minimum pressure is 6 the maximum pressure 18 cm water ramping starts at 4 cm water.  The 95th percentile pressure is 10.8 cm water the average leak is 12 L a minute, he is much happier with the nasal mask than he was with his fullface mask.  Residual AHI is excellent at 2.8/h down from a severe degree as documented in his home sleep test.  I will order an additional nasal spray for the patient to help him with his compliance and I would like for him to call the office in 30 days to get a new download for [place] printed out reviewed and added to his electronic health record with hopefully better compliance.        Review of Systems: Out of a complete 14 system review, the patient complains of only the following symptoms, and all other reviewed systems are negative.:  Fatigue, sleepiness , snoring, fragmented sleep,circadian rhythm.   How likely are you to doze in the following situations: 0 = not likely, 1 = slight chance, 2 =  moderate chance, 3 = high chance   Sitting and Reading? Watching Television? Sitting inactive in a public place (theater or meeting)? As a passenger in a car for an hour without a break? Lying down in the afternoon when circumstances permit? Sitting and talking to someone? Sitting quietly after lunch without alcohol? In a car, while stopped for a few minutes in traffic?   Total = 5/ 24 points   FSS endorsed at 33/ 63 points.   Social History   Socioeconomic History   Marital status: Married    Spouse name: [name]   Number of children: 3   Years of education: Not on file   Highest education level: High school graduate  Occupational History   Not on file  Tobacco Use   Smoking status: Never   Smokeless tobacco: Never  Substance and Sexual Activity   Alcohol use: No   Drug use: No   Sexual activity: Not on file  Other Topics Concern   Not on file  Social History Narrative   Live at home with wife and 3 kids   Right handed   Caffeine: 1 cup of tea in the AM   Social Determinants of Health   Financial Resource Strain: Not  on file  Food Insecurity: Not on file  Transportation Needs: Not on file  Physical Activity: Not on file  Stress: Not on file  Social Connections: Not on file    Family History  Problem Relation Age of Onset   Diabetes Mother    Diabetes Brother    Diabetes Brother    Diabetes Brother    Diabetes Maternal Grandmother     Past Medical History:  Diagnosis Date   Borderline high blood pressure    Diabetes 1.5, managed as type 2    Diabetes mellitus     History reviewed. No pertinent surgical history.   Current Outpatient Medications on File Prior to Visit  Medication Sig Dispense Refill   acetaminophen (TYLENOL) 325 MG tablet Take 650 mg by mouth every 6 (six) hours as needed for mild pain or headache.     BD PEN NEEDLE MICRO U/F 32G X 6 MM MISC USE 1  ONCE DAILY IN  AFTERNOON 100 each 0   blood glucose meter kit and supplies by Other  route as directed. Dispense based on patient and insurance preference. Use up to four times daily as directed. (FOR ICD-10 E10.9, E11.9).     empagliflozin (JARDIANCE) 25 MG TABS tablet Take 1 tablet (25 mg total) by mouth daily before breakfast. 90 tablet 2   glucose blood (ONETOUCH VERIO) test strip 1 each by Other route daily in the afternoon. Use as instructed 100 each 3   insulin degludec (TRESIBA FLEXTOUCH) 200 UNIT/ML FlexTouch Pen Inject 40 Units into the skin daily in the afternoon. 15 mL 6   Insulin Pen Needle (B-D UF III MINI PEN NEEDLES) 31G X 5 MM MISC USE AS DIRECTED 1-4 TIMES A DAILY 100 each 0   liraglutide (VICTOZA) 18 MG/3ML SOPN Inject 1.8 mg into the skin daily in the afternoon. 9 mL 11   rosuvastatin (CRESTOR) 20 MG tablet Take 1 tablet by mouth once daily 90 tablet 3   telmisartan-hydrochlorothiazide (MICARDIS HCT) 80-12.5 MG tablet Take 1 tablet by mouth once daily 90 tablet 0   No current facility-administered medications on file prior to visit.    Allergies  Allergen Reactions   Almond (Diagnostic) Itching     DIAGNOSTIC DATA (LABS, IMAGING, TESTING) - I reviewed patient records, labs, notes, testing and imaging myself where available.  "02-2021: This HST confirms the presence of severe sleep apnea and suggests longstanding and severe hypoxia to be present. We were unable to see positional data and also REM / NREM sleep breakdown. HR Varied between 53 and 120 bpm with a mean heart rate of 92 bpm which is an elevated resting heart rate.   RECOMMENDATION: Auto- titrating CPAP with a setting from 6 to 18 cm water pressure 3 cm EPR, heated humidification, mask of patient's choice.  Lab Results  Component Value Date   WBC 6.8 03/07/2022   HGB 12.5 (L) 03/07/2022   HCT 39.0 03/07/2022   MCV 76.5 (L) 03/07/2022   PLT 309.0 03/07/2022      Component Value Date/Time   NA 140 03/07/2022 0807   NA 138 11/02/2018 1702   K 3.9 03/07/2022 0807   CL 101 03/07/2022 0807    CO2 31 03/07/2022 0807   GLUCOSE 307 (H) 03/07/2022 0807   BUN 25 (H) 03/07/2022 0807   BUN 11 11/02/2018 1702   CREATININE 1.28 03/07/2022 0807   CALCIUM 9.7 03/07/2022 0807   PROT 7.0 03/07/2022 0807   PROT 6.7 11/02/2018 1702   ALBUMIN  4.1 03/07/2022 0807   ALBUMIN 4.2 11/02/2018 1702   AST 27 03/07/2022 0807   ALT 48 03/07/2022 0807   ALKPHOS 95 03/07/2022 0807   BILITOT 0.3 03/07/2022 0807   BILITOT 0.4 11/02/2018 1702   GFRNONAA >60 12/07/2019 0502   GFRAA >60 12/07/2019 0502   Lab Results  Component Value Date   CHOL 156 03/07/2022   HDL 35.80 (L) 03/07/2022   LDLCALC 83 03/07/2022   TRIG 185.0 (H) 03/07/2022   CHOLHDL 4 03/07/2022   Lab Results  Component Value Date   HGBA1C 10.9 (A) 03/06/2022   No results found for: "VITAMINB12" Lab Results  Component Value Date   TSH 1.76 04/25/2021    PHYSICAL EXAM:  Today's Vitals   07/07/22 0930  BP: (!) 140/91  Pulse: 95  Weight: 243 lb (110.2 kg)  Height: 5\' 8"  (1.727 m)   Body mass index is 36.95 kg/m.   Wt Readings from Last 3 Encounters:  07/07/22 243 lb (110.2 kg)  03/06/22 243 lb (110.2 kg)  07/12/21 235 lb (106.6 kg)     Ht Readings from Last 3 Encounters:  07/07/22 5\' 8"  (1.727 m)  03/06/22 5\' 9"  (1.753 m)  07/12/21 5\' 9"  (1.753 m)      General:  The patient is awake, alert and appears not in acute distress. The patient is well groomed. Head: Normocephalic, atraumatic. Neck is supple.    Mallampati 3,  neck circumference:18.5  inches . Nasal airflow barely patent.  Retrognathia is not seen. Facial hair.  Dental status: intact.  Cardiovascular:  Regular rate and cardiac rhythm by pulse,  without distended neck veins. Respiratory: Lungs are clear to auscultation.  Skin:  Without evidence of ankle edema, or rash. Trunk: The patient's posture is erect.   Neurologic exam : The patient is awake and alert, oriented to place and time.   Memory subjective described as intact.  Attention span &  concentration ability appears normal.  Speech is fluent,  without  dysarthria, dysphonia or aphasia.  Mood and affect are appropriate.   Cranial nerves: no loss of smell or taste reported  Pupils are equal and briskly reactive to light. Funduscopic exam deferred. .  Extraocular movements in vertical and horizontal planes were intact and without nystagmus. No Diplopia. Visual fields by finger perimetry are intact. Hearing was intact to soft voice and finger rubbing.    Facial sensation intact to fine touch.  Facial motor strength is symmetric and tongue and uvula move midline.  Neck ROM : rotation, tilt and flexion extension were normal for age and shoulder shrug was symmetrical.    Motor exam:  Symmetric bulk, tone and ROM.   Normal tone without cog- wheeling, symmetric grip strength .   Sensory:  Fine touch, pinprick and vibration were tested  and  normal.  Proprioception tested in the upper extremities was normal.   Coordination: Rapid alternating movements in the fingers/hands were of normal speed.  The Finger-to-nose maneuver was intact without evidence of ataxia, dysmetria or tremor.   Gait and station: Patient could rise unassisted from a seated position, walked without assistive device.  Stance is of normal width/ base and the patient turned with 3 steps.  Toe and heel walk were deferred.  Deep tendon reflexes: in the  upper and lower extremities were trace only- DTR in  patella absent - achilles tendon absent Babinski response was deferred.        After spending a total time of  25  minutes  face to face and additional time for physical and neurologic examination, review of laboratory studies,  personal review of imaging studies, reports and results of other testing and review of referral information / records as far as provided in visit, I have established the following assessments:      ASSESSMENT AND PLAN 37 y.o. year old male  here with:   1 )OSA severe, no data in  regards to snoring or positional AHI. Lots of motion artefact. Moderate-Severe hypoxia as well. CPAP at 10 cm water has reduced the AHI by 90%. He no longer has headaches in the morning. Auto set CPAP from 6-18 cm water    2)Improvement in formerly stated  excessive daytime sleepiness -reflected in an Epworth sleepiness score of 15 points,  now down to 5 points on CPAP.  Excessive daytime fatigue reflected in a fatigue severity score of 43 points was now 33 points. Marland Kitchen     3)  Likes a  nasal mask and use a chin strap.  Needs a nasal spray and he has allergies. Allergic rhinitis, sinusitis.    4) Nocturia  resolved -  now at 1-2 times  from 3-5 times at night - he meanwhile changed his diuretic to AM form PM intake time      I plan to follow up either personally or through our NP within 12 months.   I would like to thank Georgina Quint, MD and Edwina Barth [place], [place] [address] [place],  [place] [address] for allowing me to meet with and to take care of this pleasant patient.    After spending a total time of  15  minutes face to face and additional time for physical and neurologic examination, review of laboratory studies,  personal review of imaging studies, reports and results of other testing and review of referral information / records as far as provided in visit,   Electronically signed by: Melvyn Novas, MD 07/07/2022 10:04 AM  [facility] Board certified by The [employer] of Sleep Medicine and Diplomate of the [employer] of Sleep Medicine. Board certified In Neurology through the ABPN, Fellow of the [employer] of Neurology. Medical Director of [facility].

## 2022-07-07 NOTE — Patient Instructions (Signed)
Quality Sleep Information, Adult Quality sleep is important for your mental and physical health. It also improves your quality of life. Quality sleep means you: Are asleep for most of the time you are in bed. Fall asleep within 30 minutes. Wake up no more than once a night. Are awake for no longer than 20 minutes if you do wake up during the night. Most adults need 7-8 hours of quality sleep each night. How can poor sleep affect me? If you do not get enough quality sleep, you may have: Mood swings. Daytime sleepiness. Decreased alertness, reaction time, and concentration. Sleep disorders, such as insomnia and sleep apnea. Difficulty with: Solving problems. Coping with stress. Paying attention. These issues may affect your performance and productivity at work, school, and home. Lack of sleep may also put you at higher risk for accidents, suicide, and risky behaviors. If you do not get quality sleep, you may also be at higher risk for several health problems, including: Infections. Type 2 diabetes. Heart disease. High blood pressure. Obesity. Worsening of long-term conditions, like arthritis, kidney disease, depression, Parkinson's disease, and epilepsy. What actions can I take to get more quality sleep? Sleep schedule and routine Stick to a sleep schedule. Go to sleep and wake up at about the same time each day. Do not try to sleep less on weekdays and make up for lost sleep on weekends. This does not work. Limit naps during the day to 30 minutes or less. Do not take naps in the late afternoon. Make time to relax before bed. Reading, listening to music, or taking a hot bath promotes quality sleep. Make your bedroom a place that promotes quality sleep. Keep your bedroom dark, quiet, and at a comfortable room temperature. Make sure your bed is comfortable. Avoid using electronic devices that give off bright blue light for 30 minutes before bedtime. Your brain perceives bright blue light  as sunlight. This includes television, phones, and computers. If you are lying awake in bed for longer than 20 minutes, get up and do a relaxing activity until you feel sleepy. Lifestyle     Try to get at least 30 minutes of exercise on most days. Do not exercise 2-3 hours before going to bed. Do not use any products that contain nicotine or tobacco. These products include cigarettes, chewing tobacco, and vaping devices, such as e-cigarettes. If you need help quitting, ask your health care provider. Do not drink caffeinated beverages for at least 8 hours before going to bed. Coffee, tea, and some sodas contain caffeine. Do not drink alcohol or eat large meals close to bedtime. Try to get at least 30 minutes of sunlight every day. Morning sunlight is best. Medical concerns Work with your health care provider to treat medical conditions that may affect sleeping, such as: Nasal obstruction. Snoring. Sleep apnea and other sleep disorders. Talk to your health care provider if you think any of your prescription medicines may cause you to have difficulty falling or staying asleep. If you have sleep problems, talk with a sleep consultant. If you think you have a sleep disorder, talk with your health care provider about getting evaluated by a specialist. Where to find more information Sleep Foundation: sleepfoundation.org American Academy of Sleep Medicine: aasm.org Centers for Disease Control and Prevention (CDC): StoreMirror.com.cy Contact a health care provider if: You have trouble getting to sleep or staying asleep. You often wake up very early in the morning and cannot get back to sleep. You have daytime sleepiness. You  have daytime sleep attacks of suddenly falling asleep and sudden muscle weakness (narcolepsy). You have a tingling sensation in your legs with a strong urge to move your legs (restless legs syndrome). You stop breathing briefly during sleep (sleep apnea). You think you have a sleep  disorder or are taking a medicine that is affecting your quality of sleep. Summary Most adults need 7-8 hours of quality sleep each night. Getting enough quality sleep is important for your mental and physical health. Make your bedroom a place that promotes quality sleep, and avoid things that may cause you to have poor sleep, such as alcohol, caffeine, smoking, or large meals. Talk to your health care provider if you have trouble falling asleep or staying asleep. This information is not intended to replace advice given to you by your health care provider. Make sure you discuss any questions you have with your health care provider. Document Revised: 07/03/2021 Document Reviewed: 07/03/2021 Elsevier Patient Education  2023 Elsevier Inc.   CPAP and BIPAP Information CPAP and BIPAP are methods that use air pressure to keep your airways open and to help you breathe well. CPAP and BIPAP use different amounts of pressure. Your health care provider will tell you whether CPAP or BIPAP would be more helpful for you. CPAP stands for "continuous positive airway pressure." With CPAP, the amount of pressure stays the same while you breathe in (inhale) and out (exhale). BIPAP stands for "bi-level positive airway pressure." With BIPAP, the amount of pressure will be higher when you inhale and lower when you exhale. This allows you to take larger breaths. CPAP or BIPAP may be used in the hospital, or your health care provider may want you to use it at home. You may need to have a sleep study before your health care provider can order a machine for you to use at home. What are the advantages? CPAP or BIPAP can be helpful if you have: Sleep apnea. Chronic obstructive pulmonary disease (COPD). Heart failure. Medical conditions that cause muscle weakness, including muscular dystrophy or amyotrophic lateral sclerosis (ALS). Other problems that cause breathing to be shallow, weak, abnormal, or difficult. CPAP and  BIPAP are most commonly used for obstructive sleep apnea (OSA) to keep the airways from collapsing when the muscles relax during sleep. What are the risks? Generally, this is a safe treatment. However, problems may occur, including: Irritated skin or skin sores if the mask does not fit properly. Dry or stuffy nose or nosebleeds. Dry mouth. Feeling gassy or bloated. Sinus or lung infection if the equipment is not cleaned properly. When should CPAP or BIPAP be used? In most cases, the mask only needs to be worn during sleep. Generally, the mask needs to be worn throughout the night and during any daytime naps. People with certain medical conditions may also need to wear the mask at other times, such as when they are awake. Follow instructions from your health care provider about when to use the machine. What happens during CPAP or BIPAP?  Both CPAP and BIPAP are provided by a small machine with a flexible plastic tube that attaches to a plastic mask that you wear. Air is blown through the mask into your nose or mouth. The amount of pressure that is used to blow the air can be adjusted on the machine. Your health care provider will set the pressure setting and help you find the best mask for you. Tips for using the mask Because the mask needs to be snug, some  people feel trapped or closed-in (claustrophobic) when first using the mask. If you feel this way, you may need to get used to the mask. One way to do this is to hold the mask loosely over your nose or mouth and then gradually apply the mask more snugly. You can also gradually increase the amount of time that you use the mask. Masks are available in various types and sizes. If your mask does not fit well, talk with your health care provider about getting a different one. Some common types of masks include: Full face masks, which fit over the mouth and nose. Nasal masks, which fit over the nose. Nasal pillow or prong masks, which fit into the  nostrils. If you are using a mask that fits over your nose and you tend to breathe through your mouth, a chin strap may be applied to help keep your mouth closed. Use a skin barrier to protect your skin as told by your health care provider. Some CPAP and BIPAP machines have alarms that may sound if the mask comes off or develops a leak. If you have trouble with the mask, it is very important that you talk with your health care provider about finding a way to make the mask easier to tolerate. Do not stop using the mask. There could be a negative impact on your health if you stop using the mask. Tips for using the machine Place your CPAP or BIPAP machine on a secure table or stand near an electrical outlet. Know where the on/off switch is on the machine. Follow instructions from your health care provider about how to set the pressure on your machine and when you should use it. Do not eat or drink while the CPAP or BIPAP machine is on. Food or fluids could get pushed into your lungs by the pressure of the CPAP or BIPAP. For home use, CPAP and BIPAP machines can be rented or purchased through home health care companies. Many different brands of machines are available. Renting a machine before purchasing may help you find out which particular machine works well for you. Your health insurance company may also decide which machine you may get. Keep the CPAP or BIPAP machine and attachments clean. Ask your health care provider for specific instructions. Check the humidifier if you have a dry stuffy nose or nosebleeds. Make sure it is working correctly. Follow these instructions at home: Take over-the-counter and prescription medicines only as told by your health care provider. Ask if you can take sinus medicine if your sinuses are blocked. Do not use any products that contain nicotine or tobacco. These products include cigarettes, chewing tobacco, and vaping devices, such as e-cigarettes. If you need help  quitting, ask your health care provider. Keep all follow-up visits. This is important. Contact a health care provider if: You have redness or pressure sores on your head, face, mouth, or nose from the mask or head gear. You have trouble using the CPAP or BIPAP machine. You cannot tolerate wearing the CPAP or BIPAP mask. Someone tells you that you snore even when wearing your CPAP or BIPAP. Get help right away if: You have trouble breathing. You feel confused. Summary CPAP and BIPAP are methods that use air pressure to keep your airways open and to help you breathe well. If you have trouble with the mask, it is very important that you talk with your health care provider about finding a way to make the mask easier to tolerate. Do not  stop using the mask. There could be a negative impact to your health if you stop using the mask. Follow instructions from your health care provider about when to use the machine. This information is not intended to replace advice given to you by your health care provider. Make sure you discuss any questions you have with your health care provider. Document Revised: 10/17/2020 Document Reviewed: 02/17/2020 Elsevier Patient Education  2023 [employer].

## 2022-07-08 ENCOUNTER — Telehealth: Payer: Self-pay | Admitting: Emergency Medicine

## 2022-07-08 DIAGNOSIS — Z794 Long term (current) use of insulin: Secondary | ICD-10-CM

## 2022-07-08 MED ORDER — ONETOUCH VERIO W/DEVICE KIT: PACK | 0 refills | Status: DC

## 2022-07-08 MED ORDER — ONETOUCH DELICA LANCETS 30G MISC: 3 refills | Status: DC

## 2022-07-08 MED ORDER — ONETOUCH VERIO VI STRP: 1.0000 | ORAL_STRIP | Freq: Every day | 3 refills | Status: DC

## 2022-07-08 NOTE — Telephone Encounter (Signed)
Sent BS Monitors w/ supplies to [facility].Marland KitchenRaechel Chute

## 2022-07-08 NOTE — Telephone Encounter (Signed)
New prescription for meter and strip sent to patient pharmacy

## 2022-07-08 NOTE — Telephone Encounter (Signed)
Patient would like a new glucometer, test strips and lancets called into [facility] [employer].

## 2022-07-10 ENCOUNTER — Ambulatory Visit (INDEPENDENT_AMBULATORY_CARE_PROVIDER_SITE_OTHER): Payer: 59 | Admitting: Emergency Medicine

## 2022-07-10 ENCOUNTER — Encounter: Payer: Self-pay | Admitting: Emergency Medicine

## 2022-07-10 VITALS — BP 134/94 | HR 85 | Temp 98.5°F | Ht 68.0 in | Wt 244.0 lb

## 2022-07-10 DIAGNOSIS — I152 Hypertension secondary to endocrine disorders: Secondary | ICD-10-CM | POA: Diagnosis not present

## 2022-07-10 DIAGNOSIS — E113413 Type 2 diabetes mellitus with severe nonproliferative diabetic retinopathy with macular edema, bilateral: Secondary | ICD-10-CM

## 2022-07-10 DIAGNOSIS — Z794 Long term (current) use of insulin: Secondary | ICD-10-CM | POA: Diagnosis not present

## 2022-07-10 DIAGNOSIS — E785 Hyperlipidemia, unspecified: Secondary | ICD-10-CM

## 2022-07-10 DIAGNOSIS — E1169 Type 2 diabetes mellitus with other specified complication: Secondary | ICD-10-CM

## 2022-07-10 DIAGNOSIS — E1159 Type 2 diabetes mellitus with other circulatory complications: Secondary | ICD-10-CM

## 2022-07-10 LAB — POCT GLYCOSYLATED HEMOGLOBIN (HGB A1C): Hemoglobin A1C: 9.4 % — AB (ref 4.0–5.6)

## 2022-07-10 NOTE — Patient Instructions (Signed)

## 2022-07-10 NOTE — Assessment & Plan Note (Signed)
Well-controlled hypertension Continue Micardis HCTZ 80-12.5 mg daily Hemoglobin A1c still not at goal but better than before at 9.4 Scheduled to see endocrinologist in 2 weeks Will defer medication changes to her. For now remain on Jardiance 25 mg daily, Tresiba 40 units daily, and Victoza 1.8 mg daily Diet and nutrition discussed Cardiovascular risks associated with hypertension and diabetes discussed

## 2022-07-10 NOTE — Assessment & Plan Note (Signed)
Chronic stable condition. Continue rosuvastatin 20 mg daily.  

## 2022-07-10 NOTE — Progress Notes (Signed)
Aaron Woods 37 y.o.   Chief Complaint  Patient presents with   Medical Management of Chronic Issues    3 month f/u for DM. No other concerns    HISTORY OF PRESENT ILLNESS: This is a 37 y.o. male here for 25-month follow-up of diabetes and hypertension Scheduled to see endocrinologist on May 1st. Overall doing well.  Has no complaints or any other medical concerns today.  HPI   Prior to Admission medications   Medication Sig Start Date End Date Taking? Authorizing Provider  acetaminophen (TYLENOL) 325 MG tablet Take 650 mg by mouth every 6 (six) hours as needed for mild pain or headache.   Yes [provider]  BD PEN NEEDLE MICRO U/F 32G X 6 MM MISC USE 1  ONCE DAILY IN  AFTERNOON 11/11/21  Yes Shamleffer, Konrad Dolores, MD  blood glucose meter kit and supplies by Other route as directed. Dispense based on patient and insurance preference. Use up to four times daily as directed. (FOR ICD-10 E10.9, E11.9).   Yes [provider]  Blood Glucose Monitoring Suppl (ONETOUCH VERIO) w/Device KIT Use to check blood glucose daily in the afternoon. Dx E11.9 07/08/22  Yes Georgina Quint, MD  empagliflozin (JARDIANCE) 25 MG TABS tablet Take 1 tablet (25 mg total) by mouth daily before breakfast. 02/28/21  Yes Shamleffer, Konrad Dolores, MD  glucose blood (ONETOUCH VERIO) test strip 1 each by Other route daily in the afternoon. Dx E11.9 07/08/22  Yes Lada Fulbright, Eilleen Kempf, MD  insulin degludec (TRESIBA FLEXTOUCH) 200 UNIT/ML FlexTouch Pen Inject 40 Units into the skin daily in the afternoon. 03/04/22  Yes Champion Corales, Eilleen Kempf, MD  Insulin Pen Needle (B-D UF III MINI PEN NEEDLES) 31G X 5 MM MISC USE AS DIRECTED 1-4 TIMES A DAILY 11/11/21  Yes Shamleffer, Konrad Dolores, MD  liraglutide (VICTOZA) 18 MG/3ML SOPN Inject 1.8 mg into the skin daily in the afternoon. 04/17/21  Yes Shamleffer, Konrad Dolores, MD  OneTouch Delica Lancets 30G MISC Use to check blood sugars daily  07/08/22  Yes Ferry Matthis, Eilleen Kempf, MD  rosuvastatin (CRESTOR) 20 MG tablet Take 1 tablet by mouth once daily 04/17/22  Yes Kyli Sorter, Eilleen Kempf, MD  telmisartan-hydrochlorothiazide (MICARDIS HCT) 80-12.5 MG tablet Take 1 tablet by mouth once daily 03/01/22  Yes Nguyet Mercer, Eilleen Kempf, MD  triamcinolone (NASACORT) 55 MCG/ACT AERO nasal inhaler Place 2 sprays into the nose daily. 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  Social History   Socioeconomic History   Marital status: Married    Spouse name: [name]   Number of children: 3   Years of education: Not on file   Highest education level: High school graduate  Occupational History   Not on file  Tobacco Use   Smoking status: Never  Smokeless tobacco: Never  Substance and Sexual Activity   Alcohol use: No   Drug use: No   Sexual activity: Not on file  Other Topics Concern   Not on file  Social History Narrative   Live at home with wife and 3 kids   Right handed   Caffeine: 1 cup of tea in the AM   Social Determinants of Health   Financial Resource Strain: Not on file  Food Insecurity: Not on file  Transportation Needs: Not on file  Physical Activity: Not on file  Stress: Not on file  Social Connections: Not on file  Intimate Partner Violence: Not  on file    Family History  Problem Relation Age of Onset   Diabetes Mother    Diabetes Brother    Diabetes Brother    Diabetes Brother    Diabetes Maternal Grandmother      Review of Systems  Constitutional: Negative.  Negative for chills and fever.  HENT: Negative.  Negative for congestion and sore throat.   Respiratory: Negative.  Negative for cough and shortness of breath.   Cardiovascular: Negative.  Negative for chest pain and palpitations.  Gastrointestinal:  Negative for abdominal pain, diarrhea, nausea and vomiting.  Genitourinary: Negative.  Negative for dysuria and hematuria.  Skin: Negative.  Negative for rash.  Neurological: Negative.  Negative for dizziness and headaches.  All other systems reviewed and are negative.   Today's Vitals   07/10/22 1612  BP: (!) 134/94  Pulse: 85  Temp: 98.5 F (36.9 C)  TempSrc: Oral  SpO2: 95%  Weight: 244 lb (110.7 kg)  Height: 5\' 8"  (1.727 m)   Body mass index is 37.1 kg/m.   Physical Exam Vitals reviewed.  Constitutional:      Appearance: Normal appearance.  HENT:     Head: Normocephalic.  Eyes:     Extraocular Movements: Extraocular movements intact.  Cardiovascular:     Rate and Rhythm: Normal rate.     Heart sounds: Normal heart sounds.  Pulmonary:     Effort: Pulmonary effort is normal.  Skin:    General: Skin is warm and dry.  Neurological:     Mental Status: He is alert and oriented to person, place, and time.  Psychiatric:        Mood and Affect: Mood normal.        Behavior: Behavior normal.    Results for orders placed or performed in visit on 07/10/22 (from the past 24 hour(s))  POCT HgB A1C     Status: Abnormal   Collection Time: 07/10/22  4:26 PM  Result Value Ref Range   Hemoglobin A1C 9.4 (A) 4.0 - 5.6 %   HbA1c POC (<> result, manual entry)     HbA1c, POC (prediabetic range)     HbA1c, POC (controlled diabetic range)       ASSESSMENT & PLAN: A total of 43 minutes was spent with the  patient and counseling/coordination of care regarding preparing for this visit, review of most recent office visit notes, review of most recent blood work results including interpretation of today's hemoglobin A1c, cardiovascular risk associated with hypertension and diabetes, education on nutrition, review of all medications, prognosis, documentation and need for follow-up.  Problem List Items Addressed This Visit       Cardiovascular and Mediastinum   Hypertension associated with diabetes - Primary    Well-controlled hypertension Continue Micardis HCTZ 80-12.5 mg daily Hemoglobin A1c still not at goal but better than before at  9.4 Scheduled to see endocrinologist in 2 weeks Will defer medication changes to her. 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 Eating healthy foods in the right amounts, at about the same times every day, can help you: Manage your blood glucose. Lower your risk of heart disease. Improve your blood pressure. Reach or maintain a healthy weight. What can affect my meal plan? Every person with diabetes is different, and each person has different needs for a meal plan. Your health care provider may recommend that you work with a dietitian to  make a meal plan that is best for you. Your meal plan may vary depending on factors such as: The calories you need. The medicines you take. Your weight. Your blood glucose, blood pressure, and cholesterol levels. Your activity level. Other health conditions you have, such as heart or kidney disease. How do carbohydrates affect me? Carbohydrates, also called carbs, affect your blood glucose level more than any other type of food. Eating carbs raises the amount of glucose in your blood. It is important to know how many carbs you can safely have in each meal. This is different for every person. Your dietitian can help you calculate how many carbs you should have at each meal and for each snack. How does alcohol affect me? Alcohol can cause a decrease in blood glucose (hypoglycemia), especially if you use insulin or take certain diabetes medicines by mouth. Hypoglycemia can be a life-threatening condition. Symptoms of hypoglycemia, such as sleepiness, dizziness, and confusion, are similar to symptoms of having too much alcohol. Do not drink alcohol if: Your health care provider tells you not to drink. You are pregnant, may be pregnant, or are planning to become pregnant. If you drink alcohol: Limit how much you have to: 0-1 drink a day for women. 0-2 drinks a day for men. Know how much alcohol is in your drink. In the [place] one drink equals one 12 oz bottle of beer (355 mL), one 5 oz glass of wine (148 mL), or one 1 oz glass of hard liquor (44 mL). Keep yourself hydrated with water, diet soda, or unsweetened iced tea. Keep in mind that regular soda, juice, and other mixers may contain a lot of sugar and must be counted as carbs. What are tips for following this plan?  Reading food labels Start by checking the serving size on the Nutrition Facts label of packaged foods and drinks. The number of calories and the amount of carbs, fats, and other nutrients listed on the label are based on one  serving of the item. Many items contain more than one serving per package. Check the total grams (g) of carbs in one serving. Check the number of grams of saturated fats and trans fats in one  serving. Choose foods that have a low amount or none of these fats. Check the number of milligrams (mg) of salt (sodium) in one serving. Most people should limit total sodium intake to less than 2,300 mg per day. Always check the nutrition information of foods labeled as "low-fat" or "nonfat." These foods may be higher in added sugar or refined carbs and should be avoided. Talk to your dietitian to identify your daily goals for nutrients listed on the label. Shopping Avoid buying canned, pre-made, or processed foods. These foods tend to be high in fat, sodium, and added sugar. Shop around the outside edge of the grocery store. This is where you will most often find fresh fruits and vegetables, bulk grains, fresh meats, and fresh dairy products. Cooking Use low-heat cooking methods, such as baking, instead of high-heat cooking methods, such as deep frying. Cook using healthy oils, such as olive, canola, or sunflower oil. Avoid cooking with butter, cream, or high-fat meats. Meal planning Eat meals and snacks regularly, preferably at the same times every day. Avoid going long periods of time without eating. Eat foods that are high in fiber, such as fresh fruits, vegetables, beans, and whole grains. Eat 4-6 oz (112-168 g) of lean protein each day, such as lean meat, chicken, fish, eggs, or tofu. One ounce (oz) (28 g) of lean protein is equal to: 1 oz (28 g) of meat, chicken, or fish. 1 egg.  cup (62 g) of tofu. Eat some foods each day that contain healthy fats, such as avocado, nuts, seeds, and fish. What foods should I eat? Fruits Berries. Apples. Oranges. Peaches. Apricots. Plums. Grapes. Mangoes. Papayas. Pomegranates. Kiwi. Cherries. Vegetables Leafy greens, including lettuce, spinach, kale, chard,  collard greens, mustard greens, and cabbage. Beets. Cauliflower. Broccoli. Carrots. Green beans. Tomatoes. Peppers. Onions. Cucumbers. Brussels sprouts. Grains Whole grains, such as whole-wheat or whole-grain bread, crackers, tortillas, cereal, and pasta. Unsweetened oatmeal. Quinoa. Brown or wild rice. Meats and other proteins Seafood. Poultry without skin. Lean cuts of poultry and beef. Tofu. Nuts. Seeds. Dairy Low-fat or fat-free dairy products such as milk, yogurt, and cheese. The items listed above may not be a complete list of foods and beverages you can eat and drink. Contact a dietitian for more information. What foods should I avoid? Fruits Fruits canned with syrup. Vegetables Canned vegetables. Frozen vegetables with butter or cream sauce. Grains Refined white flour and flour products such as bread, pasta, snack foods, and cereals. Avoid all processed foods. Meats and other proteins Fatty cuts of meat. Poultry with skin. Breaded or fried meats. Processed meat. Avoid saturated fats. Dairy Full-fat yogurt, cheese, or milk. Beverages Sweetened drinks, such as soda or iced tea. The items listed above may not be a complete list of foods and beverages you should avoid. Contact a dietitian for more information. Questions to ask a health care provider Do I need to meet with a certified diabetes care and education specialist? Do I need to meet with a dietitian? What number can I call if I have questions? When are the best times to check my blood glucose? Where to find more information: American Diabetes Association: diabetes.org Academy of Nutrition and Dietetics: eatright.[employer] of Diabetes and Digestive and Kidney Diseases: StageSync.si Association of Diabetes Care & Education Specialists: diabeteseducator.org Summary It is important to have healthy eating habits because your blood sugar (glucose) levels are greatly affected by what you eat and drink. It is  important to use alcohol carefully. A healthy meal plan  will help you manage your blood glucose and lower your risk of heart disease. Your health care provider may recommend that you work with a dietitian to make a meal plan that is best for you. This information is not intended to replace advice given to you by your health care provider. Make sure you discuss any questions you have with your health care provider. Document Revised: 10/12/2019 Document Reviewed: 10/12/2019 Elsevier Patient Education  2023 Elsevier Inc.     Edwina Barth, MD [facility]

## 2022-07-11 LAB — HM DIABETES EYE EXAM

## 2022-07-20 ENCOUNTER — Encounter: Payer: Self-pay | Admitting: Internal Medicine

## 2022-08-04 ENCOUNTER — Other Ambulatory Visit: Payer: Self-pay | Admitting: Emergency Medicine

## 2022-08-04 DIAGNOSIS — E113413 Type 2 diabetes mellitus with severe nonproliferative diabetic retinopathy with macular edema, bilateral: Secondary | ICD-10-CM

## 2022-10-03 DIAGNOSIS — G4733 Obstructive sleep apnea (adult) (pediatric): Secondary | ICD-10-CM | POA: Diagnosis not present

## 2022-10-09 ENCOUNTER — Ambulatory Visit: Payer: 59 | Admitting: Emergency Medicine

## 2022-10-17 ENCOUNTER — Other Ambulatory Visit: Payer: Self-pay | Admitting: Emergency Medicine

## 2022-10-17 DIAGNOSIS — I152 Hypertension secondary to endocrine disorders: Secondary | ICD-10-CM

## 2022-10-18 ENCOUNTER — Encounter: Payer: Self-pay | Admitting: Emergency Medicine

## 2022-10-20 MED ORDER — "PEN NEEDLES 5/16"" 31G X 8 MM MISC": 3 refills | Status: DC

## 2022-12-03 ENCOUNTER — Encounter: Payer: Self-pay | Admitting: Emergency Medicine

## 2022-12-03 DIAGNOSIS — E113413 Type 2 diabetes mellitus with severe nonproliferative diabetic retinopathy with macular edema, bilateral: Secondary | ICD-10-CM

## 2022-12-04 ENCOUNTER — Encounter: Payer: Self-pay | Admitting: Emergency Medicine

## 2022-12-04 DIAGNOSIS — E669 Obesity, unspecified: Secondary | ICD-10-CM

## 2022-12-04 DIAGNOSIS — E113413 Type 2 diabetes mellitus with severe nonproliferative diabetic retinopathy with macular edema, bilateral: Secondary | ICD-10-CM

## 2023-01-01 ENCOUNTER — Other Ambulatory Visit: Payer: Self-pay | Admitting: Emergency Medicine

## 2023-01-01 DIAGNOSIS — G4733 Obstructive sleep apnea (adult) (pediatric): Secondary | ICD-10-CM | POA: Diagnosis not present

## 2023-01-01 DIAGNOSIS — E113413 Type 2 diabetes mellitus with severe nonproliferative diabetic retinopathy with macular edema, bilateral: Secondary | ICD-10-CM

## 2023-01-05 ENCOUNTER — Ambulatory Visit (INDEPENDENT_AMBULATORY_CARE_PROVIDER_SITE_OTHER): Payer: 59 | Admitting: Nurse Practitioner

## 2023-01-05 ENCOUNTER — Encounter: Payer: Self-pay | Admitting: Nurse Practitioner

## 2023-01-05 VITALS — BP 128/80 | HR 76 | Temp 98.3°F | Ht 68.0 in | Wt 236.0 lb

## 2023-01-05 DIAGNOSIS — E113413 Type 2 diabetes mellitus with severe nonproliferative diabetic retinopathy with macular edema, bilateral: Secondary | ICD-10-CM | POA: Diagnosis not present

## 2023-01-05 DIAGNOSIS — Z6835 Body mass index (BMI) 35.0-35.9, adult: Secondary | ICD-10-CM

## 2023-01-05 DIAGNOSIS — E1159 Type 2 diabetes mellitus with other circulatory complications: Secondary | ICD-10-CM | POA: Diagnosis not present

## 2023-01-05 DIAGNOSIS — Z794 Long term (current) use of insulin: Secondary | ICD-10-CM

## 2023-01-05 DIAGNOSIS — I152 Hypertension secondary to endocrine disorders: Secondary | ICD-10-CM

## 2023-01-05 DIAGNOSIS — E1169 Type 2 diabetes mellitus with other specified complication: Secondary | ICD-10-CM

## 2023-01-05 DIAGNOSIS — E785 Hyperlipidemia, unspecified: Secondary | ICD-10-CM | POA: Diagnosis not present

## 2023-01-05 DIAGNOSIS — Z0289 Encounter for other administrative examinations: Secondary | ICD-10-CM

## 2023-01-05 NOTE — Progress Notes (Signed)
Office: [phone]  /  Fax: [phone]   Initial Visit  Aaron Woods was seen in clinic today to evaluate for obesity. He is interested in losing weight to improve overall health and reduce the risk of weight related complications. He presents today to review program treatment options, initial physical assessment, and evaluation.     He was referred by: PCP  When asked what else they would like to accomplish? He states: Improve energy levels and physical activity, Improve existing medical conditions, Reduce number of medications, and Lose a target amount of weight : 36lbs  Weight history: He played multiple sports in high school and gained weight when he got out of high school.   He gained excess weight in 2006 and his weight has fluctuated over the years.   When asked how has your weight affected you? He states: Contributed to medical problems, Contributed to orthopedic problems or mobility issues, Having fatigue, and Having poor endurance  Some associated conditions: HTN, DMT2, OSAS on CPAP, HLD, microcytic anemia  Contributing factors: Family history and Medications, job, Covid and stress eating   Weight promoting medications identified: diabetic meds, steroids   Current nutrition plan: None  Current level of physical activity: Walking  Current or previous pharmacotherapy: None  Response to medication: Never tried medications   Past medical history includes:   Past Medical History:  Diagnosis Date   Borderline high blood pressure    Diabetes 1.5, managed as type 2 (HCC)    Diabetes mellitus      Objective:   BP 128/80   Pulse 76   Temp 98.3 F (36.8 C)   Ht 5\' 8"  (1.727 m)   Wt 236 lb (107 kg)   SpO2 98%   BMI 35.88 kg/m  He was weighed on the bioimpedance scale: Body mass index is 35.88 kg/m.  Peak Weight:278 lnd , Body Fat%:32, Visceral Fat Rating:16, Weight trend over the last 12 months: Increasing  General:  Alert, oriented and cooperative. Patient  is in no acute distress.  Respiratory: Normal respiratory effort, no problems with respiration noted   Gait: able to ambulate independently  Mental Status: Normal mood and affect. Normal behavior. Normal judgment and thought content.   DIAGNOSTIC DATA REVIEWED:  BMET    Component Value Date/Time   NA 140 03/07/2022 0807   NA 138 11/02/2018 1702   K 3.9 03/07/2022 0807   CL 101 03/07/2022 0807   CO2 31 03/07/2022 0807   GLUCOSE 307 (H) 03/07/2022 0807   BUN 25 (H) 03/07/2022 0807   BUN 11 11/02/2018 1702   CREATININE 1.28 03/07/2022 0807   CALCIUM 9.7 03/07/2022 0807   GFRNONAA >60 12/07/2019 0502   GFRAA >60 12/07/2019 0502   Lab Results  Component Value Date   HGBA1C 9.4 (A) 07/10/2022   HGBA1C 13.4 (H) 11/02/2018   No results found for: "INSULIN" CBC    Component Value Date/Time   WBC 6.8 03/07/2022 0807   RBC 5.11 03/07/2022 0807   HGB 12.5 (L) 03/07/2022 0807   HGB 12.9 (L) 11/02/2018 1702   HCT 39.0 03/07/2022 0807   HCT 40.8 11/02/2018 1702   PLT 309.0 03/07/2022 0807   PLT 336 11/02/2018 1702   MCV 76.5 (L) 03/07/2022 0807   MCV 77 (L) 11/02/2018 1702   MCH 24.1 (L) 12/07/2019 0502   MCHC 32.1 03/07/2022 0807   RDW 14.0 03/07/2022 0807   RDW 13.0 11/02/2018 1702   Iron/TIBC/Ferritin/ %Sat    Component Value Date/Time  IRON 21 (L) 12/02/2019 0427   TIBC 271 12/02/2019 0427   FERRITIN 814 (H) 12/02/2019 0427   IRONPCTSAT 8 (L) 12/02/2019 0427   Lipid Panel     Component Value Date/Time   CHOL 156 03/07/2022 0807   CHOL 210 (H) 11/02/2018 1702   TRIG 185.0 (H) 03/07/2022 0807   HDL 35.80 (L) 03/07/2022 0807   HDL 42 11/02/2018 1702   CHOLHDL 4 03/07/2022 0807   VLDL 37.0 03/07/2022 0807   LDLCALC 83 03/07/2022 0807   LDLCALC 151 (H) 11/02/2018 1702   Hepatic Function Panel     Component Value Date/Time   PROT 7.0 03/07/2022 0807   PROT 6.7 11/02/2018 1702   ALBUMIN 4.1 03/07/2022 0807   ALBUMIN 4.2 11/02/2018 1702   AST 27 03/07/2022  0807   ALT 48 03/07/2022 0807   ALKPHOS 95 03/07/2022 0807   BILITOT 0.3 03/07/2022 0807   BILITOT 0.4 11/02/2018 1702   BILIDIR 0.1 10/12/2020 1608      Component Value Date/Time   TSH 1.76 04/25/2021 0945     Assessment and Plan:   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  Continue meds as directed.   Dyslipidemia associated with type 2 diabetes mellitus (HCC) Continue to follow up with PCP.  Continue meds as directed.   Hypertension associated with diabetes (HCC) Continue to follow up with PCP.  Continue meds as directed.   Morbid obesity (HCC)  BMI 35.0-35.9,adult        Obesity Treatment / Action Plan:  Patient will work on garnering support from family and friends to begin weight loss journey. Will work on eliminating or reducing the presence of highly palatable, calorie dense foods in the home. Will complete provided nutritional and psychosocial assessment questionnaire before the next appointment. Will be scheduled for indirect calorimetry to determine resting energy expenditure in a fasting state.  This will allow [place] to create a reduced calorie, high-protein meal plan to promote loss of fat mass while preserving muscle mass. Counseled on the health benefits of losing 5%-15% of total body weight. Was counseled on nutritional approaches to weight loss and benefits of reducing processed foods and consuming plant-based foods and high quality protein as part of nutritional weight management. Was counseled on pharmacotherapy and role as an adjunct in weight management.   Obesity Education Performed Today:  He was weighed on the bioimpedance scale and results were discussed and documented in the synopsis.  We discussed obesity as a disease and the importance of a more detailed evaluation of all the factors contributing to the  disease.  We discussed the importance of long term lifestyle changes which include nutrition, exercise and behavioral modifications as well as the importance of customizing this to his specific health and social needs.  We discussed the benefits of reaching a healthier weight to alleviate the symptoms of existing conditions and reduce the risks of the biomechanical, metabolic and psychological effects of obesity.  VADHIR MCNAY appears to be in the action stage of change and states they are ready to start intensive lifestyle modifications and behavioral modifications.  30 minutes was spent today on this visit including the above counseling, pre-visit chart review, and post-visit documentation.  Reviewed by clinician on day of visit: allergies, medications, problem list, medical history, surgical history, family history, social history, and previous encounter notes pertinent to obesity diagnosis.  Theodis Sato Shatara Stanek FNP-C

## 2023-01-07 ENCOUNTER — Encounter: Payer: Self-pay | Admitting: Bariatrics

## 2023-01-07 ENCOUNTER — Ambulatory Visit (INDEPENDENT_AMBULATORY_CARE_PROVIDER_SITE_OTHER): Payer: 59 | Admitting: Bariatrics

## 2023-01-07 VITALS — BP 111/77 | HR 77 | Temp 97.9°F | Ht 68.0 in | Wt 232.0 lb

## 2023-01-07 DIAGNOSIS — E1169 Type 2 diabetes mellitus with other specified complication: Secondary | ICD-10-CM

## 2023-01-07 DIAGNOSIS — I152 Hypertension secondary to endocrine disorders: Secondary | ICD-10-CM

## 2023-01-07 DIAGNOSIS — R0602 Shortness of breath: Secondary | ICD-10-CM

## 2023-01-07 DIAGNOSIS — Z794 Long term (current) use of insulin: Secondary | ICD-10-CM

## 2023-01-07 DIAGNOSIS — E785 Hyperlipidemia, unspecified: Secondary | ICD-10-CM

## 2023-01-07 DIAGNOSIS — Z1331 Encounter for screening for depression: Secondary | ICD-10-CM | POA: Diagnosis not present

## 2023-01-07 DIAGNOSIS — R5383 Other fatigue: Secondary | ICD-10-CM | POA: Diagnosis not present

## 2023-01-07 DIAGNOSIS — G4733 Obstructive sleep apnea (adult) (pediatric): Secondary | ICD-10-CM | POA: Insufficient documentation

## 2023-01-07 DIAGNOSIS — E66812 Obesity, class 2: Secondary | ICD-10-CM | POA: Diagnosis not present

## 2023-01-07 DIAGNOSIS — Z6835 Body mass index (BMI) 35.0-35.9, adult: Secondary | ICD-10-CM

## 2023-01-07 DIAGNOSIS — E786 Lipoprotein deficiency: Secondary | ICD-10-CM | POA: Insufficient documentation

## 2023-01-07 DIAGNOSIS — E113413 Type 2 diabetes mellitus with severe nonproliferative diabetic retinopathy with macular edema, bilateral: Secondary | ICD-10-CM | POA: Diagnosis not present

## 2023-01-08 NOTE — Progress Notes (Signed)
Chief Complaint:   OBESITY Aaron Woods (MR# [id number]) is a 37 y.o. male who presents for evaluation and treatment of obesity and related comorbidities. Current BMI is Body mass index is 35.28 kg/m. Aaron Woods has been struggling with his weight for many years and has been unsuccessful in either losing weight, maintaining weight loss, or reaching his healthy weight goal.  Aaron Woods is currently in the action stage of change and ready to dedicate time achieving and maintaining a healthier weight. Aaron Woods is interested in becoming our patient and working on intensive lifestyle modifications including (but not limited to) diet and exercise for weight loss.  Patient met with Judeth Cornfield, nurse practitioner on 01/05/2023.  He states he likes to cook.  He craves carbohydrates and fatty foods.  Aaron Woods's habits were reviewed today and are as follows: His family eats meals together, he thinks his family will eat healthier with him, his desired weight loss is 32 lbs, he has been heavy most of his life, he started gaining excessive weight after high school, his heaviest weight ever was 268 pounds, he has significant food cravings issues, he snacks frequently in the evenings, he wakes up frequently in the middle of the night to eat, he skips meals frequently, he is frequently drinking liquids with calories, he frequently makes poor food choices, he has problems with excessive hunger, he frequently eats larger portions than normal, and he struggles with emotional eating.  Depression Screen Aaron Woods's Food and Mood (modified PHQ-9) score was 7.  Subjective:   1. Other fatigue Aaron Woods admits to daytime somnolence and admits to waking up still tired. Patient has a history of symptoms of daytime fatigue and morning fatigue. Aaron Woods generally gets 5 or 6 hours of sleep per night, and states that he has nightime awakenings. Snoring is present. Apneic episodes are present. Epworth Sleepiness Score is 9.   2.  SOB (shortness of breath) on exertion Aaron Woods notes increasing shortness of breath with exercising and seems to be worsening over time with weight gain. He notes getting out of breath sooner with activity than he used to. This has not gotten worse recently. Aaron Woods denies shortness of breath at rest or orthopnea.  3. 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  He denies low blood sugars.  His fasting blood sugars range between 120-130 and 2-hour postprandial 160-170.  4. Dyslipidemia associated with type 2 diabetes mellitus (HCC) Patient is currently taking Crestor.  5. Hypertension associated with diabetes (HCC) Patient is taking Micardis HCT, and his blood pressure is controlled.  6. Low HDL (under 40) Patient is taking Crestor, his last labs were in December 2023.  7. OSA (obstructive sleep apnea) Patient is wearing his CPAP nightly, and he notes it helps.  Assessment/Plan:   1. Other fatigue Aaron Woods does feel that his weight is causing his energy to be lower than it should be. Fatigue may be related to obesity, depression or many other causes. Labs will be ordered, and in the meanwhile, Dakai will focus on self care including making healthy food choices, increasing physical activity and focusing on stress reduction.  - TSH+T4F+T3Free - VITAMIN D 25 Hydroxy (Vit-D Deficiency, Fractures)  2. SOB (shortness of breath) on exertion Aaron Woods does feel that he gets out of breath more easily that he used to when he exercises. Aaron Woods's shortness of breath appears to be obesity  related and exercise induced. He has agreed to work on weight loss and gradually increase exercise to treat his exercise induced shortness of breath. Will continue to monitor closely.  3. 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  Patient will continue his medications, and he will continue to check his fasting blood sugars and 2-hour postprandial in the evening.  - Hemoglobin A1c - Insulin, random - Comprehensive metabolic panel - Microalbumin / creatinine urine ratio  4. Dyslipidemia associated with type 2 diabetes mellitus (HCC) We will check labs today.  Patient will continue Crestor as directed.  - Lipid Panel With LDL/HDL Ratio  5. Hypertension associated with diabetes [facility]) Patient will continue his medications as directed.  6. Low HDL (under 40) We will check labs today.  Patient will continue to work on his meal plan and exercise.  - Lipid Panel With LDL/HDL Ratio  7. OSA (obstructive sleep apnea) Patient will continue to use his CPAP.  8. Depression screening Aaron Woods had a positive depression screening. Depression is commonly associated with obesity and often results in emotional eating behaviors. We will monitor this closely and work on CBT to help improve the non-hunger eating patterns. Referral to Psychology may be required if no improvement is seen as he continues in our clinic.  9. Class 2 severe obesity due to excess calories with serious comorbidity and body mass index (BMI) of 35.0 to 35.9 in adult Aaron Woods) Aaron Woods is currently in the action stage of change and his goal is to continue with weight loss efforts. I recommend Aaron Woods begin the structured treatment plan as follows:  He has agreed to the Category 3 Plan.  Meal planning and intentional eating were discussed.  Handout was given on shakes.  Reviewed labs with the patient; CMP and lipids.  Exercise goals: Increase walking gradually.  Behavioral modification strategies: increasing lean protein intake, decreasing simple carbohydrates, increasing vegetables, increasing water intake, decreasing eating out, no skipping  meals, meal planning and cooking strategies, and keeping healthy foods in the home.  He was informed of the importance of frequent follow-up visits to maximize his success with intensive lifestyle modifications for his multiple health conditions. He was informed we would discuss his lab results at his next visit unless there is a critical issue that needs to be addressed sooner. Aaron Woods agreed to keep his next visit at the agreed upon time to discuss these results.  Objective:   Blood pressure 111/77, pulse 77, temperature 97.9 F (36.6 C), height 5\' 8"  (1.727 m), weight 232 lb (105.2 kg), SpO2 100%. Body mass index is 35.28 kg/m.  EKG: Normal sinus rhythm, rate (unable to obtain).  Indirect Calorimeter completed today shows a VO2 of 262 and a REE of 1800.  His calculated basal metabolic rate is 0981 thus his basal metabolic rate is worse than expected.  General: Cooperative, alert, well developed, in no acute distress. HEENT: Conjunctivae and lids unremarkable. Cardiovascular: Regular rhythm.  Lungs: Normal work of breathing. Neurologic: No focal deficits.   Lab Results  Component Value Date   CREATININE 1.28 03/07/2022   BUN 25 (H) 03/07/2022   NA 140 03/07/2022   K 3.9 03/07/2022   CL 101 03/07/2022   CO2 31 03/07/2022   Lab Results  Component Value Date   ALT 48 03/07/2022   AST 27 03/07/2022   ALKPHOS 95 03/07/2022   BILITOT 0.3 03/07/2022  Lab Results  Component Value Date   HGBA1C 9.4 (A) 07/10/2022   HGBA1C 10.9 (A) 03/06/2022   HGBA1C 11.4 (A) 02/21/2021   HGBA1C 9.5 (H) 10/12/2020   HGBA1C 8.8 (H) 12/01/2019   No results found for: "INSULIN" Lab Results  Component Value Date   TSH 1.76 04/25/2021   Lab Results  Component Value Date   CHOL 156 03/07/2022   HDL 35.80 (L) 03/07/2022   LDLCALC 83 03/07/2022   TRIG 185.0 (H) 03/07/2022   CHOLHDL 4 03/07/2022   Lab Results  Component Value Date   WBC 6.8 03/07/2022   HGB 12.5 (L) 03/07/2022   HCT 39.0  03/07/2022   MCV 76.5 (L) 03/07/2022   PLT 309.0 03/07/2022   Lab Results  Component Value Date   IRON 21 (L) 12/02/2019   TIBC 271 12/02/2019   FERRITIN 814 (H) 12/02/2019   Attestation Statements:   Reviewed by clinician on day of visit: allergies, medications, problem list, medical history, surgical history, family history, social history, and previous encounter notes.  Time spent on visit including pre-visit chart review and post-visit charting and care was 40 minutes.   Trude Mcburney, am acting as Energy manager for [employer], DO.  I have reviewed the above documentation for accuracy and completeness, and I agree with the above. Corinna Capra, DO

## 2023-01-09 LAB — TSH+T4F+T3FREE
Free T4: 1.33 ng/dL (ref 0.82–1.77)
T3, Free: 3.1 pg/mL (ref 2.0–4.4)
TSH: 2.23 u[IU]/mL (ref 0.450–4.500)

## 2023-01-09 LAB — COMPREHENSIVE METABOLIC PANEL
ALT: 37 [IU]/L (ref 0–44)
AST: 22 [IU]/L (ref 0–40)
Albumin: 4.2 g/dL (ref 4.1–5.1)
Alkaline Phosphatase: 88 [IU]/L (ref 44–121)
BUN/Creatinine Ratio: 18 (ref 9–20)
BUN: 24 mg/dL — ABNORMAL HIGH (ref 6–20)
Bilirubin Total: 0.3 mg/dL (ref 0.0–1.2)
CO2: 25 mmol/L (ref 20–29)
Calcium: 9.8 mg/dL (ref 8.7–10.2)
Chloride: 104 mmol/L (ref 96–106)
Creatinine, Ser: 1.34 mg/dL — ABNORMAL HIGH (ref 0.76–1.27)
Globulin, Total: 2.6 g/dL (ref 1.5–4.5)
Glucose: 159 mg/dL — ABNORMAL HIGH (ref 70–99)
Potassium: 4.4 mmol/L (ref 3.5–5.2)
Sodium: 142 mmol/L (ref 134–144)
Total Protein: 6.8 g/dL (ref 6.0–8.5)
eGFR: 69 mL/min/{1.73_m2} (ref >59)

## 2023-01-09 LAB — MICROALBUMIN / CREATININE URINE RATIO
Creatinine, Urine: 63.9 mg/dL
Microalb/Creat Ratio: <5 mg/g{creat} (ref 0–29)
Microalbumin, Urine: <3 ug/mL

## 2023-01-09 LAB — HEMOGLOBIN A1C
Est. average glucose Bld gHb Est-mCnc: 200 mg/dL
Hgb A1c MFr Bld: 8.6 % — ABNORMAL HIGH (ref 4.8–5.6)

## 2023-01-09 LAB — LIPID PANEL WITH LDL/HDL RATIO
Cholesterol, Total: 176 mg/dL (ref 100–199)
HDL: 42 mg/dL (ref >39)
LDL Chol Calc (NIH): 115 mg/dL — ABNORMAL HIGH (ref 0–99)
LDL/HDL Ratio: 2.7 {ratio} (ref 0.0–3.6)
Triglycerides: 102 mg/dL (ref 0–149)
VLDL Cholesterol Cal: 19 mg/dL (ref 5–40)

## 2023-01-09 LAB — VITAMIN D 25 HYDROXY (VIT D DEFICIENCY, FRACTURES): Vit D, 25-Hydroxy: 21.7 ng/mL — ABNORMAL LOW (ref 30.0–100.0)

## 2023-01-09 LAB — INSULIN, RANDOM: INSULIN: 9.9 u[IU]/mL (ref 2.6–24.9)

## 2023-01-12 ENCOUNTER — Encounter: Payer: Self-pay | Admitting: Bariatrics

## 2023-01-12 DIAGNOSIS — E559 Vitamin D deficiency, unspecified: Secondary | ICD-10-CM | POA: Insufficient documentation

## 2023-01-21 ENCOUNTER — Telehealth (INDEPENDENT_AMBULATORY_CARE_PROVIDER_SITE_OTHER): Payer: Self-pay | Admitting: Bariatrics

## 2023-01-21 ENCOUNTER — Ambulatory Visit (INDEPENDENT_AMBULATORY_CARE_PROVIDER_SITE_OTHER): Payer: 59 | Admitting: Bariatrics

## 2023-01-21 ENCOUNTER — Encounter: Payer: Self-pay | Admitting: Bariatrics

## 2023-01-21 VITALS — BP 118/80 | HR 71 | Temp 97.5°F | Ht 68.0 in | Wt 235.0 lb

## 2023-01-21 DIAGNOSIS — E113413 Type 2 diabetes mellitus with severe nonproliferative diabetic retinopathy with macular edema, bilateral: Secondary | ICD-10-CM | POA: Diagnosis not present

## 2023-01-21 DIAGNOSIS — Z794 Long term (current) use of insulin: Secondary | ICD-10-CM | POA: Diagnosis not present

## 2023-01-21 DIAGNOSIS — Z7984 Long term (current) use of oral hypoglycemic drugs: Secondary | ICD-10-CM

## 2023-01-21 DIAGNOSIS — Z7985 Long-term (current) use of injectable non-insulin antidiabetic drugs: Secondary | ICD-10-CM

## 2023-01-21 DIAGNOSIS — Z6835 Body mass index (BMI) 35.0-35.9, adult: Secondary | ICD-10-CM

## 2023-01-21 DIAGNOSIS — E559 Vitamin D deficiency, unspecified: Secondary | ICD-10-CM | POA: Diagnosis not present

## 2023-01-21 DIAGNOSIS — E669 Obesity, unspecified: Secondary | ICD-10-CM

## 2023-01-21 MED ORDER — VITAMIN D (ERGOCALCIFEROL) 1.25 MG (50000 UNIT) PO CAPS: 50000.0000 [IU] | ORAL_CAPSULE | ORAL | 0 refills | Status: DC

## 2023-01-21 NOTE — Telephone Encounter (Signed)
Patient and his wife called in stating they need to know what the minimum calorie, and protein intake is daily for both. Please call to advise.  Working on getting DPR signed so we can speak with his spouse. Patient verbally gave me the ok to speak with her today.   Thank you

## 2023-01-21 NOTE — Telephone Encounter (Signed)
Spoke to patient's wife Genene Churn and notified her that per Dr. Manson Passey patient is to have 1500 calories and 90-110 grams of protein daily. Tarah verbalized understanding.

## 2023-01-21 NOTE — Progress Notes (Signed)
WEIGHT SUMMARY AND BIOMETRICS  Weight Lost Since Last Visit: 0  Weight Gained Since Last Visit: 3lb   Vitals Temp: (!) 97.5 F (36.4 C) BP: 118/80 Pulse Rate: 71 SpO2: 100 %   Anthropometric Measurements Height: 5\' 8"  (1.727 m) Weight: 235 lb (106.6 kg) BMI (Calculated): 35.74 Weight at Last Visit: 232lb Weight Lost Since Last Visit: 0 Weight Gained Since Last Visit: 3lb Starting Weight: 232lb Total Weight Loss (lbs): 0 lb (0 kg)   Body Composition  Body Fat %: 31.4 % Fat Mass (lbs): 74 lbs Muscle Mass (lbs): 153.4 lbs Total Body Water (lbs): 48.3 lbs Visceral Fat Rating : 15   Other Clinical Data Fasting: no Labs: no Today's Visit #: 2 Starting Date: 01/07/23    OBESITY Aaron Woods is here to discuss his progress with his obesity treatment plan along with follow-up of his obesity related diagnoses.   Nutrition Plan: the Category 3 plan - 75 % adherence.  Current exercise: walking  Interim History:  He is up 3 lbs since his/her initial visit.  Issues that were identified with the plan after the first 2 weeks.    Eating all of the food on the plan., Protein intake is as prescribed, Is not skipping meals, Water intake is adequate., and Denies polyphagia  Pharmacotherapy: Salvatore had been on Victoza 1.8 mg SQ daily, but stopped and started [facility] and up to 7.5 mg.   Adverse side effects: GERD Hunger is moderately controlled.  Cravings are moderately controlled.    Assessment/Plan:  1. Type II Diabetes HgbA1c is not at goal. Last A1c was 8.6 CBGs: Fasting 120's       Episodes of hypoglycemia: no Medication(s): Victoza 1.8 mg SQ daily He is also taking [place].   Lab Results  Component Value Date   HGBA1C 8.6 (H) 01/07/2023   HGBA1C 9.4 (A) 07/10/2022   HGBA1C 10.9 (A) 03/06/2022   Lab Results  Component Value Date   MICROALBUR <0.7 03/07/2022   LDLCALC 115 (H) 01/07/2023   CREATININE 1.34 (H) 01/07/2023   Lab Results   Component Value Date   GFR 70.88 03/07/2022   GFR 79.43 04/25/2021   GFR 105.02 10/12/2020    Plan: For right now will hold GLP's per patient and follow over time.  Continue all other medications.  Will keep all carbohydrates low both sweets and starches.  Will continue exercise regimen to 30 to 60 minutes on most days of the week.  Aim for 7 to 9 hours of sleep nightly.  Eat more low glycemic index foods.  High protein food choices.  2. Vitamin D Deficiency Vitamin D is not at goal of 50.  Most recent vitamin D level was 21.7. He is on no vitamin D Lab Results  Component Value Date   VD25OH 21.7 (L) 01/07/2023    Plan: Refill prescription vitamin D 50,000 IU weekly.    Generalized Obesity: Current BMI 35.73  Reviewed labs from date/ dates ( CMP, Lipids, Vitamin D, Hgb-A1c, insulin, and thyroid panel ).    Pharmacotherapy Plan Continue  Victoza 1.8 mg SQ daily  Nicholous is currently in the action stage of change. As such, his goal is to continue with weight loss efforts.  He has agreed to the Category 3 plan.  Exercise goals: All adults should avoid inactivity. Some physical activity is better than none, and adults who participate in any amount of physical activity gain some health benefits.  Behavioral modification strategies: increasing lean protein intake, no  meal skipping, increase water intake, better snacking choices, planning for success, increasing vegetables, decrease junk food, ways to avoid night time snacking, avoiding temptations, and mindful eating.  Taheem has agreed to follow-up with our clinic in 2 weeks.     Objective:   VITALS: Per patient if applicable, see vitals. GENERAL: Alert and in no acute distress. CARDIOPULMONARY: No increased WOB. Speaking in clear sentences.  PSYCH: Pleasant and cooperative. Speech normal rate and rhythm. Affect is appropriate. Insight and judgement are appropriate. Attention is focused, linear, and appropriate.   NEURO: Oriented as arrived to appointment on time with no prompting.   Attestation Statements:  This was prepared with the assistance of Engineer, civil (consulting).  Occasional wrong-word or sound-a-like substitutions may have occurred due to the inherent limitations of voice recognition software.   Corinna Capra, DO

## 2023-02-01 ENCOUNTER — Encounter: Payer: Self-pay | Admitting: Emergency Medicine

## 2023-02-12 ENCOUNTER — Ambulatory Visit: Payer: 59 | Admitting: Bariatrics

## 2023-02-12 ENCOUNTER — Encounter: Payer: Self-pay | Admitting: Bariatrics

## 2023-02-12 VITALS — BP 120/76 | HR 77 | Temp 97.8°F | Ht 68.0 in | Wt 233.0 lb

## 2023-02-12 DIAGNOSIS — E559 Vitamin D deficiency, unspecified: Secondary | ICD-10-CM

## 2023-02-12 DIAGNOSIS — Z6835 Body mass index (BMI) 35.0-35.9, adult: Secondary | ICD-10-CM

## 2023-02-12 DIAGNOSIS — E1159 Type 2 diabetes mellitus with other circulatory complications: Secondary | ICD-10-CM | POA: Diagnosis not present

## 2023-02-12 DIAGNOSIS — E669 Obesity, unspecified: Secondary | ICD-10-CM

## 2023-02-12 DIAGNOSIS — I152 Hypertension secondary to endocrine disorders: Secondary | ICD-10-CM

## 2023-02-12 DIAGNOSIS — Z7985 Long-term (current) use of injectable non-insulin antidiabetic drugs: Secondary | ICD-10-CM

## 2023-02-12 MED ORDER — TELMISARTAN-HCTZ 80-12.5 MG PO TABS
1.0000 | ORAL_TABLET | Freq: Every day | ORAL | 0 refills | Status: AC
Start: 2023-02-12 — End: ?

## 2023-02-12 MED ORDER — VITAMIN D (ERGOCALCIFEROL) 1.25 MG (50000 UNIT) PO CAPS: 50000.0000 [IU] | ORAL_CAPSULE | ORAL | 0 refills | Status: DC

## 2023-02-12 NOTE — Progress Notes (Signed)
WEIGHT SUMMARY AND BIOMETRICS  Weight Lost Since Last Visit: 2  Weight Gained Since Last Visit: 0   Vitals Temp: 97.8 F (36.6 C) BP: 120/76 Pulse Rate: 77 SpO2: 97 %   Anthropometric Measurements Height: 5\' 8"  (1.727 m) Weight: 233 lb (105.7 kg) BMI (Calculated): 35.44 Weight at Last Visit: 235 lb Weight Lost Since Last Visit: 2 Weight Gained Since Last Visit: 0 Starting Weight: 232 lb Total Weight Loss (lbs): 0 lb (0 kg)   Body Composition  Body Fat %: 31.7 % Fat Mass (lbs): 74 lbs Muscle Mass (lbs): 151.4 lbs Total Body Water (lbs): 111.8 lbs Visceral Fat Rating : 15   Other Clinical Data Today's Visit #: 3 Starting Date: 01/07/23    OBESITY Aaron Woods is here to discuss his progress with his obesity treatment plan along with follow-up of his obesity related diagnoses.    Nutrition Plan: the Category 3 plan - 50% adherence.  Current exercise: walking 60-90 minutes 4 times per week  Interim History:  He is down 2 lbs since his last visit. He has had some cravings but managing it.  Eating all of the food on the plan., Protein intake is as prescribed, Is not skipping meals, Water intake is adequate., Denies polyphagia, and Reports excessive cravings.   Hunger is moderately controlled.  Cravings are moderately controlled.  Assessment/Plan:   1. Hypertension associated with diabetes (HCC) Hypertension Hypertension stable.  Medication(s): Micardis HCT 80-12.5 mg  BP Readings from Last 3 Encounters:  02/12/23 120/76  01/21/23 118/80  01/07/23 111/77   Lab Results  Component Value Date   CREATININE 1.34 (H) 01/07/2023   CREATININE 1.28 03/07/2022   CREATININE 1.17 04/25/2021   Lab Results  Component Value Date   GFR 70.88 03/07/2022   GFR 79.43 04/25/2021   GFR 105.02 10/12/2020    Plan: Continue all antihypertensives at current  dosages. No added salt. Will keep sodium content to 1,500 mg or less per day.    Vitamin D Deficiency Vitamin D is not at goal of 50.  Most recent vitamin D level was 21.7. He is on  prescription ergocalciferol 50,000 IU weekly. Lab Results  Component Value Date   VD25OH 21.7 (L) 01/07/2023    Plan: Refill prescription vitamin D 50,000 IU weekly.     Generalized Obesity: Current BMI BMI (Calculated): 35.44    Aaron Woods is currently in the action stage of change. As such, his goal is to continue with weight loss efforts.  He has agreed to the Category 3 plan.  Exercise goals: All adults should avoid inactivity. Some physical activity is better than none, and adults who participate in any amount of physical activity gain some health benefits.  Behavioral modification strategies: increasing lean protein intake, no meal skipping, decrease eating out, meal planning , increase water intake, better snacking choices, avoiding temptations, keep healthy foods in  the home, and mindful eating.  Aaron Woods has agreed to follow-up with our clinic in 4 weeks.   No orders of the defined types were placed in this encounter.   Medications Discontinued During This Encounter  Medication Reason   liraglutide (VICTOZA) 18 MG/3ML SOPN Patient Preference      Objective:   VITALS: Per patient if applicable, see vitals. GENERAL: Alert and in no acute distress. CARDIOPULMONARY: No increased WOB. Speaking in clear sentences.  PSYCH: Pleasant and cooperative. Speech normal rate and rhythm. Affect is appropriate. Insight and judgement are appropriate. Attention is focused, linear, and appropriate.  NEURO: Oriented as arrived to appointment on time with no prompting.   Attestation Statements:   This was prepared with the assistance of Engineer, civil (consulting).  Occasional wrong-word or sound-a-like substitutions may have occurred due to the inherent limitations of voice recognition   Aaron Capra,  DO

## 2023-02-20 ENCOUNTER — Other Ambulatory Visit: Payer: Self-pay | Admitting: Emergency Medicine

## 2023-02-28 ENCOUNTER — Other Ambulatory Visit: Payer: Self-pay | Admitting: Bariatrics

## 2023-02-28 DIAGNOSIS — E559 Vitamin D deficiency, unspecified: Secondary | ICD-10-CM

## 2023-03-12 ENCOUNTER — Encounter: Payer: Self-pay | Admitting: Bariatrics

## 2023-03-12 ENCOUNTER — Other Ambulatory Visit: Payer: Self-pay | Admitting: Emergency Medicine

## 2023-03-12 ENCOUNTER — Ambulatory Visit: Payer: 59 | Admitting: Bariatrics

## 2023-03-12 VITALS — BP 99/67 | HR 89 | Temp 97.8°F | Ht 68.0 in | Wt 227.0 lb

## 2023-03-12 DIAGNOSIS — E119 Type 2 diabetes mellitus without complications: Secondary | ICD-10-CM

## 2023-03-12 DIAGNOSIS — E559 Vitamin D deficiency, unspecified: Secondary | ICD-10-CM

## 2023-03-12 DIAGNOSIS — Z7984 Long term (current) use of oral hypoglycemic drugs: Secondary | ICD-10-CM

## 2023-03-12 DIAGNOSIS — Z6834 Body mass index (BMI) 34.0-34.9, adult: Secondary | ICD-10-CM | POA: Diagnosis not present

## 2023-03-12 DIAGNOSIS — E669 Obesity, unspecified: Secondary | ICD-10-CM | POA: Diagnosis not present

## 2023-03-12 DIAGNOSIS — Z794 Long term (current) use of insulin: Secondary | ICD-10-CM

## 2023-03-12 DIAGNOSIS — E6609 Other obesity due to excess calories: Secondary | ICD-10-CM

## 2023-03-12 DIAGNOSIS — E113413 Type 2 diabetes mellitus with severe nonproliferative diabetic retinopathy with macular edema, bilateral: Secondary | ICD-10-CM

## 2023-03-12 MED ORDER — VITAMIN D (ERGOCALCIFEROL) 1.25 MG (50000 UNIT) PO CAPS: 50000.0000 [IU] | ORAL_CAPSULE | ORAL | 0 refills | Status: DC

## 2023-03-12 NOTE — Addendum Note (Signed)
Addended byIva Lento T on: 03/12/2023 11:35 AM   Modules accepted: Orders

## 2023-03-12 NOTE — Progress Notes (Signed)
WEIGHT SUMMARY AND BIOMETRICS  Weight Lost Since Last Visit: 6lb  Weight Gained Since Last Visit: 0   Vitals Temp: 97.8 F (36.6 C) BP: 99/67 Pulse Rate: 89 SpO2: 99 %   Anthropometric Measurements Height: 5\' 8"  (1.727 m) Weight: 227 lb (103 kg) BMI (Calculated): 34.52 Weight at Last Visit: 233lb Weight Lost Since Last Visit: 6lb Weight Gained Since Last Visit: 0 Starting Weight: 232lb Total Weight Loss (lbs): 5 lb (2.268 kg)   Body Composition  Body Fat %: 29.9 % Fat Mass (lbs): 68 lbs Muscle Mass (lbs): 151.6 lbs Total Body Water (lbs): 108.2 lbs Visceral Fat Rating : 14   Other Clinical Data Fasting: no Labs: no Today's Visit #: 4 Starting Date: 01/07/23    OBESITY Aaron Woods is here to discuss his progress with his obesity treatment plan along with follow-up of his obesity related diagnoses.    Nutrition Plan: the Category 3 plan - 75% adherence.  Current exercise: walking  Interim History:  He is down another 6 lbs since his last visit.  Eating all of the food on the plan., Protein intake is as prescribed, Is not drinking sugar sweetened beverages., Is not skipping meals, and Water intake is adequate.   Pharmacotherapy: Aaron Woods is on Mounjaro 7.5 mg SQ weekly Adverse side effects: Nausea Hunger is moderately controlled.  Cravings are moderately controlled.   We discussed calories and protein and he was given a journaling sheet that specified calories for his breakfast, lunch, and dinner.  He also will start using the [employer] app on his phone.  He will continue his walking.  Assessment/Plan:   1. Vitamin D deficiency Vitamin D Deficiency Vitamin D is not at goal of 50.  Most recent vitamin D level was 21.7. He is on  prescription ergocalciferol 50,000 IU weekly. Lab Results  Component Value Date   VD25OH 21.7 (L) 01/07/2023     Plan: Refill prescription vitamin D 50,000 IU weekly.   Type II Diabetes HgbA1c is not at goal. Last A1c was 8.6 CBGs: Post prandial 160 to 170, and fasting 100 to 120.       Episodes of hypoglycemia: no Medication(s): Insulin and Jardiance.   Lab Results  Component Value Date   HGBA1C 8.6 (H) 01/07/2023   HGBA1C 9.4 (A) 07/10/2022   HGBA1C 10.9 (A) 03/06/2022   Lab Results  Component Value Date   MICROALBUR <0.7 03/07/2022   LDLCALC 115 (H) 01/07/2023   CREATININE 1.34 (H) 01/07/2023   Lab Results  Component Value Date   GFR 70.88 03/07/2022   GFR 79.43 04/25/2021   GFR 105.02 10/12/2020    Plan: Continue all other medications ( insulin and his Jardiance) He is also on Mounjaro 7.5 mg.  He states that he has enough Mounjaro to last until he sees his PCP.  He also notes that he is going to  see a new endocrinologist in January.  Will keep all carbohydrates low both sweets and starches.  Will continue exercise regimen to 30 to 60 minutes on most days of the week.  Aim for 7 to 9 hours of sleep nightly.  Eat more low glycemic index foods.     Generalized Obesity: Current BMI BMI (Calculated): 34.52   Pharmacotherapy Plan Continue  All current medications for diabetes.   Aaron Woods is currently in the action stage of change. As such, his goal is to continue with weight loss efforts.  He has agreed to the Category 3 plan.  Exercise goals: For substantial health benefits, adults should do at least 150 minutes (2 hours and 30 minutes) a week of moderate-intensity, or 75 minutes (1 hour and 15 minutes) a week of vigorous-intensity aerobic physical activity, or an equivalent combination of moderate- and vigorous-intensity aerobic activity. Aerobic activity should be performed in episodes of at least 10 minutes, and preferably, it should be spread throughout the week.  Behavioral modification strategies: increasing lean protein intake, meal planning , better snacking choices,  increasing fiber rich foods, avoiding temptations, keep healthy foods in the home, celebration eating strategies, and mindful eating.  Aaron Woods has agreed to follow-up with our clinic in 3 weeks.      Objective:   VITALS: Per patient if applicable, see vitals. GENERAL: Alert and in no acute distress. CARDIOPULMONARY: No increased WOB. Speaking in clear sentences.  PSYCH: Pleasant and cooperative. Speech normal rate and rhythm. Affect is appropriate. Insight and judgement are appropriate. Attention is focused, linear, and appropriate.  NEURO: Oriented as arrived to appointment on time with no prompting.   Attestation Statements:   This was prepared with the assistance of Engineer, civil (consulting).  Occasional wrong-word or sound-a-like substitutions may have occurred due to the inherent limitations of voice recognition   Corinna Capra, DO

## 2023-03-12 NOTE — Telephone Encounter (Signed)
Copied from CRM [id number]. Topic: Clinical - Medication Refill >> Mar 12, 2023 12:12 PM Pascal Lux wrote: Most Recent Primary Care Visit:  Provider: Georgina Quint  Department: [facility]  Visit Type: OFFICE VISIT  Date: 07/10/2022  Medication: MOUNJARO 7.5 MG/0.5ML Pen [[id number]]  Has the patient contacted their pharmacy? Yes (Agent: If no, request that the patient contact the pharmacy for the refill. If patient does not wish to contact the pharmacy document the reason why and proceed with request.) (Agent: If yes, when and what did the pharmacy advise?)  Is this the correct pharmacy for this prescription? Yes If no, delete pharmacy and type the correct one.  This is the patient's preferred pharmacy:  [id number] - [place] ([place]), [place] - [address] [place] [address] Phone: [phone] Fax: [phone]   Has the prescription been filled recently? Yes  Is the patient out of the medication? Yes  Has the patient been seen for an appointment in the last year OR does the patient have an upcoming appointment? Yes  Can we respond through MyChart? Yes  Agent: Please be advised that Rx refills may take up to 3 business days. We ask that you follow-up with your pharmacy.

## 2023-03-17 MED ORDER — MOUNJARO 7.5 MG/0.5ML ~~LOC~~ SOAJ: SUBCUTANEOUS | 3 refills | Status: DC

## 2023-03-29 ENCOUNTER — Other Ambulatory Visit: Payer: Self-pay | Admitting: Emergency Medicine

## 2023-04-01 ENCOUNTER — Ambulatory Visit: Payer: 59 | Admitting: Bariatrics

## 2023-04-03 DIAGNOSIS — G4733 Obstructive sleep apnea (adult) (pediatric): Secondary | ICD-10-CM | POA: Diagnosis not present

## 2023-04-09 ENCOUNTER — Other Ambulatory Visit: Payer: Self-pay | Admitting: Emergency Medicine

## 2023-04-10 ENCOUNTER — Other Ambulatory Visit: Payer: Self-pay | Admitting: Radiology

## 2023-04-10 ENCOUNTER — Ambulatory Visit: Payer: Self-pay | Admitting: Emergency Medicine

## 2023-04-10 ENCOUNTER — Other Ambulatory Visit: Payer: Self-pay | Admitting: Emergency Medicine

## 2023-04-10 DIAGNOSIS — Z794 Long term (current) use of insulin: Secondary | ICD-10-CM

## 2023-04-10 MED ORDER — TRESIBA FLEXTOUCH 200 UNIT/ML ~~LOC~~ SOPN: 40.0000 [IU] | PEN_INJECTOR | Freq: Every day | SUBCUTANEOUS | 6 refills | Status: DC

## 2023-04-10 NOTE — Telephone Encounter (Signed)
Pt's wife reports pt has been out of his [place] for about 7-10 days. She notes his BG levels used to range from 120-125 and his numbers since stopping the medication are now 160-320. Pt's spouse reports she wanted to wait until he saw Endocrinology but with his numbers being so elevated she is requesting refill. Pt is not currently symptomatic. This RN educated on new-worsening symtpoms, when to call back/seek emergent care. Pt's spouse verbalized understanding and agrees to plan. Forwarding to provider.    Copied from CRM [id number]. Topic: Clinical - Medication Refill >> Apr 10, 2023 12:52 PM Gurney Maxin H wrote: Most Recent Primary Care Visit:  Provider: Georgina Quint  Department: [facility]  Visit Type: OFFICE VISIT  Date: 07/10/2022  Medication: insulin degludec (TRESIBA FLEXTOUCH) 200 UNIT/ML FlexTouch Pen  Has the patient contacted their pharmacy? Yes (Agent: If no, request that the patient contact the pharmacy for the refill. If patient does not wish to contact the pharmacy document the reason why and proceed with request.) (Agent: If yes, when and what did the pharmacy advise?)  Is this the correct pharmacy for this prescription? Yes If no, delete pharmacy and type the correct one.  This is the patient's preferred pharmacy:  [id number] - [place] ([place]), [place] - [address] [place] ([place]) [place] [address] Phone: [phone] Fax: [phone]   Has the prescription been filled recently? No  Is the patient out of the medication? Yes  Has the patient been seen for an appointment in the last year OR does the patient have an upcoming appointment? Yes  Can we respond through MyChart? Yes  Agent: Please be advised that Rx refills may take up to 3 business days. We ask that you follow-up with your pharmacy. Reason for Disposition  [1] Caller has NON-URGENT medication or insulin pump question AND [2] triager unable to answer  question  Answer Assessment - Initial Assessment Questions 1. BLOOD GLUCOSE: "What is your blood glucose level?"      160-320 2. ONSET: "When did you check the blood glucose?"     Ongoing 3. USUAL RANGE: "What is your glucose level usually?" (e.g., usual fasting morning value, usual evening value)     120-125  5. TYPE 1 or 2:  "Do you know what type of diabetes you have?"  (e.g., Type 1, Type 2, Gestational; doesn't know)      Type 2 6. INSULIN: "Do you take insulin?" "What type of insulin(s) do you use? What is the mode of delivery? (syringe, pen; injection or pump)?"      Ihor Gully  Oral meds: Jardiance  8. OTHER SYMPTOMS: "Do you have any symptoms?" (e.g., fever, frequent urination, difficulty breathing, dizziness, weakness, vomiting)     None  Protocols used: Diabetes - High Blood Sugar-A-AH

## 2023-04-10 NOTE — Telephone Encounter (Signed)
Copied from CRM [id number]. Topic: Clinical - Medication Refill >> Apr 10, 2023 12:52 PM Gurney Maxin H wrote: Most Recent Primary Care Visit:  Provider: Georgina Quint  Department: [facility]  Visit Type: OFFICE VISIT  Date: 07/10/2022  Medication: ***  Has the patient contacted their pharmacy?  (Agent: If no, request that the patient contact the pharmacy for the refill. If patient does not wish to contact the pharmacy document the reason why and proceed with request.) (Agent: If yes, when and what did the pharmacy advise?)  Is this the correct pharmacy for this prescription?  If no, delete pharmacy and type the correct one.  This is the patient's preferred pharmacy:  [id number] - [place] ([place]), [place] - [address] [place] ([place]) [place] [address] Phone: [phone] Fax: [phone]   Has the prescription been filled recently?   Is the patient out of the medication?   Has the patient been seen for an appointment in the last year OR does the patient have an upcoming appointment?   Can we respond through MyChart?   Agent: Please be advised that Rx refills may take up to 3 business days. We ask that you follow-up with your pharmacy.

## 2023-04-16 ENCOUNTER — Other Ambulatory Visit: Payer: Self-pay | Admitting: Bariatrics

## 2023-04-16 DIAGNOSIS — E559 Vitamin D deficiency, unspecified: Secondary | ICD-10-CM

## 2023-04-22 ENCOUNTER — Encounter: Payer: Self-pay | Admitting: Endocrinology

## 2023-04-22 ENCOUNTER — Ambulatory Visit (INDEPENDENT_AMBULATORY_CARE_PROVIDER_SITE_OTHER): Payer: 59 | Admitting: Endocrinology

## 2023-04-22 VITALS — BP 122/80 | HR 84 | Resp 20 | Ht 68.0 in | Wt 232.2 lb

## 2023-04-22 DIAGNOSIS — E1165 Type 2 diabetes mellitus with hyperglycemia: Secondary | ICD-10-CM

## 2023-04-22 DIAGNOSIS — E113413 Type 2 diabetes mellitus with severe nonproliferative diabetic retinopathy with macular edema, bilateral: Secondary | ICD-10-CM | POA: Diagnosis not present

## 2023-04-22 DIAGNOSIS — Z794 Long term (current) use of insulin: Secondary | ICD-10-CM | POA: Diagnosis not present

## 2023-04-22 LAB — POCT GLYCOSYLATED HEMOGLOBIN (HGB A1C): Hemoglobin A1C: 7.8 % — AB (ref 4.0–5.6)

## 2023-04-22 MED ORDER — EMPAGLIFLOZIN 25 MG PO TABS: 25.0000 mg | ORAL_TABLET | Freq: Every day | ORAL | 3 refills | Status: AC

## 2023-04-22 MED ORDER — TIRZEPATIDE 10 MG/0.5ML ~~LOC~~ SOAJ: 10.0000 mg | SUBCUTANEOUS | 4 refills | Status: DC

## 2023-04-22 MED ORDER — TRESIBA FLEXTOUCH 200 UNIT/ML ~~LOC~~ SOPN: 40.0000 [IU] | PEN_INJECTOR | Freq: Every day | SUBCUTANEOUS | 6 refills | Status: AC

## 2023-04-22 NOTE — Patient Instructions (Signed)
Latest Reference Range & Units 03/06/22 16:24 07/10/22 16:26 01/07/23 09:04 04/22/23 08:58  Hemoglobin A1C 4.0 - 5.6 % 10.9 ! Pend 9.4 ! 8.6 (H) 7.8 !  !: Data is abnormal (H): Data is abnormally high  Increase Mounjaro to 10 mg weekly.

## 2023-04-22 NOTE — Progress Notes (Signed)
Outpatient Endocrinology Note [place] Julieth Tugman, MD   Patient's Name: Aaron Woods    DOB: 05-Dec-1985    MRN: [id number]                                                    REASON OF VISIT: Follow up for type 2 diabetes mellitus  REFERRING PROVIDER: Georgina Quint, MD  PCP: Georgina Quint, MD  HISTORY OF PRESENT ILLNESS:   Aaron Woods is a 38 y.o. old male with past medical history listed below, is here for type 2 diabetes mellitus.   Pertinent Diabetes History: Patient was previously seen by Dr. Lonzo Cloud, was last time seen in December 2022.  Patient lost to follow-up to our clinic however he was following with outside ? endocrinology.  Patient referred to reestablish diabetes care in this clinic.  Patient was diagnosed with type 2 diabetes mellitus in 2006.  Lately he has been following with weight management clinic as well and has been on Mounjaro.  No personal history of pancreatitis and / or family history of medullary thyroid carcinoma or MEN 2B syndrome.   Chronic Diabetes Complications : Retinopathy: yes.  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 Shea Evans, last ophthalmology exam was done on every few months, following with ophthalmology regularly.  Nephropathy: no, on ACE/ARB /telmisartan. Peripheral neuropathy: no Coronary artery disease: no Stroke: no  Relevant comorbidities and cardiovascular risk factors: Obesity: yes Body mass index is 35.31 kg/m.  Hypertension: Yes  Hyperlipidemia : Yes, on statin   Current / Home Diabetic regimen includes:  Tresiba 40 units daily at bedtime. Jardiance 25 mg daily. Mounjaro 7.5mg  weekly.  Prior diabetic medications: Metformin GI intolerance/ headache.  Glipizide  Victoza, switch to [place] around [place] timeframe of 2024. He had taken Levemir with NovoLog /Humalog mix in the past.  Glycemic data:   He forgot to bring glucometer in the clinic today to review  glucose data.  He reports he has been checking blood sugar mostly in the morning blood sugar in the range of 120-130.  Hypoglycemia: Patient has no hypoglycemic episodes. Patient has hypoglycemia awareness  Factors modifying glucose control: 1.  Diabetic diet assessment: 3 meals a day.  2.  Staying active or exercising:   3.  Medication compliance: compliant all of the time.  Interval history  Patient presented to establish endocrinology/diabetes care in this clinic.  He used to follow-up in this clinic, was last time seen in December 2022.  He has also been following with weight management clinic, Victoza was switched to [facility] in around December 2024.  He has been taking Mounjaro 7.5 mg weekly, he has occasional acid reflux.  He mention he is able to lose about 5 pounds of weight after being on Mounjaro.  He denies numbness and tingling of the feet.  No vision problem.  No other complaints today.  Patient mentioned that he talked about his Mounjaro and diabetic retinopathy with his ophthalmologist, okayed to continue [facility].  Hemoglobin A1c today 7.8% and has been gradually improving.  REVIEW OF SYSTEMS As per history of present illness.   PAST MEDICAL HISTORY: Past Medical History:  Diagnosis Date   Back pain    Borderline high blood pressure    Diabetes 1.5, managed as type 2 (HCC)    Diabetes mellitus  Multiple food allergies    Sleep apnea     PAST SURGICAL HISTORY: History reviewed. No pertinent surgical history.  ALLERGIES: Allergies  Allergen Reactions   Almond (Diagnostic) Itching   Banana Itching    FAMILY HISTORY:  Family History  Problem Relation Age of Onset   Diabetes Mother    Diabetes Brother    Diabetes Brother    Diabetes Brother    Diabetes Maternal Grandmother     SOCIAL HISTORY: Social History   Socioeconomic History   Marital status: Married    Spouse name: [name]   Number of children: 3   Years of education: Not on file    Highest education level: High school graduate  Occupational History   Not on file  Tobacco Use   Smoking status: Never   Smokeless tobacco: Never  Substance and Sexual Activity   Alcohol use: No   Drug use: No   Sexual activity: Not on file  Other Topics Concern   Not on file  Social History Narrative   Live at home with wife and 3 kids   Right handed   Caffeine: 1 cup of tea in the AM   Social Drivers of Health   Financial Resource Strain: Not on file  Food Insecurity: Not on file  Transportation Needs: Not on file  Physical Activity: Not on file  Stress: Not on file  Social Connections: Not on file    MEDICATIONS:  Current Outpatient Medications  Medication Sig Dispense Refill   acetaminophen (TYLENOL) 325 MG tablet Take 650 mg by mouth every 6 (six) hours as needed for mild pain (pain score 1-3) or headache.     blood glucose meter kit and supplies by Other route as directed. Dispense based on patient and insurance preference. Use up to four times daily as directed. (FOR ICD-10 E10.9, E11.9).     Blood Glucose Monitoring Suppl (ONETOUCH VERIO FLEX SYSTEM) w/Device KIT USE  TO CHECK GLUCOSE ONCE DAILY IN THE AFTERNOON 1 kit 0   Insulin Pen Needle (B-D ULTRAFINE III SHORT PEN) 31G X 8 MM MISC USE 1 PEN NEEDLE AS DIRECTED 1-4  TIMES  DAILY 100 each 0   OneTouch Delica Lancets 30G MISC USE 1  TO CHECK GLUCOSE ONCE DAILY 100 each 0   ONETOUCH VERIO test strip USE 1 STRIP TO CHECK GLUCOSE ONCE DAILY IN  THE  AFTERNOON 100 each 0   rosuvastatin (CRESTOR) 20 MG tablet Take 1 tablet by mouth once daily 90 tablet 3   telmisartan-hydrochlorothiazide (MICARDIS HCT) 80-12.5 MG tablet Take 1 tablet by mouth daily. 90 tablet 0   tirzepatide (MOUNJARO) 10 MG/0.5ML Pen Inject 10 mg into the skin once a week. 6 mL 4   triamcinolone (NASACORT) 55 MCG/ACT AERO nasal inhaler Place 2 sprays into the nose daily. 1 each 12   Vitamin D, Ergocalciferol, (DRISDOL) 1.25 MG (50000 UNIT) CAPS capsule  Take 1 capsule (50,000 Units total) by mouth every 7 (seven) days. 5 capsule 0   empagliflozin (JARDIANCE) 25 MG TABS tablet Take 1 tablet (25 mg total) by mouth daily before breakfast. 90 tablet 3   insulin degludec (TRESIBA FLEXTOUCH) 200 UNIT/ML FlexTouch Pen Inject 40 Units into the skin daily in the afternoon. 15 mL 6   No current facility-administered medications for this visit.    PHYSICAL EXAM: Vitals:   04/22/23 0857  BP: 122/80  Pulse: 84  Resp: 20  SpO2: 97%  Weight: 232 lb 3.2 oz (105.3 kg)  Height: 5'  8" (1.727 m)   Body mass index is 35.31 kg/m.  Wt Readings from Last 3 Encounters:  04/22/23 232 lb 3.2 oz (105.3 kg)  03/12/23 227 lb (103 kg)  02/12/23 233 lb (105.7 kg)    General: Well developed, well nourished male in no apparent distress.  HEENT: AT/Olean, no external lesions.  Eyes: Conjunctiva clear and no icterus. Neck: Neck supple  Lungs: Respirations not labored Neurologic: Alert, oriented, normal speech Extremities / Skin: Dry. No sores or rashes noted.  Psychiatric: Does not appear depressed or anxious  Diabetic Foot Exam - Simple   Simple Foot Form Diabetic Foot exam was performed with the following findings: Yes 04/22/2023  9:25 AM  Visual Inspection No deformities, no ulcerations, no other skin breakdown bilaterally: Yes Sensation Testing Intact to touch and monofilament testing bilaterally: Yes Pulse Check Posterior Tibialis and Dorsalis pulse intact bilaterally: Yes Comments    LABS Reviewed Lab Results  Component Value Date   HGBA1C 7.8 (A) 04/22/2023   HGBA1C 8.6 (H) 01/07/2023   HGBA1C 9.4 (A) 07/10/2022   No results found for: "FRUCTOSAMINE" Lab Results  Component Value Date   CHOL 176 01/07/2023   HDL 42 01/07/2023   LDLCALC 115 (H) 01/07/2023   TRIG 102 01/07/2023   CHOLHDL 4 03/07/2022   Lab Results  Component Value Date   MICRALBCREAT <5 01/07/2023   MICRALBCREAT 1.6 03/07/2022   Lab Results  Component Value Date    CREATININE 1.34 (H) 01/07/2023   Lab Results  Component Value Date   GFR 70.88 03/07/2022    ASSESSMENT / PLAN  1. 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 Uncontrolled type 2 diabetes mellitus with hyperglycemia (HCC)     Diabetes Mellitus type 2, complicated by diabetic retinopathy. - Diabetic status / severity: Uncontrolled, improving.  Lab Results  Component Value Date   HGBA1C 7.8 (A) 04/22/2023    - Hemoglobin A1c goal : <6.5%  Discussed about type 2 diabetes mellitus and importance of controlling blood sugar to prevent chronic diabetic complications.  - Medications: See below.  I) continue Tresiba 40 units daily at bedtime.  If his blood sugar persistently below 100 in the morning fasting decreased to 35 units daily. II) increase Mounjaro from 7.5 to 10 mg weekly. III) continue Jardiance 25 mg daily.  - Home glucose testing: Check in the morning fasting and at bedtime.  Discussed about continuous glucose monitoring, patient is not interested at this time. - Discussed/ Gave Hypoglycemia treatment plan.  # Consult : not required at this time.   # Annual urine for microalbuminuria/ creatinine ratio, no microalbuminuria currently, continue ACE/ARB /telmisartan. Last  Lab Results  Component Value Date   MICRALBCREAT <5 01/07/2023    # Foot check nightly.  # He has diabetic retinopathy, following with ophthalmology/retina specialist.   - Diet: Make healthy diabetic food choices - Life style / activity / exercise: Discussed.  2. Blood pressure  -  BP Readings from Last 1 Encounters:  04/22/23 122/80    - Control is in target.  - No change in current plans.  3. Lipid status / Hyperlipidemia - Last  Lab Results  Component Value Date   LDLCALC 115 (H) 01/07/2023   - Continue rosuvastatin 20 mg daily, managed by primary care  provider.  Diagnoses and all orders for this visit:  Type 2 diabetes mellitus with both eyes affected by  severe nonproliferative retinopathy and macular edema, with long-term current use of insulin (HCC) -     POCT glycosylated hemoglobin (Hb A1C) -     tirzepatide (MOUNJARO) 10 MG/0.5ML Pen; Inject 10 mg into the skin once a week. -     empagliflozin (JARDIANCE) 25 MG TABS tablet; Take 1 tablet (25 mg total) by mouth daily before breakfast. -     insulin degludec (TRESIBA FLEXTOUCH) 200 UNIT/ML FlexTouch Pen; Inject 40 Units into the skin daily in the afternoon.  Uncontrolled type 2 diabetes mellitus with hyperglycemia (HCC)   DISPOSITION Follow up in clinic in 3 months suggested.   All questions answered and patient verbalized understanding of the plan.  [place] Summer Mccolgan, MD [facility] Endocrinology [facility] [address] [place], [address] [place], [place] [address] Phone # [phone]  At least part of this note was generated using voice recognition software. Inadvertent word errors may have occurred, which were not recognized during the proofreading process.

## 2023-04-29 ENCOUNTER — Other Ambulatory Visit: Payer: Self-pay | Admitting: Emergency Medicine

## 2023-05-09 ENCOUNTER — Other Ambulatory Visit: Payer: Self-pay | Admitting: Emergency Medicine

## 2023-05-18 ENCOUNTER — Ambulatory Visit: Payer: Medicaid Other | Admitting: Bariatrics

## 2023-06-03 ENCOUNTER — Other Ambulatory Visit: Payer: Self-pay | Admitting: Emergency Medicine

## 2023-06-23 ENCOUNTER — Telehealth: Payer: Self-pay | Admitting: Bariatrics

## 2023-06-23 NOTE — Telephone Encounter (Signed)
 Wife is calling because pt request to be seen in [place] by [place] because it is closer.

## 2023-07-02 ENCOUNTER — Other Ambulatory Visit: Payer: Self-pay | Admitting: Emergency Medicine

## 2023-07-02 DIAGNOSIS — G4733 Obstructive sleep apnea (adult) (pediatric): Secondary | ICD-10-CM | POA: Diagnosis not present

## 2023-07-07 ENCOUNTER — Ambulatory Visit: Payer: 59 | Admitting: Family Medicine

## 2023-07-11 ENCOUNTER — Other Ambulatory Visit: Payer: Self-pay | Admitting: Emergency Medicine

## 2023-07-20 ENCOUNTER — Ambulatory Visit: Admitting: Bariatrics

## 2023-07-21 ENCOUNTER — Ambulatory Visit: Payer: 59 | Admitting: Endocrinology

## 2023-07-23 ENCOUNTER — Encounter: Payer: Self-pay | Admitting: Endocrinology

## 2023-07-23 ENCOUNTER — Ambulatory Visit (INDEPENDENT_AMBULATORY_CARE_PROVIDER_SITE_OTHER): Admitting: Endocrinology

## 2023-07-23 VITALS — BP 150/100 | HR 79 | Resp 20 | Ht 68.0 in | Wt 236.0 lb

## 2023-07-23 DIAGNOSIS — Z794 Long term (current) use of insulin: Secondary | ICD-10-CM

## 2023-07-23 DIAGNOSIS — E113413 Type 2 diabetes mellitus with severe nonproliferative diabetic retinopathy with macular edema, bilateral: Secondary | ICD-10-CM

## 2023-07-23 LAB — POCT GLYCOSYLATED HEMOGLOBIN (HGB A1C): Hemoglobin A1C: 7.4 % — AB (ref 4.0–5.6)

## 2023-07-23 NOTE — Progress Notes (Signed)
 Outpatient Endocrinology Note [name], MD   Patient's Name: Aaron Woods    DOB: 09-08-1985    MRN: [id number]                                                    REASON OF VISIT: Follow up for type 2 diabetes mellitus  REFERRING PROVIDER: Elvira Hammersmith, MD  PCP: Elvira Hammersmith, MD  HISTORY OF PRESENT ILLNESS:   Aaron Woods is a 38 y.o. old male with past medical history listed below, is here for type 2 diabetes mellitus.   Pertinent Diabetes History: Patient was previously seen by Dr. Rosalea Collin, was last time seen in December 2022.  Patient lost to follow-up to our clinic however he was following with outside ? endocrinology.  Patient referred to reestablish diabetes care in this clinic.  Patient was diagnosed with type 2 diabetes mellitus in 2006.  Lately he has been following with weight management clinic as well and has been on Mounjaro.  No personal history of pancreatitis and / or family history of medullary thyroid  carcinoma or MEN 2B syndrome.   Chronic Diabetes Complications : Retinopathy: yes.  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 Alto Atta, last ophthalmology exam was done on every few months, following with ophthalmology regularly.  Nephropathy: no, on ACE/ARB /telmisartan . Peripheral neuropathy: no Coronary artery disease: no Stroke: no  Relevant comorbidities and cardiovascular risk factors: Obesity: yes Body mass index is 35.88 kg/m.  Hypertension: Yes  Hyperlipidemia : Yes, on statin   Current / Home Diabetic regimen includes:  Tresiba  40 units daily at bedtime. Jardiance  25 mg daily. Mounjaro 10 mg weekly.  Patient mentioned that he talked about his Mounjaro and diabetic retinopathy with his ophthalmologist, okayed to continue Mounjaro.  Prior diabetic medications: Metformin  GI intolerance/ headache.  Glipizide  Victoza , switch to Mounjaro around [place] timeframe of 2024. He had  taken Levemir  with NovoLog  /Humalog  mix in the past.  Glycemic data:   He forgot to bring glucometer in the clinic today to review glucose data.  He reports he has been checking blood sugar mostly in the morning blood sugar in the range of AM 150-160.  Hypoglycemia: Patient has no hypoglycemic episodes. Patient has hypoglycemia awareness  Factors modifying glucose control: 1.  Diabetic diet assessment: 3 meals a day.  2.  Staying active or exercising:   3.  Medication compliance: compliant all of the time.  Interval history  Hemoglobin A1c improved to 7.4%.  Increase Mounjaro to 10 mg weekly from last week.  Denies any GI issues.  Tolerating well.  He gained about 10 pounds of weight in last 3 months.  No glucose data to review.  He has been following with retina specialist.  Denies numbness and ting of the feet.  No other complaints today.  REVIEW OF SYSTEMS As per history of present illness.   PAST MEDICAL HISTORY: Past Medical History:  Diagnosis Date   Back pain    Borderline high blood pressure    Diabetes 1.5, managed as type 2 (HCC)    Diabetes mellitus    Multiple food allergies    Sleep apnea     PAST SURGICAL HISTORY: History reviewed. No pertinent surgical history.  ALLERGIES: Allergies  Allergen Reactions   Almond (Diagnostic) Itching   Banana Itching  FAMILY HISTORY:  Family History  Problem Relation Age of Onset   Diabetes Mother    Diabetes Brother    Diabetes Brother    Diabetes Brother    Diabetes Maternal Grandmother     SOCIAL HISTORY: Social History   Socioeconomic History   Marital status: Married    Spouse name: [name]   Number of children: 3   Years of education: Not on file   Highest education level: High school graduate  Occupational History   Not on file  Tobacco Use   Smoking status: Never   Smokeless tobacco: Never  Substance and Sexual Activity   Alcohol use: No   Drug use: No   Sexual activity: Not on file  Other  Topics Concern   Not on file  Social History Narrative   Live at home with wife and 3 kids   Right handed   Caffeine: 1 cup of tea in the AM   Social Drivers of Health   Financial Resource Strain: Not on file  Food Insecurity: Not on file  Transportation Needs: Not on file  Physical Activity: Not on file  Stress: Not on file  Social Connections: Not on file    MEDICATIONS:  Current Outpatient Medications  Medication Sig Dispense Refill   acetaminophen  (TYLENOL ) 325 MG tablet Take 650 mg by mouth every 6 (six) hours as needed for mild pain (pain score 1-3) or headache.     B-D ULTRAFINE III SHORT PEN 31G X 8 MM MISC USE 1 PEN NEEDLE AS DIRECTED 1-4  TIMES  DAILY 100 each 0   blood glucose meter kit and supplies by Other route as directed. Dispense based on patient and insurance preference. Use up to four times daily as directed. (FOR ICD-10 E10.9, E11.9).     Blood Glucose Monitoring Suppl (ONETOUCH VERIO FLEX SYSTEM) w/Device KIT USE  TO CHECK GLUCOSE ONCE DAILY IN THE AFTERNOON 1 kit 0   empagliflozin  (JARDIANCE ) 25 MG TABS tablet Take 1 tablet (25 mg total) by mouth daily before breakfast. 90 tablet 3   insulin  degludec (TRESIBA  FLEXTOUCH) 200 UNIT/ML FlexTouch Pen Inject 40 Units into the skin daily in the afternoon. 15 mL 6   OneTouch Delica Lancets 30G MISC USE 1  TO CHECK GLUCOSE ONCE DAILY 100 each 0   ONETOUCH VERIO test strip USE 1 STRIP TO CHECK GLUCOSE ONCE DAILY IN  THE  AFTERNOON 100 each 0   rosuvastatin  (CRESTOR ) 20 MG tablet Take 1 tablet by mouth once daily 30 tablet 0   telmisartan -hydrochlorothiazide (MICARDIS  HCT) 80-12.5 MG tablet Take 1 tablet by mouth daily. 90 tablet 0   tirzepatide (MOUNJARO) 10 MG/0.5ML Pen Inject 10 mg into the skin once a week. 6 mL 4   triamcinolone  (NASACORT ) 55 MCG/ACT AERO nasal inhaler Place 2 sprays into the nose daily. 1 each 12   Vitamin D , Ergocalciferol , (DRISDOL ) 1.25 MG (50000 UNIT) CAPS capsule Take 1 capsule (50,000 Units  total) by mouth every 7 (seven) days. 5 capsule 0   No current facility-administered medications for this visit.    PHYSICAL EXAM: Vitals:   07/23/23 1323 07/23/23 1324  BP: (!) 142/100 (!) 150/100  Pulse: 79   Resp: 20   SpO2: 96%   Weight: 236 lb (107 kg)   Height: 5\' 8"  (1.727 m)     Body mass index is 35.88 kg/m.  Wt Readings from Last 3 Encounters:  07/23/23 236 lb (107 kg)  04/22/23 232 lb 3.2 oz (105.3 kg)  03/12/23 227 lb (103 kg)    General: Well developed, well nourished male in no apparent distress.  HEENT: AT/[place], no external lesions.  Eyes: Conjunctiva clear and no icterus. Neck: Neck supple  Lungs: Respirations not labored Neurologic: Alert, oriented, normal speech Extremities / Skin: Dry. No sores or rashes noted.  Psychiatric: Does not appear depressed or anxious  Diabetic Foot Exam - Simple   No data filed    LABS Reviewed Lab Results  Component Value Date   HGBA1C 7.4 (A) 07/23/2023   HGBA1C 7.8 (A) 04/22/2023   HGBA1C 8.6 (H) 01/07/2023   No results found for: "FRUCTOSAMINE" Lab Results  Component Value Date   CHOL 176 01/07/2023   HDL 42 01/07/2023   LDLCALC 115 (H) 01/07/2023   TRIG 102 01/07/2023   CHOLHDL 4 03/07/2022   Lab Results  Component Value Date   MICRALBCREAT <5 01/07/2023   MICRALBCREAT 1.6 03/07/2022   Lab Results  Component Value Date   CREATININE 1.34 (H) 01/07/2023   Lab Results  Component Value Date   GFR 70.88 03/07/2022    ASSESSMENT / PLAN  1. 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 - Diabetic status / severity: Uncontrolled, improving.  Lab Results  Component Value Date   HGBA1C 7.4 (A) 07/23/2023    - Hemoglobin A1c goal : <6.5%  Discussed about type 2 diabetes mellitus and importance of controlling blood sugar to  prevent chronic diabetic complications.  He has been on higher dose of Mounjaro 10 mg weekly for last 1 week.  - Medications: See below.  No change this time.  I) continue Tresiba  40 units daily at bedtime.  If his blood sugar persistently below 100 in the morning fasting decreased to 35 units daily. II) continue Mounjaro 10 mg weekly.  III) continue Jardiance  25 mg daily.  Patient is asked to call our clinic in between the visits if he feels comfortable dose of Mounjaro can be increased in between the visits.  - Home glucose testing: Check in the morning fasting and at bedtime.  Discussed about continuous glucose monitoring, patient is not interested at this time.  Asked to bring glucometer in the follow-up visit.  - Discussed/ Gave Hypoglycemia treatment plan.  # Consult : not required at this time.   # Annual urine for microalbuminuria/ creatinine ratio, no microalbuminuria currently, continue ACE/ARB /telmisartan . Last  Lab Results  Component Value Date   MICRALBCREAT <5 01/07/2023    # Foot check nightly.  # He has diabetic retinopathy, following with ophthalmology/retina specialist.   - Diet: Make healthy diabetic food choices - Life style / activity / exercise: Discussed.  2. Blood pressure  -  BP Readings from Last 1 Encounters:  07/23/23 (!) 150/100    - Control is not in target. Patient is asked to monitor blood pressure at home and if remains high asked to discuss with primary care provider.  He has blood pressure machine at home. - No change in current plans.  3. Lipid status / Hyperlipidemia - Last  Lab Results  Component Value Date   LDLCALC 115 (H) 01/07/2023   - Continue rosuvastatin  20 mg daily, managed by primary care provider.  Diagnoses and all orders for this visit:  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, with long-term current  use of insulin  (HCC) -     POCT glycosylated  hemoglobin (Hb A1C)    DISPOSITION Follow up in clinic in 4 months suggested.   All questions answered and patient verbalized understanding of the plan.  [place] Taquila Leys, MD [facility] [address] [place], [address] [place], [place] [address] Phone # [phone]  At least part of this note was generated using voice recognition software. Inadvertent word errors may have occurred, which were not recognized during the proofreading process.

## 2023-07-23 NOTE — Patient Instructions (Signed)
 Latest Reference Range & Units 07/10/22 16:26 01/07/23 09:04 04/22/23 08:58 07/23/23 13:29  Hemoglobin A1C 4.0 - 5.6 % -  9.4 ! 8.6 (H) 7.8 ! Pend 7.4 ! Pend  !: Data is abnormal (H): Data is abnormally high

## 2023-07-30 ENCOUNTER — Ambulatory Visit (INDEPENDENT_AMBULATORY_CARE_PROVIDER_SITE_OTHER): Admitting: Adult Health

## 2023-07-30 ENCOUNTER — Encounter (INDEPENDENT_AMBULATORY_CARE_PROVIDER_SITE_OTHER): Payer: Self-pay | Admitting: Adult Health

## 2023-07-30 VITALS — BP 138/75 | HR 82 | Temp 98.2°F | Ht 68.0 in | Wt 227.0 lb

## 2023-07-30 DIAGNOSIS — I152 Hypertension secondary to endocrine disorders: Secondary | ICD-10-CM

## 2023-07-30 DIAGNOSIS — G4733 Obstructive sleep apnea (adult) (pediatric): Secondary | ICD-10-CM

## 2023-07-30 DIAGNOSIS — Z794 Long term (current) use of insulin: Secondary | ICD-10-CM

## 2023-07-30 DIAGNOSIS — Z6836 Body mass index (BMI) 36.0-36.9, adult: Secondary | ICD-10-CM

## 2023-07-30 DIAGNOSIS — E559 Vitamin D deficiency, unspecified: Secondary | ICD-10-CM

## 2023-07-30 DIAGNOSIS — Z7985 Long-term (current) use of injectable non-insulin antidiabetic drugs: Secondary | ICD-10-CM

## 2023-07-30 DIAGNOSIS — E1159 Type 2 diabetes mellitus with other circulatory complications: Secondary | ICD-10-CM | POA: Diagnosis not present

## 2023-07-30 DIAGNOSIS — E113413 Type 2 diabetes mellitus with severe nonproliferative diabetic retinopathy with macular edema, bilateral: Secondary | ICD-10-CM

## 2023-07-30 DIAGNOSIS — E786 Lipoprotein deficiency: Secondary | ICD-10-CM

## 2023-07-30 MED ORDER — VITAMIN D (ERGOCALCIFEROL) 1.25 MG (50000 UNIT) PO CAPS: 50000.0000 [IU] | ORAL_CAPSULE | ORAL | 0 refills | Status: DC

## 2023-07-30 NOTE — Progress Notes (Signed)
 WEIGHT SUMMARY AND BIOMETRICS  Vitals Temp: 98.2 F (36.8 C) BP: 138/75 Pulse Rate: 82 SpO2: 97 %   Anthropometric Measurements Height: 5\' 8"  (1.727 m) Weight: 227 lb (103 kg) BMI (Calculated): 34.52 Weight at Last Visit: 227 lb Weight Lost Since Last Visit: 0 Weight Gained Since Last Visit: 12 lb Starting Weight: 232 lb Total Weight Loss (lbs): 0 lb (0 kg)   Body Composition  Body Fat %: 33.4 % Fat Mass (lbs): 80 lbs Muscle Mass (lbs): 151.6 lbs Total Body Water (lbs): 119 lbs Visceral Fat Rating : 17   Other Clinical Data Fasting: no Labs: no Today's Visit #: 5 Starting Date: 01/07/23    Chief Complaint:   OBESITY Aaron Woods is here to discuss his progress with his obesity treatment plan.  He is on the the Category 3 Plan and states he is following his eating plan approximately 0 % of the time.  He states he is exercising Walking/Basketball 30/60 minutes 3/1 times per week.  Interim History:  Last OV at [facility] was with Dr. Alfredia Annas on 03/12/2023 He has been seen by his PCP and Endocrinologist multiple times the last 5 months. He would like to resume healthy eating and continue regular exercise. He was previously on Cat 3 MP  His A1c has been steadily improving and he hopes to reduce to stop Tresiba    Exercise-Walking and Basketball 3/1 times respectively per week. Hydration-He estimates to drink 80-120 oz water/day  He lives with his wife and children: 87, 70, 30- all boys Boys are very active in year round sports  Subjective:   1. OSA (obstructive sleep apnea) He reports compliance with nightly CPAP He endorses chronic fatigue, often sleeps 4-5 hours night.  2. Hypertension associated with diabetes (HCC) BP stable at OV He denies CP with exertion He is currently on Micradis 80/12.5mg  daily   3. 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 Denies mass in neck, dysphagia, dyspepsia, persistent hoarseness, abdominal pain, or N/V/C    4. Low HDL (under 40) Lipid Panel     Component Value Date/Time   CHOL 176 01/07/2023 0904   TRIG 102 01/07/2023 0904   HDL 42 01/07/2023 0904   CHOLHDL 4 03/07/2022 0807   VLDL 37.0 03/07/2022 0807   LDLCALC 115 (H) 01/07/2023 0904   LABVLDL 19 01/07/2023 0904    LDL above goal for diabetic He is on daily Crestor  20mg   5. Vitamin D  deficiency He has been off ergocalciferol  for months. He endorses chronic fatigue, often sleeps 4-5 hours night (aided by CPAP)  Assessment/Plan:   1. OSA (obstructive sleep apnea) Continue with weight loss efforts Continue nightly CPAP  2. Hypertension associated with diabetes (HCC) (Primary) Resume Cat 3 MP Continue regular exercise Continue combo BP Rx  3. 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 Low HDL (under 40) Resume Cat 3 MP Continue regular exercise  5. Vitamin D  deficiency Restart - Vitamin D , Ergocalciferol , (DRISDOL ) 1.25 MG (50000 UNIT) CAPS capsule; Take 1 capsule (50,000 Units total) by mouth every 7 (seven) days.  Dispense: 5 capsule; Refill: 0  6. Morbid obesity (HCC), CURRENT BMI 36.4  Aaron Woods is not currently in the action stage of change. As such, his goal is to get back to weightloss efforts . He has agreed  to the Category 3 Plan.   Exercise goals: For substantial health benefits, adults should do at least 150 minutes (2 hours and 30 minutes) a week of moderate-intensity, or 75 minutes (1 hour and 15 minutes) a week of vigorous-intensity aerobic physical activity, or an equivalent combination of moderate- and vigorous-intensity aerobic activity. Aerobic activity should be performed in episodes of at least 10 minutes, and preferably, it should be spread throughout the week.  Behavioral modification strategies: increasing lean protein intake, decreasing simple carbohydrates, increasing vegetables, increasing water intake, decreasing eating out, no skipping meals, meal planning and cooking strategies, keeping healthy foods in the home, and planning for success.  Aaron Woods has agreed to follow-up with our clinic in 4 weeks. He was informed of the importance of frequent follow-up visits to maximize his success with intensive lifestyle modifications for his multiple health conditions.   Check Fasting Labs at next OV  Objective:   Blood pressure 138/75, pulse 82, temperature 98.2 F (36.8 C), height 5\' 8"  (1.727 m), weight 227 lb (103 kg), SpO2 97%. Body mass index is 34.52 kg/m.  General: Cooperative, alert, well developed, in no acute distress. HEENT: Conjunctivae and lids unremarkable. Cardiovascular: Regular rhythm.  Lungs: Normal work of breathing. Neurologic: No focal deficits.   Lab Results  Component Value Date   CREATININE 1.34 (H) 01/07/2023   BUN 24 (H) 01/07/2023   NA 142 01/07/2023   K 4.4 01/07/2023   CL 104 01/07/2023   CO2 25 01/07/2023   Lab Results  Component Value Date   ALT 37 01/07/2023   AST 22 01/07/2023   ALKPHOS 88 01/07/2023   BILITOT 0.3 01/07/2023   Lab Results  Component Value Date   HGBA1C 7.4 (A) 07/23/2023   HGBA1C 7.8 (A) 04/22/2023   HGBA1C 8.6 (H) 01/07/2023   HGBA1C 9.4 (A) 07/10/2022   HGBA1C 10.9 (A) 03/06/2022   Lab Results  Component Value  Date   INSULIN  9.9 01/07/2023   Lab Results  Component Value Date   TSH 2.230 01/07/2023   Lab Results  Component Value Date   CHOL 176 01/07/2023   HDL 42 01/07/2023   LDLCALC 115 (H) 01/07/2023   TRIG 102 01/07/2023   CHOLHDL 4 03/07/2022   Lab Results  Component Value Date   VD25OH 21.7 (L) 01/07/2023   Lab Results  Component Value Date   WBC 6.8 03/07/2022   HGB 12.5 (L) 03/07/2022   HCT 39.0 03/07/2022   MCV 76.5 (L) 03/07/2022   PLT 309.0 03/07/2022   Lab Results  Component Value Date   IRON 21 (L) 12/02/2019   TIBC 271 12/02/2019   FERRITIN 814 (H) 12/02/2019   Attestation Statements:   Reviewed by clinician on day of visit: allergies, medications, problem list, medical history, surgical history, family history, social history, and previous encounter notes.  I have reviewed the above documentation for accuracy and completeness, and I agree with the above. -  Leticia Coletta d. Clyde Zarrella, NP-C

## 2023-07-31 ENCOUNTER — Other Ambulatory Visit: Payer: Self-pay | Admitting: Emergency Medicine

## 2023-08-12 ENCOUNTER — Other Ambulatory Visit: Payer: Self-pay | Admitting: Emergency Medicine

## 2023-08-23 ENCOUNTER — Other Ambulatory Visit: Payer: Self-pay | Admitting: Emergency Medicine

## 2023-08-26 ENCOUNTER — Other Ambulatory Visit: Payer: Self-pay | Admitting: Bariatrics

## 2023-08-26 DIAGNOSIS — E559 Vitamin D deficiency, unspecified: Secondary | ICD-10-CM

## 2023-09-14 ENCOUNTER — Other Ambulatory Visit: Payer: Self-pay | Admitting: Emergency Medicine

## 2023-09-14 ENCOUNTER — Ambulatory Visit (INDEPENDENT_AMBULATORY_CARE_PROVIDER_SITE_OTHER): Admitting: Adult Health

## 2023-09-14 ENCOUNTER — Encounter (INDEPENDENT_AMBULATORY_CARE_PROVIDER_SITE_OTHER): Payer: Self-pay | Admitting: Adult Health

## 2023-09-14 VITALS — BP 107/64 | HR 87 | Temp 99.2°F | Ht 68.0 in | Wt 234.0 lb

## 2023-09-14 DIAGNOSIS — E113413 Type 2 diabetes mellitus with severe nonproliferative diabetic retinopathy with macular edema, bilateral: Secondary | ICD-10-CM | POA: Diagnosis not present

## 2023-09-14 DIAGNOSIS — E559 Vitamin D deficiency, unspecified: Secondary | ICD-10-CM | POA: Diagnosis not present

## 2023-09-14 DIAGNOSIS — E1159 Type 2 diabetes mellitus with other circulatory complications: Secondary | ICD-10-CM

## 2023-09-14 DIAGNOSIS — Z794 Long term (current) use of insulin: Secondary | ICD-10-CM | POA: Diagnosis not present

## 2023-09-14 DIAGNOSIS — E786 Lipoprotein deficiency: Secondary | ICD-10-CM

## 2023-09-14 DIAGNOSIS — Z7985 Long-term (current) use of injectable non-insulin antidiabetic drugs: Secondary | ICD-10-CM | POA: Diagnosis not present

## 2023-09-14 DIAGNOSIS — I152 Hypertension secondary to endocrine disorders: Secondary | ICD-10-CM | POA: Diagnosis not present

## 2023-09-14 DIAGNOSIS — Z6836 Body mass index (BMI) 36.0-36.9, adult: Secondary | ICD-10-CM | POA: Diagnosis not present

## 2023-09-14 DIAGNOSIS — Z7984 Long term (current) use of oral hypoglycemic drugs: Secondary | ICD-10-CM | POA: Diagnosis not present

## 2023-09-14 MED ORDER — VITAMIN D (ERGOCALCIFEROL) 1.25 MG (50000 UNIT) PO CAPS: 50000.0000 [IU] | ORAL_CAPSULE | ORAL | 0 refills | Status: AC

## 2023-09-14 NOTE — Progress Notes (Signed)
 WEIGHT SUMMARY AND BIOMETRICS  Vitals Temp: 99.2 F (37.3 C) BP: 107/64 Pulse Rate: 87 SpO2: 98 %   Anthropometric Measurements Height: 5' 8 (1.727 m) Weight: 234 lb (106.1 kg) BMI (Calculated): 35.59 Weight at Last Visit: 227 lb Weight Lost Since Last Visit: 0 Weight Gained Since Last Visit: 7 lb Starting Weight: 232 lb Total Weight Loss (lbs): 0 lb (0 kg)   Body Composition  Body Fat %: 31.8 % Fat Mass (lbs): 74.4 lbs Muscle Mass (lbs): 152 lbs Total Body Water (lbs): 116.8 lbs Visceral Fat Rating : 15   Other Clinical Data Fasting: no Labs: yes Today's Visit #: 6 Starting Date: 01/07/23    Chief Complaint:   OBESITY Aaron Woods is here to discuss his progress with his obesity treatment plan.  He is on the the Category 3 Plan and states he is following his eating plan approximately 30 % of the time.  He states he is exercising Basketball/Walking 120/45 minutes 1/7 times per week.  Interim History:  Unable to complete fasting labs at OV- he is agreeable to completing at next OV  He reports fasting have been running 130 or less He denies sx's of hypoglycemia  Reviewed Bioimpedance results with pt: Muscle Mass: +0.4 lb Adipose Mass: -5.6 lbs  Visceral Adipose Rating decreased from 17 to 15  Subjective:   1. Low HDL (under 40) Lipid Panel     Component Value Date/Time   CHOL 176 01/07/2023 0904   TRIG 102 01/07/2023 0904   HDL 42 01/07/2023 0904   CHOLHDL 4 03/07/2022 0807   VLDL 37.0 03/07/2022 0807   LDLCALC 115 (H) 01/07/2023 0904   LABVLDL 19 01/07/2023 0904    Endocrinology manages daily Crestor  20mg  LDL well above goal of 70  2. Vitamin D  deficiency  Latest Reference Range & Units 01/07/23 09:04  Vitamin D , 25-Hydroxy 30.0 - 100.0 ng/mL 21.7 (L)  (L): Data is abnormally low  She is on weekly Ergocalciferol - denies N/V/Muscle Weakness  3. Type 2 diabetes mellitus with both eyes affected by severe nonproliferative retinopathy and  macular edema, with long-term current use of insulin  (HCC) He reports fasting CBG <140 and has not been regularly checking PP CBG He is currently on      empagliflozin  (JARDIANCE ) 25 MG TABS tablet- managed by ENDOCRINOLOGY  insulin  degludec (TRESIBA  FLEXTOUCH) 200 UNIT/ML FlexTouch Pen- managed by ENDOCRINOLOGY     tirzepatide  (MOUNJARO ) 10 MG/0.5ML Pen-  managed by ENDOCRINOLOGY   Per pt Metformin  intolerant, ie: Loose stools   07/23/2023 Endocrinology OV Notes  I) continue Tresiba  40 units daily at bedtime.  If his blood sugar persistently below 100 in the morning fasting decreased to 35 units daily. II) continue Mounjaro  10 mg weekly.  III) continue Jardiance  25 mg daily.  He reports fasting CBG 130 or less He denies sx's of hypoglycemia  4. Hypertension associated with diabetes (HCC) BP at goal at OV He denies CP when playing basketball He is on telmisartan -hydrochlorothiazide (MICARDIS  HCT) 80-12.5 MG tablet  empagliflozin  (JARDIANCE ) 25 MG TABS tablet  insulin  degludec (TRESIBA  FLEXTOUCH) 200 UNIT/ML FlexTouch Pen  tirzepatide  (MOUNJARO ) 10 MG/0.5ML Pen  rosuvastatin  (CRESTOR ) 20 MG tablet   Assessment/Plan:   1. Low HDL (under 40) (Primary) Increase regular cardiovascular exercise Reduce sat fat intkae  2. Vitamin D  deficiency Refill Vitamin D , Ergocalciferol , (DRISDOL ) 1.25 MG (50000 UNIT) CAPS capsule Take 1 capsule (50,000 Units total) by mouth every 7 (seven) days. Dispense: 5 capsule, Refills: 0 ordered  3. 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 Hypertension associated with diabetes (HCC) Continue regular exercise Continu telmisartan -hydrochlorothiazide (MICARDIS  HCT) 80-12.5 MG tablet  empagliflozin  (JARDIANCE ) 25 MG TABS tablet  insulin  degludec (TRESIBA  FLEXTOUCH) 200 UNIT/ML FlexTouch Pen  tirzepatide  (MOUNJARO ) 10  MG/0.5ML Pen  rosuvastatin  (CRESTOR ) 20 MG tablet   5. Morbid obesity (HCC), CURRENT BMI 36.4  Aaron Woods is currently in the action stage of change. As such, his goal is to continue with weight loss efforts. He has agreed to the Category 3 Plan.   Exercise goals: For substantial health benefits, adults should do at least 150 minutes (2 hours and 30 minutes) a week of moderate-intensity, or 75 minutes (1 hour and 15 minutes) a week of vigorous-intensity aerobic physical activity, or an equivalent combination of moderate- and vigorous-intensity aerobic activity. Aerobic activity should be performed in episodes of at least 10 minutes, and preferably, it should be spread throughout the week.  Behavioral modification strategies: increasing lean protein intake, decreasing simple carbohydrates, increasing vegetables, increasing water intake, no skipping meals, meal planning and cooking strategies, keeping healthy foods in the home, and planning for success.  Aaron Woods has agreed to follow-up with our clinic in 4 weeks. He was informed of the importance of frequent follow-up visits to maximize his success with intensive lifestyle modifications for his multiple health conditions.   Check Fasting Labs at next OV  Objective:   Blood pressure 107/64, pulse 87, temperature 99.2 F (37.3 C), height 5' 8 (1.727 m), weight 234 lb (106.1 kg), SpO2 98%. Body mass index is 35.58 kg/m.  General: Cooperative, alert, well developed, in no acute distress. HEENT: Conjunctivae and lids unremarkable. Cardiovascular: Regular rhythm.  Lungs: Normal work of breathing. Neurologic: No focal deficits.   Lab Results  Component Value Date   CREATININE 1.34 (H) 01/07/2023   BUN 24 (H) 01/07/2023   NA 142 01/07/2023   K 4.4 01/07/2023   CL 104 01/07/2023   CO2 25 01/07/2023   Lab Results  Component Value Date   ALT 37 01/07/2023   AST 22 01/07/2023   ALKPHOS 88 01/07/2023   BILITOT 0.3 01/07/2023   Lab Results   Component Value Date   HGBA1C 7.4 (A) 07/23/2023   HGBA1C 7.8 (A) 04/22/2023   HGBA1C 8.6 (H) 01/07/2023   HGBA1C 9.4 (A) 07/10/2022   HGBA1C 10.9 (A) 03/06/2022   Lab Results  Component Value Date   INSULIN  9.9 01/07/2023   Lab Results  Component Value Date   TSH 2.230 01/07/2023   Lab Results  Component Value Date   CHOL 176 01/07/2023   HDL 42 01/07/2023   LDLCALC 115 (H) 01/07/2023   TRIG 102 01/07/2023   CHOLHDL 4 03/07/2022   Lab Results  Component Value Date   VD25OH 21.7 (L) 01/07/2023   Lab Results  Component Value Date   WBC 6.8 03/07/2022   HGB 12.5 (L) 03/07/2022   HCT 39.0 03/07/2022   MCV 76.5 (L) 03/07/2022   PLT 309.0 03/07/2022   Lab Results  Component Value Date   IRON 21 (L) 12/02/2019   TIBC 271 12/02/2019   FERRITIN 814 (H) 12/02/2019   Attestation Statements:   Reviewed by clinician on day of visit: allergies, medications, problem list, medical history, surgical history, family history, social history, and previous encounter notes.  I have reviewed the above documentation for accuracy and completeness, and I  agree with the above. -  Anthone Prieur d. Markeis Allman, NP-C

## 2023-09-25 ENCOUNTER — Other Ambulatory Visit: Payer: Self-pay | Admitting: Emergency Medicine

## 2023-10-08 ENCOUNTER — Other Ambulatory Visit: Payer: Self-pay | Admitting: Bariatrics

## 2023-10-08 DIAGNOSIS — E559 Vitamin D deficiency, unspecified: Secondary | ICD-10-CM

## 2023-10-20 ENCOUNTER — Ambulatory Visit (INDEPENDENT_AMBULATORY_CARE_PROVIDER_SITE_OTHER): Admitting: Adult Health

## 2023-11-02 ENCOUNTER — Other Ambulatory Visit: Payer: Self-pay | Admitting: Emergency Medicine

## 2023-11-04 ENCOUNTER — Other Ambulatory Visit: Payer: Self-pay | Admitting: Emergency Medicine

## 2023-11-04 ENCOUNTER — Other Ambulatory Visit: Payer: Self-pay | Admitting: Endocrinology

## 2023-11-04 ENCOUNTER — Telehealth: Payer: Self-pay | Admitting: Neurology

## 2023-11-04 NOTE — Telephone Encounter (Signed)
 Refill request complete

## 2023-11-04 NOTE — Telephone Encounter (Signed)
 Pt has been scheduled for his 1 yr f/u

## 2023-11-24 ENCOUNTER — Ambulatory Visit (INDEPENDENT_AMBULATORY_CARE_PROVIDER_SITE_OTHER): Admitting: Endocrinology

## 2023-11-24 ENCOUNTER — Ambulatory Visit: Payer: Self-pay | Admitting: Endocrinology

## 2023-11-24 ENCOUNTER — Encounter: Payer: Self-pay | Admitting: Endocrinology

## 2023-11-24 VITALS — BP 112/70 | HR 75 | Resp 20 | Ht 68.0 in | Wt 236.0 lb

## 2023-11-24 DIAGNOSIS — E113413 Type 2 diabetes mellitus with severe nonproliferative diabetic retinopathy with macular edema, bilateral: Secondary | ICD-10-CM | POA: Diagnosis not present

## 2023-11-24 DIAGNOSIS — Z794 Long term (current) use of insulin: Secondary | ICD-10-CM | POA: Diagnosis not present

## 2023-11-24 LAB — POCT GLYCOSYLATED HEMOGLOBIN (HGB A1C): Hemoglobin A1C: 7.7 % — AB (ref 4.0–5.6)

## 2023-11-24 MED ORDER — TIRZEPATIDE 12.5 MG/0.5ML ~~LOC~~ SOAJ: 12.5000 mg | SUBCUTANEOUS | 11 refills | Status: AC

## 2023-11-24 NOTE — Progress Notes (Signed)
 Outpatient Endocrinology Note Aaron Allysen Lazo, MD   Patient's Name: Aaron Woods    DOB: 05-15-1985    MRN: [id number]                                                    REASON OF VISIT: Follow up for type 2 diabetes mellitus  REFERRING PROVIDER: Purcell [name], MD  PCP: Purcell [name], MD  HISTORY OF PRESENT ILLNESS:   Aaron Woods is a 38 y.o. old male with past medical history listed below, is here for type 2 diabetes mellitus.   Pertinent Diabetes History: Patient was previously seen by Dr. Sam, was last time seen in December 2022.  Patient lost to follow-up to our clinic however he was following with outside ? endocrinology.  Patient referred to reestablish diabetes care in this clinic.  Patient was diagnosed with type 2 diabetes mellitus in 2006.  Lately he has been following with weight management clinic as well and has been on Mounjaro .  No personal history of pancreatitis and / or family history of medullary thyroid  carcinoma or MEN 2B syndrome.   Chronic Diabetes Complications : Retinopathy: yes.  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 Abigail, last ophthalmology exam was done on every few months, following with ophthalmology regularly.  Nephropathy: no, on ACE/ARB /telmisartan . Peripheral neuropathy: no Coronary artery disease: no Stroke: no  Relevant comorbidities and cardiovascular risk factors: Obesity: yes Body mass index is 35.88 kg/m.  Hypertension: Yes  Hyperlipidemia : Yes, on statin   Current / Home Diabetic regimen includes:  Tresiba  40 units daily at bedtime. Jardiance  25 mg daily. Mounjaro  10 mg weekly.  Patient mentioned that he talked about his Mounjaro  and diabetic retinopathy with his ophthalmologist, okayed to continue Mounjaro .  Prior diabetic medications: Metformin  GI intolerance/ headache.  Glipizide  Victoza , switch to Mounjaro  around November/December timeframe of 2024. He had  taken Levemir  with NovoLog  /Humalog  mix in the past.  Glycemic data:   He forgot to bring glucometer in the clinic today to review glucose data.  He reports has not been checking blood sugar much lately.  Hypoglycemia: Patient has no hypoglycemic episodes. Patient has hypoglycemia awareness  Factors modifying glucose control: 1.  Diabetic diet assessment: 3 meals a day.  2.  Staying active or exercising:   3.  Medication compliance: compliant all of the time.  Interval history  Hemoglobin A1c is down to 7.7%.  Patient reportedly has not been as good on diet and also has not been checking blood sugar lately.  He has been taking Mounjaro  10 mg weekly and tolerating well.  No GI issues.  He gained about 10 pounds of weight in the last 4 months.  He has been following with retina specialist and reports treatment has been going well.  No other  complaints today.  No numbness and tingling of the feet.  Reports he is eating is variable sometimes he eats once a day and sometimes multiple times a day.  REVIEW OF SYSTEMS As per history of present illness.   PAST MEDICAL HISTORY: Past Medical History:  Diagnosis Date   Back pain    Borderline high blood pressure    Diabetes 1.5, managed as type 2 (HCC)    Diabetes mellitus    Multiple food allergies    Sleep apnea  PAST SURGICAL HISTORY: History reviewed. No pertinent surgical history.  ALLERGIES: Allergies  Allergen Reactions   Almond (Diagnostic) Itching   Banana Itching    FAMILY HISTORY:  Family History  Problem Relation Age of Onset   Diabetes Mother    Diabetes Brother    Diabetes Brother    Diabetes Brother    Diabetes Maternal Grandmother     SOCIAL HISTORY: Social History   Socioeconomic History   Marital status: Married    Spouse name: Aaron Woods   Number of children: 3   Years of education: Not on file   Highest education level: High school graduate  Occupational History   Not on file  Tobacco Use    Smoking status: Never   Smokeless tobacco: Never  Substance and Sexual Activity   Alcohol use: No   Drug use: No   Sexual activity: Not on file  Other Topics Concern   Not on file  Social History Narrative   Live at home with wife and 3 kids   Right handed   Caffeine: 1 cup of tea in the AM   Social Drivers of Health   Financial Resource Strain: Not on file  Food Insecurity: Not on file  Transportation Needs: Not on file  Physical Activity: Not on file  Stress: Not on file  Social Connections: Not on file    MEDICATIONS:  Current Outpatient Medications  Medication Sig Dispense Refill   acetaminophen  (TYLENOL ) 325 MG tablet Take 650 mg by mouth every 6 (six) hours as needed for mild pain (pain score 1-3) or headache.     BD PEN NEEDLE SHORT ULTRAFINE 31G X 8 MM MISC USE 1 PEN NEEDLE AS DIRECTED 1-4  TIMES  DAILY 100 each 0   blood glucose meter kit and supplies by Other route as directed. Dispense based on patient and insurance preference. Use up to four times daily as directed. (FOR ICD-10 E10.9, E11.9).     Blood Glucose Monitoring Suppl (ONETOUCH VERIO FLEX SYSTEM) w/Device KIT USE TO CHECK GLUCOSE ONCE DAILY IN THE AFTERNOON 1 kit 3   empagliflozin  (JARDIANCE ) 25 MG TABS tablet Take 1 tablet (25 mg total) by mouth daily before breakfast. 90 tablet 3   insulin  degludec (TRESIBA  FLEXTOUCH) 200 UNIT/ML FlexTouch Pen Inject 40 Units into the skin daily in the afternoon. 15 mL 6   MOUNJARO  10 MG/0.5ML Pen INJECT 10 MG SUBCUTANEOUSLY ONCE A WEEK 4 mL 0   OneTouch Delica Lancets 30G MISC USE 1  TO CHECK GLUCOSE ONCE DAILY 100 each 0   ONETOUCH VERIO test strip USE 1 STRIP TO CHECK GLUCOSE ONCE DAILY IN  THE  AFTERNOON 100 each 0   rosuvastatin  (CRESTOR ) 20 MG tablet Take 1 tablet by mouth once daily 90 tablet 0   telmisartan -hydrochlorothiazide (MICARDIS  HCT) 80-12.5 MG tablet Take 1 tablet by mouth daily. 90 tablet 0   triamcinolone  (NASACORT ) 55 MCG/ACT AERO nasal inhaler Place 2  sprays into the nose daily. 1 each 12   Vitamin D , Ergocalciferol , (DRISDOL ) 1.25 MG (50000 UNIT) CAPS capsule Take 1 capsule (50,000 Units total) by mouth every 7 (seven) days. 5 capsule 0   No current facility-administered medications for this visit.    PHYSICAL EXAM: Vitals:   11/24/23 1347  BP: 112/70  Pulse: 75  Resp: 20  SpO2: 96%  Weight: 236 lb (107 kg)  Height: 5' 8 (1.727 m)    Body mass index is 35.88 kg/m.  Wt Readings from Last 3 Encounters:  11/24/23  236 lb (107 kg)  09/14/23 234 lb (106.1 kg)  07/30/23 227 lb (103 kg)    General: Well developed, well nourished male in no apparent distress.  HEENT: AT/[place], no external lesions.  Eyes: Conjunctiva clear and no icterus. Neck: Neck supple  Lungs: Respirations not labored Neurologic: Alert, oriented, normal speech Extremities / Skin: Dry. No sores or rashes noted.  Psychiatric: Does not appear depressed or anxious  Diabetic Foot Exam - Simple   No data filed    LABS Reviewed Lab Results  Component Value Date   HGBA1C 7.7 (A) 11/24/2023   HGBA1C 7.4 (A) 07/23/2023   HGBA1C 7.8 (A) 04/22/2023   No results found for: FRUCTOSAMINE Lab Results  Component Value Date   CHOL 176 01/07/2023   HDL 42 01/07/2023   LDLCALC 115 (H) 01/07/2023   TRIG 102 01/07/2023   CHOLHDL 4 03/07/2022   Lab Results  Component Value Date   MICRALBCREAT <5 01/07/2023   Lab Results  Component Value Date   CREATININE 1.34 (H) 01/07/2023   Lab Results  Component Value Date   GFR 70.88 03/07/2022    ASSESSMENT / PLAN  1. 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 - Diabetic status / severity: Uncontrolled.  Lab Results  Component Value Date   HGBA1C 7.7 (A) 11/24/2023    - Hemoglobin A1c goal : <6.5%  Discussed about limiting carbohydrate  and portion.  He reports his eating is variable sometimes once a day and sometimes multiple times a day.  Will not increase the dose of insulin .  Will increase Mounjaro  as follows.  - Medications: See below.  No change this time.  I) continue Tresiba  40 units daily at bedtime. II) increase Mounjaro  from 10 to 12.5 mg weekly.  Asked patient to call our clinic after a month if no GI issues will increase to 15 mg weekly inpatient visits. III) continue Jardiance  25 mg daily.   - Home glucose testing: Check in the morning fasting and at bedtime.  Discussed about continuous glucose monitoring, patient is not interested at this time.  Asked to bring glucometer in the follow-up visit.  - Discussed/ Gave Hypoglycemia treatment plan.  # Consult : not required at this time.   # Annual urine for microalbuminuria/ creatinine ratio, no microalbuminuria currently, continue ACE/ARB /telmisartan .  Will check today. Last  Lab Results  Component Value Date   MICRALBCREAT <5 01/07/2023    # Foot check nightly.  # He has diabetic retinopathy, following with ophthalmology/retina specialist.   - Diet: Make healthy diabetic food choices - Life style / activity / exercise: Discussed.  2. Blood pressure  -  BP Readings from Last 1 Encounters:  11/24/23 112/70    - Control is in target.  - No change in current plans.  3. Lipid status / Hyperlipidemia - Last  Lab Results  Component Value Date   LDLCALC 115 (H) 01/07/2023   - Continue rosuvastatin  20 mg daily, managed by primary care provider.  Diagnoses and all orders for this visit:  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, with long-term current use of insulin  (HCC) -     POCT glycosylated hemoglobin (Hb A1C)    DISPOSITION Follow up in clinic in 4 months suggested.   All questions answered and patient verbalized understanding  of the plan.  Aaron Aaron Yontz, MD [facility]  Endocrinology [facility] [address] [place], [address] [place], [place] [address] Phone # [phone]  At least part of this note was generated using voice recognition software. Inadvertent word errors may have occurred, which were not recognized during the proofreading process.

## 2023-11-25 LAB — MICROALBUMIN / CREATININE URINE RATIO
Creatinine, Urine: 155 mg/dL (ref 20–320)
Microalb Creat Ratio: 24 mg/g{creat} (ref <30)
Microalb, Ur: 3.7 mg/dL

## 2023-12-09 ENCOUNTER — Other Ambulatory Visit: Payer: Self-pay | Admitting: Endocrinology

## 2023-12-17 NOTE — Progress Notes (Signed)
 [facility] [address] [place]. [place] [address] [phone]       OFFICE FOLLOW UP NOTE  [name] Date of Birth:  08-06-1985 Medical Record Number:  [id number]    Primary neurologist: Dr. Chalice Reason for visit: CPAP follow-up    SUBJECTIVE:   CHIEF COMPLAINT:  Chief Complaint  Patient presents with   Obstructive Sleep Apnea    RM3. Pt is here Alone. Pt states that things have been going well with his CPAP Machine.     Follow-up visit:  Prior visit: 07/07/2022 with Dr. Chalice  Brief HPI:   Aaron Woods is a 38 y.o. male who is followed for OSA on CPAP.  HST 02/2021 showed presence of severe sleep apnea with total AHI 39.7/h and O2 nadir of 51% indicating nocturnal hypoxemia.  Set up with Luna machine 03/2021.  At prior visit with Dr. Chalice, noted adequate nightly usage but suboptimal greater than 4-hour usage due to issues with nasal congestion and toothache.  He did report overall tolerating nasal mask better than fullface mask.  He was prescribed additional nasal spray and advised to call office in 30 days to repeat download with hopeful improved compliance.  ESS 5/24.     Interval history:  Patient returns for overdue CPAP compliance visit.  Reports overall is doing well with CPAP but has had difficulty using consistently over the last month due to allergies and increased congestion.  Reports fall time is usually worse for his allergies.  He previously used a nasal spray and was prescribed Nasacort  by Dr. Chalice at prior visit but does not recall if this was beneficial.  He is not currently using nasal spray or daily antihistamine, he will occasionally use Benadryl at night.  Continues to use nasal pillow with chinstrap.  ESS 6/24.  Routinely follows with DME [facility].       ROS:   14 system review of systems performed and negative with exception of those listed in HPI  PMH:  Past Medical History:  Diagnosis  Date   Back pain    Borderline high blood pressure    Diabetes 1.5, managed as type 2 (HCC)    Diabetes mellitus    Multiple food allergies    Sleep apnea     PSH: History reviewed. No pertinent surgical history.  Social History:  Social History   Socioeconomic History   Marital status: Married    Spouse name: Aaron Woods   Number of children: 3   Years of education: Not on file   Highest education level: High school graduate  Occupational History   Not on file  Tobacco Use   Smoking status: Never   Smokeless tobacco: Never  Substance and Sexual Activity   Alcohol use: No   Drug use: No   Sexual activity: Not on file  Other Topics Concern   Not on file  Social History Narrative   Live at home with wife and 3 kids   Right handed   Caffeine: 1 cup of tea in the AM   Social Drivers of Health   Financial Resource Strain: Not on file  Food Insecurity: Not on file  Transportation Needs: Not on file  Physical Activity: Not on file  Stress: Not on file  Social Connections: Not on file  Intimate Partner Violence: Not on file    Family History:  Family History  Problem Relation Age of Onset   Diabetes Mother    Diabetes Brother  Diabetes Brother    Diabetes Brother    Diabetes Maternal Grandmother     Medications:   Current Outpatient Medications on File Prior to Visit  Medication Sig Dispense Refill   acetaminophen  (TYLENOL ) 325 MG tablet Take 650 mg by mouth every 6 (six) hours as needed for mild pain (pain score 1-3) or headache.     BD PEN NEEDLE SHORT ULTRAFINE 31G X 8 MM MISC USE 1 PEN NEEDLE AS DIRECTED 1-4  TIMES  DAILY 100 each 0   blood glucose meter kit and supplies by Other route as directed. Dispense based on patient and insurance preference. Use up to four times daily as directed. (FOR ICD-10 E10.9, E11.9).     Blood Glucose Monitoring Suppl (ONETOUCH VERIO FLEX SYSTEM) w/Device KIT USE TO CHECK GLUCOSE ONCE DAILY IN THE AFTERNOON 1 kit 3    empagliflozin  (JARDIANCE ) 25 MG TABS tablet Take 1 tablet (25 mg total) by mouth daily before breakfast. 90 tablet 3   insulin  degludec (TRESIBA  FLEXTOUCH) 200 UNIT/ML FlexTouch Pen Inject 40 Units into the skin daily in the afternoon. 15 mL 6   OneTouch Delica Lancets 30G MISC USE 1  TO CHECK GLUCOSE ONCE DAILY 100 each 0   ONETOUCH VERIO test strip USE 1 STRIP TO CHECK GLUCOSE ONCE DAILY IN  THE  AFTERNOON 100 each 0   rosuvastatin  (CRESTOR ) 20 MG tablet Take 1 tablet by mouth once daily 90 tablet 0   telmisartan -hydrochlorothiazide (MICARDIS  HCT) 80-12.5 MG tablet Take 1 tablet by mouth daily. 90 tablet 0   tirzepatide  (MOUNJARO ) 10 MG/0.5ML Pen INJECT 10 MG SUBCUTANEOUSLY ONCE A WEEK 6 mL 3   Vitamin D , Ergocalciferol , (DRISDOL ) 1.25 MG (50000 UNIT) CAPS capsule Take 1 capsule (50,000 Units total) by mouth every 7 (seven) days. 5 capsule 0   tirzepatide  (MOUNJARO ) 12.5 MG/0.5ML Pen Inject 12.5 mg into the skin once a week. (Patient not taking: Reported on 12/21/2023) 2 mL 11   triamcinolone  (NASACORT ) 55 MCG/ACT AERO nasal inhaler Place 2 sprays into the nose daily. (Patient not taking: Reported on 12/21/2023) 1 each 12   No current facility-administered medications on file prior to visit.    Allergies:   Allergies  Allergen Reactions   Almond (Diagnostic) Itching   Banana Itching      OBJECTIVE:  Physical Exam  Vitals:   12/21/23 1409  Weight: 235 lb 9.6 oz (106.9 kg)  Height: 5' 9 (1.753 m)   Body mass index is 34.79 kg/m. No results found.   General: well developed, well nourished, very pleasant middle-age African-American male, seated, in no evident distress Head: head normocephalic and atraumatic.   Neck: supple with no carotid or supraclavicular bruits Cardiovascular: regular rate and rhythm, no murmurs  Neurologic Exam Mental Status: Awake and fully alert. Oriented to place and time. Recent and remote memory intact. Attention span, concentration and fund of  knowledge appropriate. Mood and affect appropriate.  Cranial Nerves: Pupils equal, briskly reactive to light. Extraocular movements full without nystagmus. Visual fields full to confrontation. Hearing intact. Facial sensation intact. Face, tongue, palate moves normally and symmetrically.  Motor: Normal bulk and tone. Normal strength in all tested extremity muscles Gait and Station: Arises from chair without difficulty. Stance is normal. Gait demonstrates normal stride length and balance without use of AD.         ASSESSMENT/PLAN: Aaron Woods is a 38 y.o. year old male    OSA on CPAP :  Compliance report shows suboptimal usage due to  increased issues with allergies and congestion. Discussed restart of nasal sprays and daily antihistamine to help improve compliance Continue current pressure settings of 6-18 Discussed importance of nightly usage with ensuring greater than 4 hours nightly for optimal benefit and per insurance purposes.   DME [employer] [facility], continue to follow with DME company for any needed supplies or CPAP related concerns CPAP set up 03/2021, due for new machine 03/2026     Follow up in 1 year (in office per patient request) or call earlier if needed   CC:  PCP: Purcell [name], MD    I personally spent a total of 25 minutes in the care of the patient today including preparing to see the patient, performing a medically appropriate exam/evaluation, counseling and educating, placing orders, and documenting clinical information in the EHR. This is our first time meeting and time has been spent reviewing past medical history and relevant medical records.   Harlene Bogaert, AGNP-BC  [facility] [address] [place], [place] [address]  Phone [phone] Fax [phone] Note: This document was prepared with digital dictation and possible smart phrase technology. Any transcriptional errors that result from this process are  unintentional.

## 2023-12-19 ENCOUNTER — Other Ambulatory Visit: Payer: Self-pay | Admitting: Endocrinology

## 2023-12-21 ENCOUNTER — Ambulatory Visit: Admitting: Adult Health

## 2023-12-21 ENCOUNTER — Encounter: Payer: Self-pay | Admitting: Adult Health

## 2023-12-21 VITALS — BP 112/70 | HR 75 | Ht 69.0 in | Wt 235.6 lb

## 2023-12-21 DIAGNOSIS — G4733 Obstructive sleep apnea (adult) (pediatric): Secondary | ICD-10-CM

## 2023-12-21 NOTE — Patient Instructions (Addendum)
 Your Plan:  Try to ensure you are using CPAP nightly with ensuring greater than 4 hours per night  Restart use of nasal spray to help with congestion and allergies   Continue to follow with your DME company for any needed supplies or CPAP related concerns     Follow up in 1 year or call earlier if needed      Thank you for coming to see us  at [facility]. I hope we have been able to provide you high quality care today.  You may receive a patient satisfaction survey over the next few weeks. We would appreciate your feedback and comments so that we may continue to improve ourselves and the health of our patients.

## 2023-12-24 NOTE — Progress Notes (Signed)
 Community message has been sent to [employer] for pressure and supplies on 12/24/23. DD

## 2024-01-07 ENCOUNTER — Other Ambulatory Visit: Payer: Self-pay | Admitting: Emergency Medicine

## 2024-01-09 ENCOUNTER — Other Ambulatory Visit: Payer: Self-pay | Admitting: Emergency Medicine

## 2024-02-24 ENCOUNTER — Telehealth: Payer: Self-pay | Admitting: Adult Health

## 2024-02-24 NOTE — Telephone Encounter (Signed)
 Wife reports pt will soon be out of his CPAP supplies, she is asking the Rx be sent to DME ([employer])

## 2024-02-29 ENCOUNTER — Other Ambulatory Visit: Payer: Self-pay | Admitting: Medical Genetics

## 2024-03-07 ENCOUNTER — Encounter: Payer: Self-pay | Admitting: Emergency Medicine

## 2024-03-07 ENCOUNTER — Ambulatory Visit: Admitting: Emergency Medicine

## 2024-03-07 VITALS — BP 140/80 | HR 87 | Temp 98.2°F | Ht 69.0 in | Wt 237.0 lb

## 2024-03-07 DIAGNOSIS — E1169 Type 2 diabetes mellitus with other specified complication: Secondary | ICD-10-CM

## 2024-03-07 DIAGNOSIS — I152 Hypertension secondary to endocrine disorders: Secondary | ICD-10-CM

## 2024-03-07 DIAGNOSIS — E785 Hyperlipidemia, unspecified: Secondary | ICD-10-CM | POA: Diagnosis not present

## 2024-03-07 DIAGNOSIS — E66812 Obesity, class 2: Secondary | ICD-10-CM | POA: Diagnosis not present

## 2024-03-07 DIAGNOSIS — Z6835 Body mass index (BMI) 35.0-35.9, adult: Secondary | ICD-10-CM

## 2024-03-07 DIAGNOSIS — E559 Vitamin D deficiency, unspecified: Secondary | ICD-10-CM | POA: Diagnosis not present

## 2024-03-07 DIAGNOSIS — Z794 Long term (current) use of insulin: Secondary | ICD-10-CM | POA: Diagnosis not present

## 2024-03-07 DIAGNOSIS — Z125 Encounter for screening for malignant neoplasm of prostate: Secondary | ICD-10-CM

## 2024-03-07 DIAGNOSIS — Z0001 Encounter for general adult medical examination with abnormal findings: Secondary | ICD-10-CM | POA: Diagnosis not present

## 2024-03-07 DIAGNOSIS — E1159 Type 2 diabetes mellitus with other circulatory complications: Secondary | ICD-10-CM

## 2024-03-07 DIAGNOSIS — G4733 Obstructive sleep apnea (adult) (pediatric): Secondary | ICD-10-CM

## 2024-03-07 DIAGNOSIS — E113413 Type 2 diabetes mellitus with severe nonproliferative diabetic retinopathy with macular edema, bilateral: Secondary | ICD-10-CM

## 2024-03-07 DIAGNOSIS — Z130 Encounter for screening for diseases of the blood and blood-forming organs and certain disorders involving the immune mechanism: Secondary | ICD-10-CM

## 2024-03-07 LAB — CBC WITH DIFFERENTIAL/PLATELET
Basophils Absolute: 0 K/uL (ref 0.0–0.1)
Basophils Relative: 0.7 % (ref 0.0–3.0)
Eosinophils Absolute: 0.1 K/uL (ref 0.0–0.7)
Eosinophils Relative: 1 % (ref 0.0–5.0)
HCT: 37.2 % — ABNORMAL LOW (ref 39.0–52.0)
Hemoglobin: 11.8 g/dL — ABNORMAL LOW (ref 13.0–17.0)
Lymphocytes Relative: 42.8 % (ref 12.0–46.0)
Lymphs Abs: 3.1 K/uL (ref 0.7–4.0)
MCHC: 31.8 g/dL (ref 30.0–36.0)
MCV: 76.7 fl — ABNORMAL LOW (ref 78.0–100.0)
Monocytes Absolute: 0.7 K/uL (ref 0.1–1.0)
Monocytes Relative: 9.5 % (ref 3.0–12.0)
Neutro Abs: 3.4 K/uL (ref 1.4–7.7)
Neutrophils Relative %: 46 % (ref 43.0–77.0)
Platelets: 283 K/uL (ref 150.0–400.0)
RBC: 4.85 Mil/uL (ref 4.22–5.81)
RDW: 14.6 % (ref 11.5–15.5)
WBC: 7.3 K/uL (ref 4.0–10.5)

## 2024-03-07 LAB — LIPID PANEL
Cholesterol: 109 mg/dL (ref 0–200)
HDL: 36 mg/dL — ABNORMAL LOW (ref >39.00)
LDL Cholesterol: 45 mg/dL (ref 0–99)
NonHDL: 73.21
Total CHOL/HDL Ratio: 3
Triglycerides: 141 mg/dL (ref 0.0–149.0)
VLDL: 28.2 mg/dL (ref 0.0–40.0)

## 2024-03-07 LAB — COMPREHENSIVE METABOLIC PANEL WITH GFR
ALT: 35 U/L (ref 0–53)
AST: 21 U/L (ref 0–37)
Albumin: 3.9 g/dL (ref 3.5–5.2)
Alkaline Phosphatase: 69 U/L (ref 39–117)
BUN: 25 mg/dL — ABNORMAL HIGH (ref 6–23)
CO2: 31 meq/L (ref 19–32)
Calcium: 9.3 mg/dL (ref 8.4–10.5)
Chloride: 104 meq/L (ref 96–112)
Creatinine, Ser: 1.17 mg/dL (ref 0.40–1.50)
GFR: 77.85 mL/min (ref >60.00)
Glucose, Bld: 152 mg/dL — ABNORMAL HIGH (ref 70–99)
Potassium: 3.9 meq/L (ref 3.5–5.1)
Sodium: 141 meq/L (ref 135–145)
Total Bilirubin: 0.4 mg/dL (ref 0.2–1.2)
Total Protein: 6.1 g/dL (ref 6.0–8.3)

## 2024-03-07 LAB — VITAMIN D 25 HYDROXY (VIT D DEFICIENCY, FRACTURES): VITD: 18.51 ng/mL — ABNORMAL LOW (ref 30.00–100.00)

## 2024-03-07 LAB — MICROALBUMIN / CREATININE URINE RATIO
Creatinine,U: 68.5 mg/dL
Microalb Creat Ratio: UNDETERMINED mg/g (ref 0.0–30.0)
Microalb, Ur: <0.7 mg/dL

## 2024-03-07 LAB — VITAMIN B12: Vitamin B-12: 478 pg/mL (ref 211–911)

## 2024-03-07 LAB — PSA: PSA: 0.19 ng/mL (ref 0.10–4.00)

## 2024-03-07 LAB — HEMOGLOBIN A1C: Hgb A1c MFr Bld: 8.2 % — ABNORMAL HIGH (ref 4.6–6.5)

## 2024-03-07 MED ORDER — TELMISARTAN-HCTZ 80-12.5 MG PO TABS: 1.0000 | ORAL_TABLET | Freq: Every day | ORAL | 3 refills | Status: AC

## 2024-03-07 MED ORDER — ROSUVASTATIN CALCIUM 20 MG PO TABS: 20.0000 mg | ORAL_TABLET | Freq: Every day | ORAL | 3 refills | Status: AC

## 2024-03-07 NOTE — Assessment & Plan Note (Signed)
Stable.  Sees ophthalmologist on a regular basis.

## 2024-03-07 NOTE — Assessment & Plan Note (Signed)
 BP Readings from Last 3 Encounters:  03/07/24 (!) 140/80  12/21/23 112/70  11/24/23 112/70  Elevated blood pressure reading in the office today. Has been off blood pressure medication for 3 days Restart telmisartan  HCT 80-12.5 mg daily Lab Results  Component Value Date   HGBA1C 7.7 (A) 11/24/2023  Improved hemoglobin A1c continue Tresiba  40 units daily at bedtime. II) continue Mounjaro   10 mg weekly.  III) continue Jardiance  25 mg daily. Cardiovascular risks associated with hypertension and diabetes discussed Diet and nutrition discussed Blood work and urine done today Follow-up in 6 months

## 2024-03-07 NOTE — Assessment & Plan Note (Signed)
 Chronic stable condition Continue rosuvastatin  20 mg daily Lipid profile done today The 10-year ASCVD risk score (Arnett DK, et al., 2019) is: 11.4%   Values used to calculate the score:     Age: 38 years     Clinically relevant sex: Male     Is Non-Hispanic African American: Yes     Diabetic: Yes     Tobacco smoker: No     Systolic Blood Pressure: 140 mmHg     Is BP treated: Yes     HDL Cholesterol: 42 mg/dL     Total Cholesterol: 176 mg/dL

## 2024-03-07 NOTE — Patient Instructions (Signed)
 Health Maintenance, Male  Adopting a healthy lifestyle and getting preventive care are important in promoting health and wellness. Ask your health care provider about:  The right schedule for you to have regular tests and exams.  Things you can do on your own to prevent diseases and keep yourself healthy.  What should I know about diet, weight, and exercise?  Eat a healthy diet    Eat a diet that includes plenty of vegetables, fruits, low-fat dairy products, and lean protein.  Do not eat a lot of foods that are high in solid fats, added sugars, or sodium.  Maintain a healthy weight  Body mass index (BMI) is a measurement that can be used to identify possible weight problems. It estimates body fat based on height and weight. Your health care provider can help determine your BMI and help you achieve or maintain a healthy weight.  Get regular exercise  Get regular exercise. This is one of the most important things you can do for your health. Most adults should:  Exercise for at least 150 minutes each week. The exercise should increase your heart rate and make you sweat (moderate-intensity exercise).  Do strengthening exercises at least twice a week. This is in addition to the moderate-intensity exercise.  Spend less time sitting. Even light physical activity can be beneficial.  Watch cholesterol and blood lipids  Have your blood tested for lipids and cholesterol at 40 years of age, then have this test every 5 years.  You may need to have your cholesterol levels checked more often if:  Your lipid or cholesterol levels are high.  You are older than 40 years of age.  You are at high risk for heart disease.  What should I know about cancer screening?  Many types of cancers can be detected early and may often be prevented. Depending on your health history and family history, you may need to have cancer screening at various ages. This may include screening for:  Colorectal cancer.  Prostate cancer.  Skin cancer.  Lung  cancer.  What should I know about heart disease, diabetes, and high blood pressure?  Blood pressure and heart disease  High blood pressure causes heart disease and increases the risk of stroke. This is more likely to develop in people who have high blood pressure readings or are overweight.  Talk with your health care provider about your target blood pressure readings.  Have your blood pressure checked:  Every 3-5 years if you are 24-52 years of age.  Every year if you are 3 years old or older.  If you are between the ages of 60 and 72 and are a current or former smoker, ask your health care provider if you should have a one-time screening for abdominal aortic aneurysm (AAA).  Diabetes  Have regular diabetes screenings. This checks your fasting blood sugar level. Have the screening done:  Once every three years after age 66 if you are at a normal weight and have a low risk for diabetes.  More often and at a younger age if you are overweight or have a high risk for diabetes.  What should I know about preventing infection?  Hepatitis B  If you have a higher risk for hepatitis B, you should be screened for this virus. Talk with your health care provider to find out if you are at risk for hepatitis B infection.  Hepatitis C  Blood testing is recommended for:  Everyone born from [birth year] through 1967.  Anyone  with known risk factors for hepatitis C.  Sexually transmitted infections (STIs)  You should be screened each year for STIs, including gonorrhea and chlamydia, if:  You are sexually active and are younger than 40 years of age.  You are older than 40 years of age and your health care provider tells you that you are at risk for this type of infection.  Your sexual activity has changed since you were last screened, and you are at increased risk for chlamydia or gonorrhea. Ask your health care provider if you are at risk.  Ask your health care provider about whether you are at high risk for HIV. Your health care provider  may recommend a prescription medicine to help prevent HIV infection. If you choose to take medicine to prevent HIV, you should first get tested for HIV. You should then be tested every 3 months for as long as you are taking the medicine.  Follow these instructions at home:  Alcohol use  Do not drink alcohol if your health care provider tells you not to drink.  If you drink alcohol:  Limit how much you have to 0-2 drinks a day.  Know how much alcohol is in your drink. In the [place] one drink equals one 12 oz bottle of beer (355 mL), one 5 oz glass of wine (148 mL), or one 1 oz glass of hard liquor (44 mL).  Lifestyle  Do not use any products that contain nicotine or tobacco. These products include cigarettes, chewing tobacco, and vaping devices, such as e-cigarettes. If you need help quitting, ask your health care provider.  Do not use street drugs.  Do not share needles.  Ask your health care provider for help if you need support or information about quitting drugs.  General instructions  Schedule regular health, dental, and eye exams.  Stay current with your vaccines.  Tell your health care provider if:  You often feel depressed.  You have ever been abused or do not feel safe at home.  Summary  Adopting a healthy lifestyle and getting preventive care are important in promoting health and wellness.  Follow your health care provider's instructions about healthy diet, exercising, and getting tested or screened for diseases.  Follow your health care provider's instructions on monitoring your cholesterol and blood pressure.  This information is not intended to replace advice given to you by your health care provider. Make sure you discuss any questions you have with your health care provider.  Document Revised: 07/30/2020 Document Reviewed: 07/30/2020  [employer]vier Patient Education  2024 [employer].

## 2024-03-07 NOTE — Assessment & Plan Note (Signed)
Stable.  On CPAP treatment. 

## 2024-03-07 NOTE — Progress Notes (Signed)
 Aaron Woods 38 y.o.   Chief Complaint  Patient presents with   Annual Exam    HISTORY OF PRESENT ILLNESS: This is Woods 38 y.o. male here for annual exam and follow-up of multiple chronic medical conditions including diabetes and hypertension Sees endocrinologist on Woods regular basis Has been off blood pressure medication for 3 days.  Needs new refills. Overall doing well.  Has no complaints or any other medical concerns today.  Last endocrinologist office visit assessment and plan as follows: ASSESSMENT / PLAN   1. 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 - Diabetic status / severity: Uncontrolled.   Recent Labs[] Expand by Default       Lab Results  Component Value Date    HGBA1C 7.7 (Woods) 11/24/2023        - Hemoglobin A1c goal : <6.5%   Discussed about limiting carbohydrate and portion.   He reports his eating is variable sometimes once Woods day and sometimes multiple times Woods day.  Will not increase the dose of insulin .  Will increase Mounjaro  as follows.   - Medications: See below.  No change this time.   I) continue Tresiba  40 units daily at bedtime. II) increase Mounjaro  from 10 to 12.5 mg weekly.  Asked patient to call our clinic after Woods month if no GI issues will increase to 15 mg weekly inpatient visits. III) continue Jardiance  25 mg daily.     - Home glucose testing: Check in the morning fasting and at bedtime.  Discussed about continuous glucose monitoring, patient is not interested at this time.  Asked to bring glucometer in the follow-up visit.   - Discussed/ Gave Hypoglycemia treatment plan.   # Consult : not required at this time.    # Annual urine for microalbuminuria/ creatinine ratio, no microalbuminuria currently, continue ACE/ARB /telmisartan .  Will check today. Last  Recent  Labs       Lab Results  Component Value Date    MICRALBCREAT <5 01/07/2023        # Foot check nightly.   # He has diabetic retinopathy, following with ophthalmology/retina specialist.    - Diet: Make healthy diabetic food choices - Life style / activity / exercise: Discussed.   2. Blood pressure  -     BP Readings from Last 1 Encounters:  11/24/23 112/70    - Control is in target.          - No change in current plans.   3. Lipid status / Hyperlipidemia - Last  Recent Labs       Lab Results  Component Value Date    LDLCALC 115 (H) 01/07/2023      - Continue rosuvastatin  20 mg daily, managed by primary care provider.   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   All questions answered and patient verbalized understanding of the plan.   [name], MD [facility] [address], [address] [place], [place] [address] Phone # [phone]  HPI   Prior to Admission medications  Medication Sig Start Date End Date Taking? Authorizing Provider  acetaminophen  (TYLENOL ) 325 MG tablet Take 650 mg by mouth every 6 (six) hours as needed for mild pain (pain score 1-3) or headache.   Yes [provider]  blood glucose meter kit and supplies by Other route as directed. Dispense based on patient and insurance preference. Use up to four times daily as directed. (FOR ICD-10 E10.9, E11.9).   Yes [provider]  Blood Glucose Monitoring Suppl (ONETOUCH VERIO FLEX SYSTEM) w/Device KIT USE TO CHECK GLUCOSE ONCE DAILY IN THE AFTERNOON 09/14/23  Yes Aaron Woods, Aaron Schanz, MD  EMBECTA PEN NEEDLE ULTRAFINE 31G X 8 MM MISC USE 1 PEN NEEDLE AS DIRECTED 1-4  TIMES  DAILY 01/09/24   Yes [name], Aaron Schanz, MD  empagliflozin  (JARDIANCE ) 25 MG TABS tablet Take 1 tablet (25 mg total) by mouth daily before breakfast. 04/22/23  Yes Thapa, Sudan, MD  insulin  degludec (TRESIBA  FLEXTOUCH) 200 UNIT/ML FlexTouch Pen Inject 40 Units into the skin daily in the afternoon. 04/22/23  Yes Thapa, Sudan, MD  OneTouch Delica Lancets 30G MISC USE 1  TO CHECK GLUCOSE ONCE DAILY 05/10/23  Yes Aaron Woods, Aaron Schanz, MD  [facility] VERIO test strip USE 1 STRIP TO CHECK GLUCOSE ONCE DAILY IN  THE  AFTERNOON 11/02/23  Yes [name], Aaron Schanz, MD  rosuvastatin  (CRESTOR ) 20 MG tablet TAKE 1 TABLET BY MOUTH ONCE DAILY . APPOINTMENT REQUIRED FOR FUTURE REFILLS 01/07/24  Yes Aaron Woods, Aaron Schanz, MD  telmisartan -hydrochlorothiazide (MICARDIS  HCT) 80-12.5 MG tablet Take 1 tablet by mouth daily. 02/12/23  Yes Aaron Shields A, DO  tirzepatide  (MOUNJARO ) 10 MG/0.5ML Pen INJECT 10 MG SUBCUTANEOUSLY ONCE Woods WEEK 12/21/23  Yes Thapa, Sudan, MD  Vitamin D , Ergocalciferol , (DRISDOL ) 1.25 MG (50000 UNIT) CAPS capsule Take 1 capsule (50,000 Units total) by mouth every 7 (seven) days. 09/14/23  Yes Woods, Aaron D, NP  tirzepatide  (MOUNJARO ) 12.5 MG/0.5ML Pen Inject 12.5 mg into the skin once Woods week. Patient not taking: Reported on 03/07/2024 11/24/23   Thapa, Sudan, MD  triamcinolone  (NASACORT ) 55 MCG/ACT AERO nasal inhaler Place 2 sprays into the nose daily. Patient not taking: Reported on 03/07/2024 07/07/22   Woods, Dedra, MD    Allergies[1]  Patient Active Problem List   Diagnosis Date Noted   Vitamin D  deficiency 01/12/2023   Class 2 severe obesity due to excess calories with serious comorbidity and body mass index (BMI) of 35.0 to 35.9 in adult 01/07/2023   OSA (obstructive sleep apnea) 01/07/2023   Low HDL (under 40) 01/07/2023   SOB (shortness of breath) on exertion 01/07/2023   Other fatigue 01/07/2023   OSA on CPAP 04/01/2021   Hypertension associated with diabetes (HCC) 10/12/2020   Microcytic anemia  12/01/2019   Obesity (BMI 30.0-34.9) 12/01/2019   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 No pertinent surgical history.  Social History   Socioeconomic History   Marital status: Married    Spouse name: Toniann   Number of children: 3   Years of  education: Not on file   Highest education level: Bachelor's degree (e.g., BA, AB, BS)  Occupational History   Not on file  Tobacco Use   Smoking status: Never   Smokeless tobacco: Never  Substance and Sexual Activity   Alcohol use: No   Drug use: No   Sexual activity: Not on file  Other Topics Concern   Not on file  Social History Narrative   Live at home with wife and 3 kids   Right handed   Caffeine: 1 cup of tea in the AM   Social Drivers of Health   Tobacco Use: Low Risk (03/07/2024)   Patient History    Smoking Tobacco Use: Never    Smokeless Tobacco Use: Never    Passive Exposure: Not on file  Financial Resource Strain: Medium Risk (03/07/2024)   Overall Financial Resource Strain (CARDIA)    Difficulty of Paying Living Expenses: Somewhat hard  Food Insecurity: No Food Insecurity (03/07/2024)   Epic    Worried About Radiation Protection Practitioner of Food in the Last Year: Never true    Ran Out of Food in the Last Year: Never true  Transportation Needs: No Transportation Needs (03/07/2024)   Epic    Lack of Transportation (Medical): No    Lack of Transportation (Non-Medical): No  Physical Activity: Insufficiently Active (03/07/2024)   Exercise Vital Sign    Days of Exercise per Week: 1 day    Minutes  of Exercise per Session: 30 min  Stress: No Stress Concern Present (03/07/2024)   [employer] of Occupational Health - Occupational Stress Questionnaire    Feeling of Stress: Only Woods little  Social Connections: Moderately Isolated (03/07/2024)   Social Connection and Isolation Panel    Frequency of Communication with Friends and Family: More than three times Woods week    Frequency of Social Gatherings with Friends and Family: Once Woods week    Attends Religious Services: Never    Database Administrator or Organizations: No    Attends Engineer, Structural: Not on file    Marital Status: Married  Catering Manager Violence: Not on file  Depression (PHQ2-9): Low Risk (07/10/2022)   Depression (PHQ2-9)    PHQ-2 Score: 0  Alcohol Screen: Not on file  Housing: High Risk (03/07/2024)   Epic    Unable to Pay for Housing in the Last Year: Yes    Number of Times Moved in the Last Year: 0    Homeless in the Last Year: No  Utilities: Not on file  Health Literacy: Not on file    Family History  Problem Relation Age of Onset   Diabetes Mother    Diabetes Brother    Diabetes Brother    Diabetes Brother    Diabetes Maternal Grandmother      Review of Systems  Constitutional: Negative.  Negative for chills and fever.  HENT: Negative.  Negative for congestion and sore throat.   Respiratory: Negative.  Negative for cough and shortness of breath.   Cardiovascular: Negative.  Negative for chest pain and palpitations.  Gastrointestinal:  Negative for abdominal pain, diarrhea, nausea and vomiting.  Genitourinary: Negative.  Negative for dysuria and hematuria.  Musculoskeletal: Negative.   Skin: Negative.  Negative for rash.  Neurological: Negative.  Negative for dizziness and headaches.  All other systems reviewed and are negative.   Today's Vitals   03/07/24 1400 03/07/24 1405  BP: (!) 140/80 (!) 140/80  Pulse: 87   Temp: 98.2 F (36.8 C)  TempSrc: Oral   SpO2: 98%   Weight:  237 lb (107.5 kg)   Height: 5' 9 (1.753 m)    Body mass index is 35 kg/m.   Physical Exam Vitals reviewed.  Constitutional:      Appearance: Normal appearance.  HENT:     Head: Normocephalic.     Right Ear: Tympanic membrane, ear canal and external ear normal.     Left Ear: Tympanic membrane, ear canal and external ear normal.     Mouth/Throat:     Mouth: Mucous membranes are moist.     Pharynx: Oropharynx is clear.  Eyes:     Extraocular Movements: Extraocular movements intact.     Conjunctiva/sclera: Conjunctivae normal.     Pupils: Pupils are equal, round, and reactive to light.  Cardiovascular:     Rate and Rhythm: Normal rate and regular rhythm.     Pulses: Normal pulses.     Heart sounds: Normal heart sounds.  Pulmonary:     Effort: Pulmonary effort is normal.     Breath sounds: Normal breath sounds.  Abdominal:     Palpations: Abdomen is soft.     Tenderness: There is no abdominal tenderness.  Musculoskeletal:     Right lower leg: No edema.     Left lower leg: No edema.  Skin:    General: Skin is warm and dry.     Capillary Refill: Capillary refill takes less than 2 seconds.  Neurological:     General: No focal deficit present.     Mental Status: He is alert and oriented to person, place, and time.  Psychiatric:        Mood and Affect: Mood normal.        Behavior: Behavior normal.      ASSESSMENT & PLAN: Problem List Items Addressed This Visit       Cardiovascular and Mediastinum   Hypertension associated with diabetes (HCC)   BP Readings from Last 3 Encounters:  03/07/24 (!) 140/80  12/21/23 112/70  11/24/23 112/70  Elevated blood pressure reading in the office today. Has been off blood pressure medication for 3 days Restart telmisartan  HCT 80-12.5 mg daily Lab Results  Component Value Date   HGBA1C 7.7 (Woods) 11/24/2023  Improved hemoglobin A1c continue Tresiba  40 units daily at bedtime. II) continue Mounjaro   10 mg weekly.  III) continue  Jardiance  25 mg daily. Cardiovascular risks associated with hypertension and diabetes discussed Diet and nutrition discussed Blood work and urine done today Follow-up in 6 months         Relevant Medications   rosuvastatin  (CRESTOR ) 20 MG tablet   telmisartan -hydrochlorothiazide (MICARDIS  HCT) 80-12.5 MG tablet   Other Relevant Orders   Microalbumin / creatinine urine ratio   Comprehensive metabolic panel with GFR   CBC with Differential/Platelet   Hemoglobin A1c   Lipid panel   Vitamin B12     Respiratory   OSA on CPAP   Stable. On CPAP treatment.         Endocrine   Dyslipidemia associated with type 2 diabetes mellitus (HCC)   Chronic stable condition Continue rosuvastatin  20 mg daily Lipid profile done today The 10-year ASCVD risk score (Arnett DK, et al., 2019) is: 11.4%   Values used to calculate the score:     Age: 40 years     Clinically relevant sex: Male     Is Non-Hispanic African American: Yes     Diabetic: Yes     Tobacco smoker: No  Systolic Blood Pressure: 140 mmHg     Is BP treated: Yes     HDL Cholesterol: 42 mg/dL     Total Cholesterol: 176 mg/dL       Relevant Medications   rosuvastatin  (CRESTOR ) 20 MG tablet   telmisartan -hydrochlorothiazide (MICARDIS  HCT) 80-12.5 MG tablet   Other Relevant Orders   Microalbumin / creatinine urine ratio   Comprehensive metabolic panel with GFR   CBC with Differential/Platelet   Hemoglobin A1c   Lipid panel   Vitamin B12   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  Sees ophthalmologist on Woods regular basis.      Relevant Medications   rosuvastatin  (CRESTOR ) 20 MG tablet   telmisartan -hydrochlorothiazide (MICARDIS  HCT) 80-12.5 MG tablet   Other Relevant Orders   Microalbumin / creatinine urine ratio   Comprehensive metabolic panel with GFR   CBC with Differential/Platelet    Hemoglobin A1c   Lipid panel   Vitamin B12     Other   Class 2 severe obesity due to excess calories with serious comorbidity and body mass index (BMI) of 35.0 to 35.9 in adult   Diet and nutrition discussed. Advised to decrease amount of daily carbohydrate intake and daily calories and increase amount of plant-based protein in his diet.      Vitamin D  deficiency   Relevant Orders   VITAMIN D  25 Hydroxy (Vit-D Deficiency, Fractures)   Other Visit Diagnoses       Encounter for general adult medical examination with abnormal findings    -  Primary   Relevant Orders   Comprehensive metabolic panel with GFR   CBC with Differential/Platelet   Hemoglobin A1c   Lipid panel   PSA   VITAMIN D  25 Hydroxy (Vit-D Deficiency, Fractures)   Vitamin B12     Screening for prostate cancer       Relevant Orders   PSA     Screening for deficiency anemia       Relevant Orders   CBC with Differential/Platelet     Screening for endocrine, metabolic and immunity disorder       Relevant Orders   Comprehensive metabolic panel with GFR      Modifiable risk factors discussed with patient. Anticipatory guidance according to age provided. The following topics were also discussed: Social Determinants of Health Smoking.  Non-smoker Diet and nutrition Benefits of exercise Cancer family history reviewed Vaccinations review and recommendations Cardiovascular risk assessment and need for blood work The 10-year ASCVD risk score (Arnett DK, et al., 2019) is: 11.4%   Values used to calculate the score:     Age: 31 years     Clinically relevant sex: Male     Is Non-Hispanic African American: Yes     Diabetic: Yes     Tobacco smoker: No     Systolic Blood Pressure: 140 mmHg     Is BP treated: Yes     HDL Cholesterol: 42 mg/dL     Total Cholesterol: 176 mg/dL  Mental health including depression and anxiety Fall and accident prevention  Patient Instructions  Health Maintenance, Male Adopting Woods  healthy lifestyle and getting preventive care are important in promoting health and wellness. Ask your health care provider about: The right schedule for you to have regular tests and exams. Things you can do on your own to prevent diseases and keep yourself healthy. What should I know  about diet, weight, and exercise? Eat Woods healthy diet  Eat Woods diet that includes plenty of vegetables, fruits, low-fat dairy products, and lean protein. Do not eat Woods lot of foods that are high in solid fats, added sugars, or sodium. Maintain Woods healthy weight Body mass index (BMI) is Woods measurement that can be used to identify possible weight problems. It estimates body fat based on height and weight. Your health care provider can help determine your BMI and help you achieve or maintain Woods healthy weight. Get regular exercise Get regular exercise. This is one of the most important things you can do for your health. Most adults should: Exercise for at least 150 minutes each week. The exercise should increase your heart rate and make you sweat (moderate-intensity exercise). Do strengthening exercises at least twice Woods week. This is in addition to the moderate-intensity exercise. Spend less time sitting. Even light physical activity can be beneficial. Watch cholesterol and blood lipids Have your blood tested for lipids and cholesterol at 40 years of age, then have this test every 5 years. You may need to have your cholesterol levels checked more often if: Your lipid or cholesterol levels are high. You are older than 40 years of age. You are at high risk for heart disease. What should I know about cancer screening? Many types of cancers can be detected early and may often be prevented. Depending on your health history and family history, you may need to have cancer screening at various ages. This may include screening for: Colorectal cancer. Prostate cancer. Skin cancer. Lung cancer. What should I know about heart  disease, diabetes, and high blood pressure? Blood pressure and heart disease High blood pressure causes heart disease and increases the risk of stroke. This is more likely to develop in people who have high blood pressure readings or are overweight. Talk with your health care provider about your target blood pressure readings. Have your blood pressure checked: Every 3-5 years if you are 31-77 years of age. Every year if you are 65 years old or older. If you are between the ages of 24 and 67 and are Woods current or former smoker, ask your health care provider if you should have Woods one-time screening for abdominal aortic aneurysm (AAA). Diabetes Have regular diabetes screenings. This checks your fasting blood sugar level. Have the screening done: Once every three years after age 37 if you are at Woods normal weight and have Woods low risk for diabetes. More often and at Woods younger age if you are overweight or have Woods high risk for diabetes. What should I know about preventing infection? Hepatitis B If you have Woods higher risk for hepatitis B, you should be screened for this virus. Talk with your health care provider to find out if you are at risk for hepatitis B infection. Hepatitis C Blood testing is recommended for: Everyone born from [birth year] through 1967. Anyone with known risk factors for hepatitis C. Sexually transmitted infections (STIs) You should be screened each year for STIs, including gonorrhea and chlamydia, if: You are sexually active and are younger than 40 years of age. You are older than 40 years of age and your health care provider tells you that you are at risk for this type of infection. Your sexual activity has changed since you were last screened, and you are at increased risk for chlamydia or gonorrhea. Ask your health care provider if you are at risk. Ask your health care provider about whether you  are at high risk for HIV. Your health care provider may recommend Woods prescription medicine to  help prevent HIV infection. If you choose to take medicine to prevent HIV, you should first get tested for HIV. You should then be tested every 3 months for as long as you are taking the medicine. Follow these instructions at home: Alcohol use Do not drink alcohol if your health care provider tells you not to drink. If you drink alcohol: Limit how much you have to 0-2 drinks Woods day. Know how much alcohol is in your drink. In the [place] one drink equals one 12 oz bottle of beer (355 mL), one 5 oz glass of wine (148 mL), or one 1 oz glass of hard liquor (44 mL). Lifestyle Do not use any products that contain nicotine or tobacco. These products include cigarettes, chewing tobacco, and vaping devices, such as e-cigarettes. If you need help quitting, ask your health care provider. Do not use street drugs. Do not share needles. Ask your health care provider for help if you need support or information about quitting drugs. General instructions Schedule regular health, dental, and eye exams. Stay current with your vaccines. Tell your health care provider if: You often feel depressed. You have ever been abused or do not feel safe at home. Summary Adopting Woods healthy lifestyle and getting preventive care are important in promoting health and wellness. Follow your health care provider's instructions about healthy diet, exercising, and getting tested or screened for diseases. Follow your health care provider's instructions on monitoring your cholesterol and blood pressure. This information is not intended to replace advice given to you by your health care provider. Make sure you discuss any questions you have with your health care provider. Document Revised: 07/30/2020 Document Reviewed: 07/30/2020 Elsevier Patient Education  2024 Elsevier Inc.     Aaron Schaumann, MD [facility]    [1]  Allergies Allergen Reactions   Almond (Diagnostic) Itching   Banana Itching

## 2024-03-07 NOTE — Assessment & Plan Note (Signed)
 Diet and nutrition discussed.  Advised to decrease amount of daily carbohydrate intake and daily calories and increase amount of plant based protein in his diet

## 2024-03-08 ENCOUNTER — Ambulatory Visit: Payer: Self-pay | Admitting: Emergency Medicine

## 2024-03-08 DIAGNOSIS — E559 Vitamin D deficiency, unspecified: Secondary | ICD-10-CM

## 2024-03-08 MED ORDER — VITAMIN D (ERGOCALCIFEROL) 1.25 MG (50000 UNIT) PO CAPS: 50000.0000 [IU] | ORAL_CAPSULE | ORAL | 1 refills | Status: AC

## 2024-03-08 NOTE — Telephone Encounter (Signed)
 Please advise

## 2024-03-08 NOTE — Telephone Encounter (Signed)
 Okay to take weekly high-dose vitamin D  for 2 months

## 2024-03-18 ENCOUNTER — Other Ambulatory Visit: Payer: Self-pay | Admitting: Emergency Medicine

## 2024-03-28 ENCOUNTER — Ambulatory Visit: Admitting: Endocrinology

## 2024-03-30 ENCOUNTER — Other Ambulatory Visit: Payer: Self-pay | Admitting: Emergency Medicine

## 2024-03-30 DIAGNOSIS — Z794 Long term (current) use of insulin: Secondary | ICD-10-CM

## 2024-04-19 ENCOUNTER — Other Ambulatory Visit: Payer: Self-pay | Admitting: Emergency Medicine

## 2024-04-24 ENCOUNTER — Other Ambulatory Visit: Payer: Self-pay | Admitting: Endocrinology

## 2024-04-24 DIAGNOSIS — E113413 Type 2 diabetes mellitus with severe nonproliferative diabetic retinopathy with macular edema, bilateral: Secondary | ICD-10-CM

## 2024-12-20 ENCOUNTER — Ambulatory Visit: Admitting: Adult Health
